# Patient Record
Sex: Female | Born: 1965 | Race: White | Hispanic: No | Smoking: Former smoker
Health system: Southern US, Community
[De-identification: ages and names within clinical notes are randomized; demographics above are authoritative.]

## PROBLEM LIST (undated history)

## (undated) DIAGNOSIS — H539 Unspecified visual disturbance: Secondary | ICD-10-CM

## (undated) DIAGNOSIS — G35 Multiple sclerosis: Secondary | ICD-10-CM

## (undated) DIAGNOSIS — G629 Polyneuropathy, unspecified: Secondary | ICD-10-CM

## (undated) DIAGNOSIS — I10 Essential (primary) hypertension: Secondary | ICD-10-CM

## (undated) DIAGNOSIS — I639 Cerebral infarction, unspecified: Secondary | ICD-10-CM

## (undated) HISTORY — PX: CHOLECYSTECTOMY: SHX55

## (undated) HISTORY — PX: ABDOMINAL HYSTERECTOMY: SHX81

## (undated) HISTORY — PX: APPENDECTOMY: SHX54

## (undated) HISTORY — DX: Essential (primary) hypertension: I10

## (undated) HISTORY — PX: BREAST SURGERY: SHX581

## (undated) HISTORY — DX: Polyneuropathy, unspecified: G62.9

## (undated) HISTORY — DX: Unspecified visual disturbance: H53.9

## (undated) HISTORY — PX: TONSILLECTOMY: SUR1361

## (undated) HISTORY — DX: Cerebral infarction, unspecified: I63.9

---

## 2000-05-03 ENCOUNTER — Emergency Department (HOSPITAL_COMMUNITY): Admission: EM | Admit: 2000-05-03 | Discharge: 2000-05-03 | Payer: Self-pay | Admitting: *Deleted

## 2007-12-11 ENCOUNTER — Ambulatory Visit (HOSPITAL_COMMUNITY): Admission: RE | Admit: 2007-12-11 | Discharge: 2007-12-11 | Payer: Self-pay | Admitting: *Deleted

## 2007-12-11 ENCOUNTER — Encounter (INDEPENDENT_AMBULATORY_CARE_PROVIDER_SITE_OTHER): Payer: Self-pay | Admitting: *Deleted

## 2008-08-16 ENCOUNTER — Emergency Department (HOSPITAL_BASED_OUTPATIENT_CLINIC_OR_DEPARTMENT_OTHER): Admission: EM | Admit: 2008-08-16 | Discharge: 2008-08-16 | Payer: Self-pay | Admitting: Emergency Medicine

## 2008-11-23 ENCOUNTER — Emergency Department (HOSPITAL_BASED_OUTPATIENT_CLINIC_OR_DEPARTMENT_OTHER): Admission: EM | Admit: 2008-11-23 | Discharge: 2008-11-23 | Payer: Self-pay | Admitting: Emergency Medicine

## 2011-02-14 LAB — URINE MICROSCOPIC-ADD ON

## 2011-02-14 LAB — COMPREHENSIVE METABOLIC PANEL
AST: 37 U/L (ref 0–37)
BUN: 17 mg/dL (ref 6–23)
CO2: 24 mEq/L (ref 19–32)
Calcium: 9 mg/dL (ref 8.4–10.5)
Chloride: 103 mEq/L (ref 96–112)
Creatinine, Ser: 0.7 mg/dL (ref 0.4–1.2)
GFR calc non Af Amer: >60 mL/min (ref >60)
Glucose, Bld: 115 mg/dL — ABNORMAL HIGH (ref 70–99)
Total Bilirubin: 1.2 mg/dL (ref 0.3–1.2)

## 2011-02-14 LAB — URINALYSIS, ROUTINE W REFLEX MICROSCOPIC
Ketones, ur: 40 mg/dL — AB
Nitrite: NEGATIVE
Protein, ur: 30 mg/dL — AB
pH: 8 (ref 5.0–8.0)

## 2011-02-14 LAB — CBC
HCT: 43 % (ref 36.0–46.0)
Hemoglobin: 14.5 g/dL (ref 12.0–15.0)
MCHC: 33.7 g/dL (ref 30.0–36.0)
MCV: 97.1 fL (ref 78.0–100.0)
RBC: 4.43 MIL/uL (ref 3.87–5.11)
WBC: 7.3 10*3/uL (ref 4.0–10.5)

## 2011-02-14 LAB — URINE CULTURE: Colony Count: 8000

## 2011-02-14 LAB — DIFFERENTIAL
Basophils Absolute: 0.2 10*3/uL — ABNORMAL HIGH (ref 0.0–0.1)
Eosinophils Relative: 1 % (ref 0–5)
Lymphocytes Relative: 5 % — ABNORMAL LOW (ref 12–46)
Neutrophils Relative %: 89 % — ABNORMAL HIGH (ref 43–77)

## 2011-03-15 NOTE — Op Note (Signed)
Veronica Frazier, Veronica Frazier              ACCOUNT NO.:  [id number]   MEDICAL RECORD NO.:  [id number]          PATIENT TYPE:  AMB   LOCATION:  ENDO                         FACILITY:  [facility]   PHYSICIAN:  Georgiana Spinner, M.D.    DATE OF BIRTH:  1966-01-04   DATE OF PROCEDURE:  12/11/2007  DATE OF DISCHARGE:                               OPERATIVE REPORT   PROCEDURE PERFORMED:  Upper endoscopy.   INDICATIONS:  Gastroesophageal reflux disease.   ANESTHESIA:  Fentanyl 50 mcg and Versed 4 mg.   PROCEDURE:  With the patient mildly sedated in the left lateral  decubitus position, the Pentax videoscopic endoscope was inserted in the  mouth and passed under direct vision through the esophagus which  appeared normal until we reached the distal esophagus and there appeared  to be one short area of Barrett's, photographed and biopsied.  We  subsequently entered into the stomach.  The fundus, body, antrum,  duodenal bulb, and second portion of the duodenum appeared normal. From  this point, the endoscope was slowly withdrawn taking circumferential  views of the duodenal mucosa until the endoscope had been pulled back  into the stomach and placed in retroflexion to view the stomach from  below. The endoscope was straightened and withdrawn taking  circumferential views of the remaining gastric and esophageal mucosa.  The patient's vital signs and pulse oximeter remained stable.  The  patient tolerated the procedure well without apparent complications.   FINDINGS:  Question of Barrett's esophagus versus normal variant under  distended, await biopsy report.  The patient will call me for results  and follow up with me as an outpatient.  Proceed to colonoscopy.           ______________________________  Georgiana Spinner, M.D.     GMO/MEDQ  D:  12/11/2007  T:  12/12/2007  Job:  [id number]

## 2011-03-15 NOTE — Op Note (Signed)
Veronica Frazier, Veronica Frazier              ACCOUNT NO.:  [id number]   MEDICAL RECORD NO.:  [id number]          PATIENT TYPE:  AMB   LOCATION:  ENDO                         FACILITY:  [facility]   PHYSICIAN:  Georgiana Spinner, M.D.    DATE OF BIRTH:  [birth year]   DATE OF PROCEDURE:  12/11/2007  DATE OF DISCHARGE:                               OPERATIVE REPORT   PROCEDURE:  Colonoscopy.   INDICATIONS:  Colon cancer screening, rectal bleeding.   ANESTHESIA:  Fentanyl 50 mcg, Versed 3 mg.   PROCEDURE:  With the patient mildly sedated in the left lateral  decubitus position, the Pentax videoscopic colonoscope was inserted in  the rectum and passed under direct vision to the cecum, identified by  ileocecal valve and appendiceal orifice, both of which were  photographed.  From this point colonoscope was slowly withdrawn taking  circumferential views of colonic mucosa, stopping only in the rectum,  which appeared normal on direct and retroflexed view.  The endoscope was  straightened and withdrawn.  The patient's vital signs and pulse  oximetry remained stable.  The patient tolerated the procedure well  without apparent complications.   FINDINGS:  Negative colonoscopic examination.   PLAN:  Have the patient follow up with me as noted in endoscopic report  and possibly in 5 years as needed.           ______________________________  Georgiana Spinner, M.D.     GMO/MEDQ  D:  12/11/2007  T:  12/12/2007  Job:  [id number]   cc:   Clint Lipps, MD  [facility], [place]

## 2011-05-28 ENCOUNTER — Emergency Department (INDEPENDENT_AMBULATORY_CARE_PROVIDER_SITE_OTHER): Payer: BC Managed Care – PPO

## 2011-05-28 ENCOUNTER — Encounter: Payer: Self-pay | Admitting: *Deleted

## 2011-05-28 ENCOUNTER — Emergency Department (HOSPITAL_BASED_OUTPATIENT_CLINIC_OR_DEPARTMENT_OTHER)
Admission: EM | Admit: 2011-05-28 | Discharge: 2011-05-28 | Disposition: A | Discharge status: Home / Self Care | Payer: BC Managed Care – PPO | Attending: Emergency Medicine | Admitting: Emergency Medicine

## 2011-05-28 DIAGNOSIS — X500XXA Overexertion from strenuous movement or load, initial encounter: Secondary | ICD-10-CM | POA: Insufficient documentation

## 2011-05-28 DIAGNOSIS — R209 Unspecified disturbances of skin sensation: Secondary | ICD-10-CM | POA: Insufficient documentation

## 2011-05-28 DIAGNOSIS — R609 Edema, unspecified: Secondary | ICD-10-CM

## 2011-05-28 DIAGNOSIS — G35 Multiple sclerosis: Secondary | ICD-10-CM | POA: Insufficient documentation

## 2011-05-28 DIAGNOSIS — S93409A Sprain of unspecified ligament of unspecified ankle, initial encounter: Secondary | ICD-10-CM | POA: Insufficient documentation

## 2011-05-28 DIAGNOSIS — M25579 Pain in unspecified ankle and joints of unspecified foot: Secondary | ICD-10-CM

## 2011-05-28 HISTORY — DX: Multiple sclerosis: G35

## 2011-05-28 NOTE — ED Notes (Signed)
Pt states she fell off sidewalk 2 days ago injuring her left ankle. Today stepped in hole and reinjured it.

## 2011-05-28 NOTE — ED Provider Notes (Signed)
History     Chief Complaint  Patient presents with  . Ankle Pain   Patient is a 45 y.o. female presenting with ankle pain. The history is provided by the patient.  Ankle Pain  The incident occurred at home. Associated symptoms include numbness.  patient rolled her left ankle 2 days ago an again today. Pain with walking. No other injuries. She states that she has some numbness in the ankle. Skin intact.   Past Medical History  Diagnosis Date  . Multiple sclerosis     Past Surgical History  Procedure Date  . Abdominal hysterectomy   . Cholecystectomy   . Appendectomy   . Tonsillectomy   . Breast surgery     History reviewed. No pertinent family history.  History  Substance Use Topics  . Smoking status: Never Smoker   . Smokeless tobacco: Not on file  . Alcohol Use: No    OB History    Grav Para Term Preterm Abortions TAB SAB Ect Mult Living                  Review of Systems  Constitutional: Negative for fever and fatigue.  Musculoskeletal:       Ankle injury  Neurological: Positive for numbness. Negative for weakness.    Physical Exam  BP 134/94  Pulse 102  Temp(Src) 98.9 F (37.2 C) (Oral)  Resp 20  Ht 5\' 7"  (1.702 m)  Wt 185 lb (83.915 kg)  BMI 28.97 kg/m2  SpO2 96%  Physical Exam  Vitals reviewed. Constitutional: She appears well-developed and well-nourished.  HENT:  Head: Normocephalic.  Musculoskeletal: Normal range of motion.       Left ankle swelling posterior to lateral maleolus. Skin intact. Not tenderness over head of fibula or base of 5ht metacarpal.   Neurological: She is alert.       Decreased sensation over left ankle laterally and posteriorly.   Skin: Skin is warm.    ED Course  Procedures    Dg Ankle Complete Left  05/28/2011  *RADIOLOGY REPORT*  Clinical Data: Twisting injury.  Pain.  LEFT ANKLE COMPLETE - 3+ VIEW  Comparison: None  Findings: There is lateral soft tissue swelling.  No evidence of fracture or dislocation.   There is a tiny ossicle inferior to the medial malleolus that it is not acute.  IMPRESSION: Lateral soft tissue swelling.  No acute bony injury.  Original Report Authenticated By: Thomasenia Sales, M.D.    MDM Ankle sprain. Xray negative. ASO and ortho follow up as needed. Patient states that she does not need pain meds.       Juliet Rude. Rubin Payor, MD 05/28/11 1739

## 2011-06-11 ENCOUNTER — Encounter (HOSPITAL_BASED_OUTPATIENT_CLINIC_OR_DEPARTMENT_OTHER): Payer: Self-pay | Admitting: *Deleted

## 2011-06-11 ENCOUNTER — Emergency Department (HOSPITAL_BASED_OUTPATIENT_CLINIC_OR_DEPARTMENT_OTHER)
Admission: EM | Admit: 2011-06-11 | Discharge: 2011-06-11 | Disposition: A | Discharge status: Home / Self Care | Payer: BC Managed Care – PPO | Attending: Emergency Medicine | Admitting: Emergency Medicine

## 2011-06-11 ENCOUNTER — Emergency Department (INDEPENDENT_AMBULATORY_CARE_PROVIDER_SITE_OTHER): Payer: BC Managed Care – PPO

## 2011-06-11 DIAGNOSIS — G35 Multiple sclerosis: Secondary | ICD-10-CM

## 2011-06-11 DIAGNOSIS — R51 Headache: Secondary | ICD-10-CM | POA: Insufficient documentation

## 2011-06-11 DIAGNOSIS — R6883 Chills (without fever): Secondary | ICD-10-CM

## 2011-06-11 DIAGNOSIS — R112 Nausea with vomiting, unspecified: Secondary | ICD-10-CM | POA: Insufficient documentation

## 2011-06-11 LAB — COMPREHENSIVE METABOLIC PANEL
ALT: 45 U/L — ABNORMAL HIGH (ref 0–35)
Alkaline Phosphatase: 87 U/L (ref 39–117)
CO2: 24 mEq/L (ref 19–32)
Chloride: 95 mEq/L — ABNORMAL LOW (ref 96–112)
GFR calc Af Amer: >60 mL/min (ref >60)
GFR calc non Af Amer: >60 mL/min (ref >60)
Glucose, Bld: 135 mg/dL — ABNORMAL HIGH (ref 70–99)
Potassium: 4.4 mEq/L (ref 3.5–5.1)
Sodium: 132 mEq/L — ABNORMAL LOW (ref 135–145)

## 2011-06-11 LAB — CBC
Platelets: 169 10*3/uL (ref 150–400)
RBC: 4.73 MIL/uL (ref 3.87–5.11)
WBC: 5.2 10*3/uL (ref 4.0–10.5)

## 2011-06-11 LAB — DIFFERENTIAL
Lymphocytes Relative: 22 % (ref 12–46)
Lymphs Abs: 1.1 10*3/uL (ref 0.7–4.0)
Neutrophils Relative %: 69 % (ref 43–77)

## 2011-06-11 LAB — URINALYSIS, ROUTINE W REFLEX MICROSCOPIC
Bilirubin Urine: NEGATIVE
Ketones, ur: NEGATIVE mg/dL
Nitrite: NEGATIVE
Urobilinogen, UA: 0.2 mg/dL (ref 0.0–1.0)
pH: 6 (ref 5.0–8.0)

## 2011-06-11 MED ORDER — SODIUM CHLORIDE 0.9 % IV SOLN
999.0000 mL | Freq: Once | INTRAVENOUS | Status: AC
  Administered 2011-06-11: 999 mL via INTRAVENOUS

## 2011-06-11 MED ORDER — METOCLOPRAMIDE HCL 5 MG/ML IJ SOLN
10.0000 mg | Freq: Once | INTRAMUSCULAR | Status: AC
  Administered 2011-06-11: 10 mg via INTRAVENOUS
  Filled 2011-06-11: qty 2

## 2011-06-11 MED ORDER — SODIUM CHLORIDE 0.9 % IV SOLN
999.0000 mL | Freq: Once | INTRAVENOUS | Status: AC
  Administered 2011-06-11: 1000 mL via INTRAVENOUS

## 2011-06-11 NOTE — ED Provider Notes (Signed)
History     CSN: [id number] Arrival date & time: 06/11/2011  3:42 AM  Chief Complaint  Patient presents with  . Emesis   HPI Complaints of multiple episodes of vomiting and frontal headache onset 3 days ago treated with Excedrin Migraine last dose 2 AM today with partial relief. maximum temperature 101 yesterday denies abdominal pain denies blurred vision also complains of slight low back pain no other complaints nothing makes symptoms worse symptoms slightly improved with Excedrin Migraine Past Medical History  Diagnosis Date  . Multiple sclerosis     Past Surgical History  Procedure Date  . Abdominal hysterectomy   . Cholecystectomy   . Appendectomy   . Tonsillectomy   . Breast surgery    breast reduction  No family history on file.  History  Substance Use Topics  . Smoking status: Never Smoker   . Smokeless tobacco: Not on file  . Alcohol Use: No    OB History    Grav Para Term Preterm Abortions TAB SAB Ect Mult Living                  Review of Systems  Constitutional: Negative.   HENT: Negative.   Respiratory: Negative.   Cardiovascular: Negative.   Gastrointestinal: Negative.   Musculoskeletal: Positive for back pain.  Skin: Negative.   Neurological: Positive for numbness.       Headache, chronic numbness and weakness in left upper and left lower extremities  Hematological: Negative.   Psychiatric/Behavioral: Negative.     Physical Exam  BP 116/78  Pulse 110  Temp(Src) 99.3 F (37.4 C) (Oral)  Resp 19  SpO2 98%  Physical Exam  Constitutional: She appears well-developed and well-nourished.  HENT:  Head: Normocephalic and atraumatic.  Eyes: Conjunctivae are normal. Pupils are equal, round, and reactive to light.       Fundi not well visualized  Neck: Neck supple. No tracheal deviation present. No thyromegaly present.       No signs of meningitis  Cardiovascular: Normal rate and regular rhythm.   No murmur heard. Pulmonary/Chest: Effort  normal and breath sounds normal.  Abdominal: Soft. Bowel sounds are normal. She exhibits no distension. There is no tenderness.  Musculoskeletal: Normal range of motion. She exhibits no edema and no tenderness.       Wears Velcro splints on left wrist and left ankle  Lymphadenopathy:    She has no cervical adenopathy.  Neurological: She is alert. She has normal reflexes. Coordination normal.       Walks with minimal limp favoring left lower extremity  Skin: Skin is warm and dry. No rash noted.  Psychiatric: She has a normal mood and affect.    ED Course  Procedures  MDM No signs of meningitis on physical exam at 6:15 AM patient feels improved is able to drink without nausea or vomiting plan followup PMD as needed      Doug Sou, MD 06/11/11 [phone]

## 2011-06-11 NOTE — ED Notes (Signed)
NS bolus complete. Pt lying in bed sleeping but rouses to voice. Pt is in NAD. Awaiting MD eval.

## 2011-06-11 NOTE — ED Notes (Signed)
Pt c/o N/V, HA and chills x3 days.

## 2011-06-13 ENCOUNTER — Encounter (HOSPITAL_BASED_OUTPATIENT_CLINIC_OR_DEPARTMENT_OTHER): Payer: Self-pay | Admitting: *Deleted

## 2011-06-13 ENCOUNTER — Emergency Department (HOSPITAL_BASED_OUTPATIENT_CLINIC_OR_DEPARTMENT_OTHER)
Admission: EM | Admit: 2011-06-13 | Discharge: 2011-06-13 | Disposition: A | Discharge status: Home / Self Care | Payer: BC Managed Care – PPO | Attending: Emergency Medicine | Admitting: Emergency Medicine

## 2011-06-13 ENCOUNTER — Emergency Department (INDEPENDENT_AMBULATORY_CARE_PROVIDER_SITE_OTHER): Payer: BC Managed Care – PPO

## 2011-06-13 DIAGNOSIS — B09 Unspecified viral infection characterized by skin and mucous membrane lesions: Secondary | ICD-10-CM | POA: Insufficient documentation

## 2011-06-13 DIAGNOSIS — R112 Nausea with vomiting, unspecified: Secondary | ICD-10-CM

## 2011-06-13 DIAGNOSIS — R509 Fever, unspecified: Secondary | ICD-10-CM

## 2011-06-13 DIAGNOSIS — R05 Cough: Secondary | ICD-10-CM

## 2011-06-13 DIAGNOSIS — E860 Dehydration: Secondary | ICD-10-CM

## 2011-06-13 DIAGNOSIS — G35 Multiple sclerosis: Secondary | ICD-10-CM | POA: Insufficient documentation

## 2011-06-13 DIAGNOSIS — R51 Headache: Secondary | ICD-10-CM

## 2011-06-13 DIAGNOSIS — R5381 Other malaise: Secondary | ICD-10-CM

## 2011-06-13 LAB — URINALYSIS, ROUTINE W REFLEX MICROSCOPIC
Leukocytes, UA: NEGATIVE
Nitrite: NEGATIVE
Specific Gravity, Urine: 1.013 (ref 1.005–1.030)
pH: 6.5 (ref 5.0–8.0)

## 2011-06-13 LAB — DIFFERENTIAL
Eosinophils Absolute: 0.1 10*3/uL (ref 0.0–0.7)
Eosinophils Relative: 1 % (ref 0–5)
Lymphocytes Relative: 31 % (ref 12–46)
Monocytes Absolute: 0.4 10*3/uL (ref 0.1–1.0)
Neutrophils Relative %: 60 % (ref 43–77)

## 2011-06-13 LAB — COMPREHENSIVE METABOLIC PANEL
AST: 56 U/L — ABNORMAL HIGH (ref 0–37)
Albumin: 3.6 g/dL (ref 3.5–5.2)
Calcium: 10 mg/dL (ref 8.4–10.5)
Creatinine, Ser: 0.7 mg/dL (ref 0.50–1.10)

## 2011-06-13 LAB — CBC
MCH: 32.9 pg (ref 26.0–34.0)
MCHC: 34.3 g/dL (ref 30.0–36.0)
Platelets: 163 10*3/uL (ref 150–400)
RBC: 4.28 MIL/uL (ref 3.87–5.11)

## 2011-06-13 LAB — RAPID STREP SCREEN (MED CTR MEBANE ONLY): Streptococcus, Group A Screen (Direct): NEGATIVE

## 2011-06-13 LAB — LACTIC ACID, PLASMA: Lactic Acid, Venous: 0.9 mmol/L (ref 0.5–2.2)

## 2011-06-13 MED ORDER — SODIUM CHLORIDE 0.9 % IV SOLN
INTRAVENOUS | Status: DC
  Administered 2011-06-13: 03:00:00 via INTRAVENOUS

## 2011-06-13 MED ORDER — PROMETHAZINE HCL 25 MG RE SUPP: 25.0000 mg | Freq: Four times a day (QID) | RECTAL | Status: AC | PRN

## 2011-06-13 MED ORDER — OXYCODONE-ACETAMINOPHEN 5-325 MG PO TABS: 1.0000 | ORAL_TABLET | ORAL | Status: AC | PRN

## 2011-06-13 MED ORDER — ACETAMINOPHEN 500 MG PO TABS
1000.0000 mg | ORAL_TABLET | Freq: Once | ORAL | Status: AC
  Administered 2011-06-13: 1000 mg via ORAL
  Filled 2011-06-13: qty 2

## 2011-06-13 MED ORDER — ONDANSETRON HCL 4 MG/2ML IJ SOLN
4.0000 mg | Freq: Once | INTRAMUSCULAR | Status: AC
  Administered 2011-06-13: 4 mg via INTRAVENOUS
  Filled 2011-06-13: qty 2

## 2011-06-13 MED ORDER — ONDANSETRON HCL 4 MG PO TABS: 4.0000 mg | ORAL_TABLET | Freq: Four times a day (QID) | ORAL | Status: AC

## 2011-06-13 MED ORDER — SODIUM CHLORIDE 0.9 % IV SOLN
Freq: Once | INTRAVENOUS | Status: AC
  Administered 2011-06-13: 02:00:00 via INTRAVENOUS

## 2011-06-13 MED ORDER — PROMETHAZINE HCL 25 MG PO TABS: 25.0000 mg | ORAL_TABLET | Freq: Four times a day (QID) | ORAL | Status: AC | PRN

## 2011-06-13 MED ORDER — MORPHINE SULFATE 4 MG/ML IJ SOLN
4.0000 mg | Freq: Once | INTRAMUSCULAR | Status: AC
  Administered 2011-06-13: 4 mg via INTRAVENOUS
  Filled 2011-06-13: qty 1

## 2011-06-13 NOTE — ED Notes (Signed)
Pt states she was here Friday with similar s/s and had a CT scan and IV. Now has developed cough and feels worse.

## 2011-06-13 NOTE — ED Provider Notes (Addendum)
History     CSN: [id number] Arrival date & time: 06/13/2011  1:36 AM  Chief Complaint  Patient presents with  . Fever   HPI 45 year old female presents from home with complaints of continuing headache, fever, myalgias and a cough. Patient was seen in the ER with headache and reported fever on Friday, 3 days ago. Patient had head CT and laboratories at that time treated with fluids. Patient reports she was feeling better, however the next day she felt too unwell to do anything other than being in bed. Patient reports she has had vomiting, decreased by mouth intake, hot and cold chills. Cough began yesterday is dry and nonproductive. The patient has been taking Tylenol for fever, last taken at 9:30 last night. Patient with recent exposure to viral illness in grand child and daughter. Both of them had fever followed by a rash. Patient reports she noticed onset of blotchy type rash today. Patient with significant history of multiple sclerosis, is on interferon and steroids for same. Patient denies any neck stiffness, no new weakness or numbness, no diarrhea. No urinary symptoms. No abdominal pain. Patient reports slight worsening of her chronic low back pain. Patient reports muscle aches throughout her body. Patient denies any recent tick bites or other known infectious exposures.  Past Medical History  Diagnosis Date  . Multiple sclerosis     Past Surgical History  Procedure Date  . Abdominal hysterectomy   . Cholecystectomy   . Appendectomy   . Tonsillectomy   . Breast surgery     History reviewed. No pertinent family history.  History  Substance Use Topics  . Smoking status: Never Smoker   . Smokeless tobacco: Not on file  . Alcohol Use: No    OB History    Grav Para Term Preterm Abortions TAB SAB Ect Mult Living                  Review of Systems Review of systems negative other than listed in history of present illness  Physical Exam  BP 126/64  Pulse 86  Temp(Src)  100.2 F (37.9 C) (Oral)  Resp 15  Ht 5\' 7"  (1.702 m)  Wt 192 lb (87.091 kg)  BMI 30.07 kg/m2  SpO2 94%  Physical Exam  Nursing note and vitals reviewed. Constitutional: She is oriented to person, place, and time. She appears well-developed. She appears distressed.       Obese, appears to be uncomfortable, anxious  HENT:  Head: Normocephalic and atraumatic.  Right Ear: External ear normal.  Left Ear: External ear normal.  Nose: Nose normal.  Mouth/Throat: Oropharynx is clear and moist.       Patient reports tenderness to palpation of forehead and cheeks bilaterally  Eyes: Conjunctivae and EOM are normal. Pupils are equal, round, and reactive to light. Right eye exhibits no discharge. Left eye exhibits no discharge. No scleral icterus.  Neck: Normal range of motion. Neck supple. No JVD present. No tracheal deviation present. No thyromegaly present.  Cardiovascular: Regular rhythm, normal heart sounds and intact distal pulses.  Exam reveals no gallop and no friction rub.   No murmur heard.      Tachycardia noted. Patient noted to be orthostatic with vitals.  Pulmonary/Chest: Effort normal and breath sounds normal. No stridor. No respiratory distress. She has no wheezes. She has no rales. She exhibits no tenderness.       Dry nonproductive cough noted  Abdominal: Soft. Bowel sounds are normal. She exhibits no distension and no  mass. There is no tenderness. There is no rebound and no guarding.  Musculoskeletal: Normal range of motion. She exhibits tenderness. She exhibits no edema.       Patient with diffuse tenderness to palpation over entire body  Lymphadenopathy:    She has no cervical adenopathy.  Neurological: She is alert and oriented to person, place, and time. She has normal reflexes. She displays normal reflexes. No cranial nerve deficit. She exhibits normal muscle tone. Coordination normal.  Skin: Skin is warm and dry. Rash noted.       Diffuse maculopapular rash noted 2 chest,  arms  Psychiatric:       Anxious, agitated    ED Course  Procedures  Results for orders placed during the hospital encounter of 06/13/11  LACTIC ACID, PLASMA      Component Value Range   Lactic Acid, Venous 0.9  0.5 - 2.2 (mmol/L)  CBC      Component Value Range   WBC 5.3  4.0 - 10.5 (K/uL)   RBC 4.28  3.87 - 5.11 (MIL/uL)   Hemoglobin 14.1  12.0 - 15.0 (g/dL)   HCT 16.1  09.6 - 04.5 (%)   MCV 96.0  78.0 - 100.0 (fL)   MCH 32.9  26.0 - 34.0 (pg)   MCHC 34.3  30.0 - 36.0 (g/dL)   RDW 40.9  81.1 - 91.4 (%)   Platelets 163  150 - 400 (K/uL)  DIFFERENTIAL      Component Value Range   Neutrophils Relative 60  43 - 77 (%)   Lymphocytes Relative 31  12 - 46 (%)   Monocytes Relative 8  3 - 12 (%)   Eosinophils Relative 1  0 - 5 (%)   Basophils Relative 0  0 - 1 (%)   Neutro Abs 3.2  1.7 - 7.7 (K/uL)   Lymphs Abs 1.6  0.7 - 4.0 (K/uL)   Monocytes Absolute 0.4  0.1 - 1.0 (K/uL)   Eosinophils Absolute 0.1  0.0 - 0.7 (K/uL)   Basophils Absolute 0.0  0.0 - 0.1 (K/uL)   WBC Morphology ATYPICAL LYMPHOCYTES     Smear Review LARGE PLATELETS PRESENT    COMPREHENSIVE METABOLIC PANEL      Component Value Range   Sodium 136  135 - 145 (mEq/L)   Potassium 3.8  3.5 - 5.1 (mEq/L)   Chloride 97  96 - 112 (mEq/L)   CO2 27  19 - 32 (mEq/L)   Glucose, Bld 107 (*) 70 - 99 (mg/dL)   BUN 11  6 - 23 (mg/dL)   Creatinine, Ser 7.82  0.50 - 1.10 (mg/dL)   Calcium 95.6  8.4 - 10.5 (mg/dL)   Total Protein 6.9  6.0 - 8.3 (g/dL)   Albumin 3.6  3.5 - 5.2 (g/dL)   AST 56 (*) 0 - 37 (U/L)   ALT 58 (*) 0 - 35 (U/L)   Alkaline Phosphatase 88  39 - 117 (U/L)   Total Bilirubin 0.5  0.3 - 1.2 (mg/dL)   GFR calc non Af Amer >60  >60 (mL/min)   GFR calc Af Amer >60  >60 (mL/min)  LIPASE, BLOOD      Component Value Range   Lipase 32  11 - 59 (U/L)  RAPID STREP SCREEN      Component Value Range   Streptococcus, Group A Screen (Direct) NEGATIVE  NEGATIVE   URINALYSIS, ROUTINE W REFLEX MICROSCOPIC       Component Value Range   Color, Urine YELLOW  YELLOW    Appearance CLEAR  CLEAR    Specific Gravity, Urine 1.013  1.005 - 1.030    pH 6.5  5.0 - 8.0    Glucose, UA NEGATIVE  NEGATIVE (mg/dL)   Hgb urine dipstick NEGATIVE  NEGATIVE    Bilirubin Urine NEGATIVE  NEGATIVE    Ketones, ur NEGATIVE  NEGATIVE (mg/dL)   Protein, ur NEGATIVE  NEGATIVE (mg/dL)   Urobilinogen, UA 0.2  0.0 - 1.0 (mg/dL)   Nitrite NEGATIVE  NEGATIVE    Leukocytes, UA NEGATIVE  NEGATIVE    Dg Chest 2 View  06/13/2011  *RADIOLOGY REPORT*  Clinical Data: Fever, cough, headache, weakness  CHEST - 2 VIEW  Comparison: None.  Findings: The heart size and pulmonary vascularity are normal. The lungs appear clear and expanded without focal air space disease or consolidation. No blunting of the costophrenic angles.  IMPRESSION: No evidence of active pulmonary disease.  Original Report Authenticated By: Marlon Pel, M.D.       MDM 45 year old female with fever, rash with recent exposure to favor his with same. Patient seen for fever and headache on Friday with normal workup at that time. Feel that this is most likely a viral exanthem given her history, but with history of chronic steroid use and interferon will get blood cultures, lactate, chest x-ray, repeat of labs give pain and nausea medicines, antipyretics, and IV fluids. If workup negative will DC to home with nausea medicine and close followup with her primary care doctor.      Olivia Mackie, MD 06/13/11 [id number]  Olivia Mackie, MD 06/13/11 [phone]

## 2011-06-13 NOTE — Discharge Instructions (Signed)
Please alternate tylenol and motrin every 4-6 hours for fever, body aches.  Take nausea medications as needed.  Stick to a bland diet.  Return to the ER for worsening condition or new concerning symptoms.  Follow up with your doctor for recheck in 3-5 days.    B.R.A.T. Diet Your doctor has recommended the B.R.A.T. diet for you or your child until the condition improves. This is often used to help control diarrhea and vomiting symptoms. If you or your child can tolerate clear liquids, you may have:  Bananas.   Rice.   Applesauce.   Toast (and other simple starches such as crackers, potatoes, noodles).  Be sure to avoid dairy products, meats, and fatty foods until symptoms are better. Fruit juices such as apple, grape, and prune juice can make diarrhea worse. Avoid these. Continue this diet for 2 days or as instructed by your caregiver. Document Released: 10/17/2005 Document Re-Released: 08/14/2007 [facility] Patient Information 2011 [place], [place].  Dehydration - Adult Dehydration is the reduction of water and fluid from the body to a level below that required for proper functioning. CAUSES  Throwing up (vomiting).  Diarrhea.   Heat illness.   Fever.   Infections.   Not drinking enough water.  Drinking too much alcohol.   Diabetes.   Use of water pills (diuretics).   SYMPTOMS   Dry mouth.   Thick saliva.   Dizziness.   Weakness.   Fainting.   Decreased urine.  TREATMENT  When dehydration is severe, fluids may be given by intravenous (IV) access or stomach tube.   Medicines to control vomiting or diarrhea may be given.   Sometimes, hospital care is needed to replace water and salt in the body.  Untreated dehydration can lead to shock, severe kidney damage, and death. HOME CARE INSTRUCTIONS  Drink enough water and fluids to keep your urine clear or pale yellow. Ice chips, frozen ice pops, sports drinks, pops or sodas, and fruit juices can all be helpful.    Rest.   Medicine may be needed to control fever or water losses. Only take medicine as directed by your caregiver.   Once your water and salt balance is restored, a regular diet should keep you from getting dehydrated.  SEEK MEDICAL CARE IF:  You have repeated vomiting or diarrhea, severe weakness, or fainting.   You have an oral temperature above 103.   You have belly (abdominal) or chest pain, bloating, or bleeding problems.  MAKE SURE YOU:  Understand these instructions.   Will watch your condition.   Will get help right away if you are not doing well or get worse.  Document Released: 10/17/2005 Document Re-Released: 01/11/2010 [facility] Patient Information 2011 [place], [place].  Viral Exanthems, Adult Many viral infections of the skin are called viral exanthems. Exanthem is another name for a rash or skin eruption. The most common viral exanthem include the following:  [place] measles or rubella.   Measles or rubeola.   Roseola.   Fifth disease.   Chickenpox or varicella.  DIAGNOSIS There are a number of other problems causing a rash that may appear as one of the viral exanthems. Most often your caregiver can determine this by looking at the rash. They have distinct patterns and looks. Lab work may or may not be done if the diagnosis is uncertain. TREATMENT Immunizations have lead to a decrease in the number of cases of measles, mumps and rubella. Viral exanthems may require clinical care by a physician or other caregiver  if other problems or a germ (bacterial) infection follow. The rash may be associated with:  Minor sore throats.   Aches and pains.   Runny noses.   Watery eyes.   Tiredness.   Some coughs.   Gastrointestinal infections causing stomach sickness (nausea), vomiting and diarrhea.  These rashes DO NOT respond to medications that kill germs (antibiotics). HOME CARE INSTRUCTIONS  Only take over-the-counter or prescription medicines for pain,  discomfort, diarrhea or fever as directed by your caregiver.   Drink a lot of clear fluids.  GET HELP IF:  You develop swollen neck glands.   You have an oral temperature above 103.   You develop tenderness over your sinuses.   You are not feeling partly better after 3 days.   You develop muscle aches.   You are feeling more tired than you would expect.   You get a persistent cough with mucus.  SEEK IMMEDIATE MEDICAL CARE IF:  You have an oral temperature above 103, not controlled by medicine.   You develop a sore throat with pus and difficulty swallowing.   You develop neck pain.   You develop a severe headache.   You develop vomiting that will not stop.  Document Released: 01/07/2003 Document Re-Released: 10/05/2009 [facility] Patient Information 2011 [place], [place]  .Fever  Fever is a higher-than-normal body temperature. A normal temperature varies with:  Age.   How it is measured (mouth, underarm, rectal, or ear).   Time of day.  In an adult, an oral temperature around 98.6 Fahrenheit (F) or 37 Celsius (C) is considered normal. A rise in temperature of about 1.8 F or 1 C is generally considered a fever (100.4 F or 38 C). In an infant age 19 days or less, a rectal temperature of 100.33F (38C) generally is regarded as fever. Fever is not a disease but can be a symptom of illness. CAUSES  Fever is most commonly caused by infection.   Some non-infectious problems can cause fever. For example:   Some arthritis problems.   Problems with the thyroid or adrenal glands.   Immune system problems.   Some kinds of cancer.   A reaction to certain medicines.   Occasionally, the source of a fever cannot be determined. This is sometimes called a "Fever of Unknown Origin" (FUO).   Some situations may lead to a temporary rise in body temperature that may go away on its own. Examples are:   Childbirth.   Surgery.   Some situations may cause a rise in body  temperature but these are not considered true fever. Examples are:   Intense exercise.   Dehydration.   Exposure to high outside or room temperatures.  SYMPTOMS  Feeling warm or hot.   Fatigue or feeling exhausted.   Aching all over.   Chills.   Shivering.   Sweats.  DIAGNOSIS  A fever can be suspected by your caregiver feeling that your skin is unusually warm. The fever is confirmed by taking a temperature with a thermometer. Temperatures can be taken different ways. Some methods are accurate and some are not: With adults, adolescents, and children:   An oral temperature is used most commonly.   An ear thermometer will only be accurate if it is positioned as recommended by the manufacturer.   Under the arm temperatures are not accurate and not recommended.   Most electronic thermometers are fast and accurate.  Infants and Toddlers:  Rectal temperatures are recommended and most accurate.   Ear temperatures are  not accurate in this age group and are not recommended.   Skin thermometers are not accurate.  RISKS AND COMPLICATIONS  During a fever, the body uses more oxygen, so a person with a fever may develop rapid breathing or shortness of breath. This can be dangerous especially in people with heart or lung disease.   The sweats that occur following a fever can cause dehydration.   High fever can cause seizures in infants and children.   Older persons can develop confusion during a fever.  TREATMENT  Medications may be used to control temperature.   Do not give aspirin to children with fevers. There is an association with Reye's syndrome. Reyes syndrome is a rare but potentially deadly disease.   If an infection is present and medications have been prescribed, take them as directed. Finish the full course of medications until they are gone.   Sponging or bathing with room-temperature water may help reduce body temperature. Do not use ice water or alcohol  sponge baths.   Do not over-bundle children in blankets or heavy clothes.   Drinking adequate fluids during an illness with fever is important to prevent dehydration.  HOME CARE INSTRUCTIONS  For adults, rest and adequate fluid intake are important. Dress according to how you feel, but do not over-bundle.   Drink enough water and/or fluids to keep your urine clear or pale yellow.   For infants over 3 months and children, giving medication as directed by your caregiver to control fever can help with comfort. The amount to be given is based on the childs weight. Do NOT give more than is recommended.  SEEK MEDICAL CARE IF:  You or your child are unable to keep fluids down.   Vomiting or diarrhea develops.   You develop a skin rash.   An oral temperature above 103 develops, or a fever which persists for over 3 days.   You develop excessive weakness, dizziness, fainting or extreme thirst.   Fevers keep coming back after 3 days.  SEEK IMMEDIATE MEDICAL CARE IF:  Shortness of breath or trouble breathing develops   You pass out.   You feel you are making little or no urine.   New pain develops that was not there before (such as in the head, neck, chest, back, or abdomen).   You cannot hold down fluids.   Vomiting and diarrhea persist for more than a day or two.   You develop a stiff neck and/or your eyes become sensitive to light.   An unexplained temperature above 102 F (38.9 C) develops.  Document Released: 10/17/2005 Document Re-Released: 01/13/2009 [facility] Patient Information 2011 [place], [place].

## 2011-06-13 NOTE — ED Notes (Signed)
MD at bedside. 

## 2011-06-19 LAB — CULTURE, BLOOD (ROUTINE X 2): Culture: NO GROWTH

## 2012-08-08 ENCOUNTER — Emergency Department (HOSPITAL_BASED_OUTPATIENT_CLINIC_OR_DEPARTMENT_OTHER)
Admission: EM | Admit: 2012-08-08 | Discharge: 2012-08-08 | Disposition: A | Discharge status: Home / Self Care | Payer: BC Managed Care – PPO | Attending: Emergency Medicine | Admitting: Emergency Medicine

## 2012-08-08 DIAGNOSIS — M62830 Muscle spasm of back: Secondary | ICD-10-CM

## 2012-08-08 DIAGNOSIS — G35 Multiple sclerosis: Secondary | ICD-10-CM | POA: Insufficient documentation

## 2012-08-08 DIAGNOSIS — M5380 Other specified dorsopathies, site unspecified: Secondary | ICD-10-CM | POA: Insufficient documentation

## 2012-08-08 MED ORDER — CYCLOBENZAPRINE HCL 5 MG PO TABS: 5.0000 mg | ORAL_TABLET | Freq: Three times a day (TID) | ORAL | Status: DC | PRN

## 2012-08-08 NOTE — ED Notes (Signed)
Pt reports sharp pains to right upper back at her bra line. Reports having pain with inspiration and sharp shooting pains down right arm, no history of back problems. Pt does have history of MS. Injury occurred yesterday evening, took hydrocodone last pm for pain and got some relief, her main concern has been difficulty breathing secondary to pain

## 2012-08-08 NOTE — ED Provider Notes (Signed)
History     CSN: [id number]  Arrival date & time 08/08/12  1959   First MD Initiated Contact with Patient 08/08/12 2038      Chief Complaint  Patient presents with  . Back Pain    (Consider location/radiation/quality/duration/timing/severity/associated sxs/prior treatment) HPI CC: Back pain  Back pain: Started yesterday after pulling a large pup up camper. Right sided along bra strap area. Sharp in nature. Started noticing her breathing after the injury. Heating pad, hydrocodone, and sleeping pill last nightw/ improvement. Worsened today after being up on feet running errands. No h/o injury to affected area. Denies any SOB, URI symptoms, fever, n/v/d/c, rash.  Past Medical History  Diagnosis Date  . Multiple sclerosis     Past Surgical History  Procedure Date  . Abdominal hysterectomy   . Cholecystectomy   . Appendectomy   . Tonsillectomy   . Breast surgery     No family history on file.  History  Substance Use Topics  . Smoking status: Never Smoker   . Smokeless tobacco: Not on file  . Alcohol Use: No    OB History    Grav Para Term Preterm Abortions TAB SAB Ect Mult Living                  Review of Systems Per hpi Allergies  Review of patient's allergies indicates no known allergies.  Home Medications   Current Outpatient Rx  Name Route Sig Dispense Refill  . AMANTADINE HCL 100 MG PO CAPS Oral Take 100 mg by mouth 2 (two) times daily.      . ASPIRIN-ACETAMINOPHEN-CAFFEINE 250-250-65 MG PO TABS Oral Take 1 tablet by mouth 3 (three) times daily as needed.      . DULOXETINE HCL 60 MG PO CPEP Oral Take 60 mg by mouth daily.      Marland Kitchen FLUOXETINE HCL 40 MG PO CAPS Oral Take 40 mg by mouth daily.      Marland Kitchen GABAPENTIN 300 MG PO CAPS Oral Take 300 mg by mouth 4 (four) times daily.      . IBUPROFEN 800 MG PO TABS Oral Take 800 mg by mouth once as needed. For pain      . INTERFERON BETA-1A 30 MCG/0.5ML IM KIT Intramuscular Inject 30 mcg into the muscle every 7  (seven) days.      Marland Kitchen LIDOCAINE 5 % EX PTCH Transdermal Place 1 patch onto the skin daily. Remove & Discard patch within 12 hours or as directed by MD; for pain     . MELOXICAM 15 MG PO TABS Oral Take 15 mg by mouth daily.      Marland Kitchen MODAFINIL 200 MG PO TABS Oral Take 100 mg by mouth 2 (two) times daily.      Marygrace Drought WOMENS FORMULA PO Oral Take 1 tablet by mouth daily.        BP 130/81  Pulse 88  Temp 99 F (37.2 C) (Oral)  Resp 16  SpO2 99%  Physical Exam Gen: No distress, obese Musc: point tenderness over R midback just inferior to the scapula. Pain increased on deep inspiration, no effusions Neuro: CN grossly intact, cerebellar function intact  ED Course  Procedures (including critical care time)  Labs Reviewed - No data to display No results found.   No diagnosis found.    MDM  46yo obese female w/ muscle spasm of the R mid back.  -Flexeril RX given - Instructions for message, stretching, heat packs - Handout provided. - Pt to  f/u w/ PCP        Ozella Rocks, MD 08/08/12 2119

## 2012-08-08 NOTE — ED Notes (Signed)
Patient reports that she is having right sided back pain that started yesterday after helping her dad load a camper. Reports pain with inspiration and movement.

## 2012-08-09 NOTE — ED Provider Notes (Signed)
Medical screening examination/treatment/procedure(s) were performed by non-physician practitioner and as supervising physician I was immediately available for consultation/collaboration.    Orest Dygert L Rawan Riendeau, MD 08/09/12 1709 

## 2013-04-06 DIAGNOSIS — G35 Multiple sclerosis: Secondary | ICD-10-CM | POA: Insufficient documentation

## 2014-01-17 ENCOUNTER — Emergency Department (HOSPITAL_BASED_OUTPATIENT_CLINIC_OR_DEPARTMENT_OTHER)
Admission: EM | Admit: 2014-01-17 | Discharge: 2014-01-17 | Disposition: A | Discharge status: Home / Self Care | Payer: BC Managed Care – PPO | Attending: Emergency Medicine | Admitting: Emergency Medicine

## 2014-01-17 ENCOUNTER — Encounter (HOSPITAL_BASED_OUTPATIENT_CLINIC_OR_DEPARTMENT_OTHER): Payer: Self-pay | Admitting: Emergency Medicine

## 2014-01-17 DIAGNOSIS — Z8669 Personal history of other diseases of the nervous system and sense organs: Secondary | ICD-10-CM | POA: Insufficient documentation

## 2014-01-17 DIAGNOSIS — R1110 Vomiting, unspecified: Secondary | ICD-10-CM

## 2014-01-17 DIAGNOSIS — Z791 Long term (current) use of non-steroidal anti-inflammatories (NSAID): Secondary | ICD-10-CM | POA: Insufficient documentation

## 2014-01-17 DIAGNOSIS — R197 Diarrhea, unspecified: Secondary | ICD-10-CM | POA: Insufficient documentation

## 2014-01-17 DIAGNOSIS — Z79899 Other long term (current) drug therapy: Secondary | ICD-10-CM | POA: Insufficient documentation

## 2014-01-17 LAB — URINALYSIS, ROUTINE W REFLEX MICROSCOPIC
GLUCOSE, UA: NEGATIVE mg/dL
Hgb urine dipstick: NEGATIVE
KETONES UR: 15 mg/dL — AB
LEUKOCYTES UA: NEGATIVE
NITRITE: NEGATIVE
PH: 6.5 (ref 5.0–8.0)
Protein, ur: NEGATIVE mg/dL
SPECIFIC GRAVITY, URINE: 1.028 (ref 1.005–1.030)
Urobilinogen, UA: 1 mg/dL (ref 0.0–1.0)

## 2014-01-17 LAB — BASIC METABOLIC PANEL
BUN: 14 mg/dL (ref 6–23)
CHLORIDE: 98 meq/L (ref 96–112)
CO2: 25 meq/L (ref 19–32)
CREATININE: 0.8 mg/dL (ref 0.50–1.10)
Calcium: 9.1 mg/dL (ref 8.4–10.5)
GFR calc non Af Amer: 85 mL/min — ABNORMAL LOW (ref >90)
Glucose, Bld: 127 mg/dL — ABNORMAL HIGH (ref 70–99)
POTASSIUM: 3.6 meq/L — AB (ref 3.7–5.3)
Sodium: 139 mEq/L (ref 137–147)

## 2014-01-17 MED ORDER — SODIUM CHLORIDE 0.9 % IV BOLUS (SEPSIS)
1000.0000 mL | Freq: Once | INTRAVENOUS | Status: AC
  Administered 2014-01-17: 1000 mL via INTRAVENOUS

## 2014-01-17 MED ORDER — ONDANSETRON HCL 4 MG/2ML IJ SOLN
4.0000 mg | Freq: Once | INTRAMUSCULAR | Status: AC
  Administered 2014-01-17: 4 mg via INTRAVENOUS
  Filled 2014-01-17: qty 2

## 2014-01-17 MED ORDER — ONDANSETRON 8 MG PO TBDP: ORAL_TABLET | ORAL | Status: DC

## 2014-01-17 NOTE — ED Notes (Signed)
Pt c/o vomiting and diarrhea x2days, vomit x4 today drinking fluids now with no difficulties, diarrhea x10

## 2014-01-17 NOTE — ED Provider Notes (Signed)
CSN: [id number]     Arrival date & time 01/17/14  0006 History   First MD Initiated Contact with Patient 01/17/14 0022     Chief Complaint  Patient presents with  . Diarrhea     (Consider location/radiation/quality/duration/timing/severity/associated sxs/prior Treatment) Patient is a 48 y.o. female presenting with diarrhea. The history is provided by the patient.  Diarrhea Quality:  Watery Severity:  Severe Onset quality:  Sudden Duration:  1 day Timing:  Intermittent Progression:  Unchanged Relieved by:  Nothing Worsened by:  Nothing tried Ineffective treatments:  None tried Associated symptoms: vomiting   Associated symptoms: no abdominal pain   Vomiting:    Quality:  Stomach contents   Severity:  Moderate   Timing:  Intermittent   Progression:  Unchanged Risk factors: no recent antibiotic use     Past Medical History  Diagnosis Date  . Multiple sclerosis    Past Surgical History  Procedure Laterality Date  . Abdominal hysterectomy    . Cholecystectomy    . Appendectomy    . Tonsillectomy    . Breast surgery     No family history on file. History  Substance Use Topics  . Smoking status: Never Smoker   . Smokeless tobacco: Not on file  . Alcohol Use: No   OB History   Grav Para Term Preterm Abortions TAB SAB Ect Mult Living                 Review of Systems  Gastrointestinal: Positive for vomiting and diarrhea. Negative for abdominal pain.  All other systems reviewed and are negative.      Allergies  Review of patient's allergies indicates no known allergies.  Home Medications   Current Outpatient Rx  Name  Route  Sig  Dispense  Refill  . amantadine (SYMMETREL) 100 MG capsule   Oral   Take 100 mg by mouth 2 (two) times daily.           Marland Kitchen aspirin-acetaminophen-caffeine (EXCEDRIN MIGRAINE) 250-250-65 MG per tablet   Oral   Take 1 tablet by mouth 3 (three) times daily as needed.           . DULoxetine (CYMBALTA) 60 MG capsule   Oral  Take 60 mg by mouth daily.           Marland Kitchen gabapentin (NEURONTIN) 300 MG capsule   Oral   Take 300 mg by mouth 4 (four) times daily.           Marland Kitchen ibuprofen (ADVIL,MOTRIN) 800 MG tablet   Oral   Take 800 mg by mouth once as needed. For pain           . lidocaine (LIDODERM) 5 %   Transdermal   Place 1 patch onto the skin daily. Remove & Discard patch within 12 hours or as directed by MD; for pain          . meloxicam (MOBIC) 15 MG tablet   Oral   Take 15 mg by mouth daily.           . modafinil (PROVIGIL) 200 MG tablet   Oral   Take 100 mg by mouth 2 (two) times daily.           . Multiple Vitamins-Calcium (ONE-A-DAY WOMENS FORMULA PO)   Oral   Take 1 tablet by mouth daily.           . cyclobenzaprine (FLEXERIL) 5 MG tablet   Oral   Take 1-2 tablets (5-10 mg  total) by mouth 3 (three) times daily as needed for muscle spasms. This medication will make you drowsy so use caution while using other sedating medications   30 tablet   0   . FLUoxetine (PROZAC) 40 MG capsule   Oral   Take 40 mg by mouth daily.           . interferon beta-1a (AVONEX PEN) 30 MCG/0.5ML injection   Intramuscular   Inject 30 mcg into the muscle every 7 (seven) days.            BP 122/83  Pulse 98  Temp(Src) 98.2 F (36.8 C) (Oral)  Resp 20  Ht 5\' 7"  (1.702 m)  Wt 200 lb (90.719 kg)  BMI 31.32 kg/m2  SpO2 98% Physical Exam  Constitutional: She is oriented to person, place, and time. She appears well-developed and well-nourished. No distress.  HENT:  Head: Normocephalic and atraumatic.  Mouth/Throat: Oropharynx is clear and moist.  Eyes: Conjunctivae are normal. Pupils are equal, round, and reactive to light.  Neck: Normal range of motion. Neck supple.  Cardiovascular: Normal rate and regular rhythm.   Pulmonary/Chest: Effort normal and breath sounds normal. She has no wheezes. She has no rales.  Abdominal: Soft. Bowel sounds are normal. There is no tenderness. There is no  rebound and no guarding.  Musculoskeletal: Normal range of motion.  Neurological: She is alert and oriented to person, place, and time.  Skin: Skin is warm and dry.  Psychiatric: She has a normal mood and affect.    ED Course  Procedures (including critical care time) Labs Review Labs Reviewed  URINALYSIS, ROUTINE W REFLEX MICROSCOPIC  BASIC METABOLIC PANEL   Imaging Review No results found.   EKG Interpretation None      MDM   Final diagnoses:  None    Medications  sodium chloride 0.9 % bolus 1,000 mL (1,000 mLs Intravenous New Bag/Given 01/17/14 0041)  ondansetron (ZOFRAN) injection 4 mg (4 mg Intravenous Given 01/17/14 0041)   PO challenged successfully symptoms care clearly viral there is no indication for imagine at this time.  Follow up at your family doctor for ongoinng care     Estill Llerena K Rishawn Walck-Rasch, MD 01/17/14 [phone]

## 2014-04-18 DIAGNOSIS — Z0000 Encounter for general adult medical examination without abnormal findings: Secondary | ICD-10-CM | POA: Insufficient documentation

## 2014-04-21 DIAGNOSIS — E785 Hyperlipidemia, unspecified: Secondary | ICD-10-CM | POA: Insufficient documentation

## 2014-09-03 DIAGNOSIS — I10 Essential (primary) hypertension: Secondary | ICD-10-CM | POA: Insufficient documentation

## 2014-11-18 ENCOUNTER — Telehealth: Payer: Self-pay | Admitting: Neurology

## 2014-11-18 NOTE — Telephone Encounter (Signed)
Spoke with Karsen--she c/o recurrent sinus infection, sore throat since Christmas. Sts has been on 2 antibiotics without resolution of infection.  She requested IV SoluMedrol.  Per RAS, I advised she f/u with pcp, possibly ent to determine why she still has sinus infection and best treatment for it.  She verbalized understanding of same/fim

## 2014-11-18 NOTE — Telephone Encounter (Signed)
Patient is calling with severe headaches and fatigue as white blood cell count is extremely low.  Please call at [phone].

## 2014-11-19 ENCOUNTER — Emergency Department (HOSPITAL_BASED_OUTPATIENT_CLINIC_OR_DEPARTMENT_OTHER)
Admission: EM | Admit: 2014-11-19 | Discharge: 2014-11-19 | Disposition: A | Discharge status: Home / Self Care | Payer: BLUE CROSS/BLUE SHIELD | Attending: Emergency Medicine | Admitting: Emergency Medicine

## 2014-11-19 ENCOUNTER — Encounter (HOSPITAL_BASED_OUTPATIENT_CLINIC_OR_DEPARTMENT_OTHER): Payer: Self-pay | Admitting: Emergency Medicine

## 2014-11-19 DIAGNOSIS — Z791 Long term (current) use of non-steroidal anti-inflammatories (NSAID): Secondary | ICD-10-CM | POA: Insufficient documentation

## 2014-11-19 DIAGNOSIS — G35 Multiple sclerosis: Secondary | ICD-10-CM | POA: Insufficient documentation

## 2014-11-19 DIAGNOSIS — G43009 Migraine without aura, not intractable, without status migrainosus: Secondary | ICD-10-CM

## 2014-11-19 DIAGNOSIS — G43909 Migraine, unspecified, not intractable, without status migrainosus: Secondary | ICD-10-CM | POA: Insufficient documentation

## 2014-11-19 DIAGNOSIS — B349 Viral infection, unspecified: Secondary | ICD-10-CM | POA: Diagnosis not present

## 2014-11-19 DIAGNOSIS — Z79899 Other long term (current) drug therapy: Secondary | ICD-10-CM | POA: Insufficient documentation

## 2014-11-19 LAB — URINALYSIS, ROUTINE W REFLEX MICROSCOPIC
BILIRUBIN URINE: NEGATIVE
Glucose, UA: NEGATIVE mg/dL
Hgb urine dipstick: NEGATIVE
Ketones, ur: NEGATIVE mg/dL
Leukocytes, UA: NEGATIVE
NITRITE: NEGATIVE
PH: 7 (ref 5.0–8.0)
Protein, ur: NEGATIVE mg/dL
SPECIFIC GRAVITY, URINE: 1.004 — AB (ref 1.005–1.030)
UROBILINOGEN UA: 0.2 mg/dL (ref 0.0–1.0)

## 2014-11-19 LAB — CBC WITH DIFFERENTIAL/PLATELET
BASOS PCT: 0 % (ref 0–1)
Basophils Absolute: 0 10*3/uL (ref 0.0–0.1)
Eosinophils Absolute: 0.1 10*3/uL (ref 0.0–0.7)
Eosinophils Relative: 1 % (ref 0–5)
HEMATOCRIT: 38.4 % (ref 36.0–46.0)
Hemoglobin: 12.4 g/dL (ref 12.0–15.0)
LYMPHS PCT: 8 % — AB (ref 12–46)
Lymphs Abs: 0.7 10*3/uL (ref 0.7–4.0)
MCH: 31.3 pg (ref 26.0–34.0)
MCHC: 32.3 g/dL (ref 30.0–36.0)
MCV: 97 fL (ref 78.0–100.0)
MONO ABS: 1.4 10*3/uL — AB (ref 0.1–1.0)
MONOS PCT: 15 % — AB (ref 3–12)
NEUTROS ABS: 6.8 10*3/uL (ref 1.7–7.7)
NEUTROS PCT: 76 % (ref 43–77)
Platelets: 255 10*3/uL (ref 150–400)
RBC: 3.96 MIL/uL (ref 3.87–5.11)
RDW: 13.9 % (ref 11.5–15.5)
WBC: 9 10*3/uL (ref 4.0–10.5)

## 2014-11-19 LAB — BASIC METABOLIC PANEL
ANION GAP: 10 (ref 5–15)
BUN: 9 mg/dL (ref 6–23)
CALCIUM: 8.3 mg/dL — AB (ref 8.4–10.5)
CO2: 25 mmol/L (ref 19–32)
Chloride: 98 mEq/L (ref 96–112)
Creatinine, Ser: 0.69 mg/dL (ref 0.50–1.10)
Glucose, Bld: 179 mg/dL — ABNORMAL HIGH (ref 70–99)
POTASSIUM: 3.3 mmol/L — AB (ref 3.5–5.1)
SODIUM: 133 mmol/L — AB (ref 135–145)

## 2014-11-19 LAB — RAPID STREP SCREEN (MED CTR MEBANE ONLY): STREPTOCOCCUS, GROUP A SCREEN (DIRECT): NEGATIVE

## 2014-11-19 MED ORDER — TOBRAMYCIN 0.3 % OP OINT: 1.0000 "application " | TOPICAL_OINTMENT | Freq: Three times a day (TID) | OPHTHALMIC | Status: DC

## 2014-11-19 MED ORDER — DEXAMETHASONE SODIUM PHOSPHATE 10 MG/ML IJ SOLN
10.0000 mg | Freq: Once | INTRAMUSCULAR | Status: AC
  Administered 2014-11-19: 10 mg via INTRAVENOUS
  Filled 2014-11-19: qty 1

## 2014-11-19 MED ORDER — SODIUM CHLORIDE 0.9 % IV BOLUS (SEPSIS)
1000.0000 mL | Freq: Once | INTRAVENOUS | Status: AC
  Administered 2014-11-19: 1000 mL via INTRAVENOUS

## 2014-11-19 MED ORDER — DIPHENHYDRAMINE HCL 50 MG/ML IJ SOLN
25.0000 mg | Freq: Once | INTRAMUSCULAR | Status: AC
  Administered 2014-11-19: 25 mg via INTRAVENOUS
  Filled 2014-11-19: qty 1

## 2014-11-19 MED ORDER — KETOROLAC TROMETHAMINE 30 MG/ML IJ SOLN
30.0000 mg | Freq: Once | INTRAMUSCULAR | Status: AC
  Administered 2014-11-19: 30 mg via INTRAVENOUS
  Filled 2014-11-19: qty 1

## 2014-11-19 MED ORDER — METOCLOPRAMIDE HCL 5 MG/ML IJ SOLN
10.0000 mg | Freq: Once | INTRAMUSCULAR | Status: AC
  Administered 2014-11-19: 10 mg via INTRAVENOUS
  Filled 2014-11-19: qty 2

## 2014-11-19 MED ORDER — ONDANSETRON HCL 4 MG/2ML IJ SOLN
4.0000 mg | Freq: Once | INTRAMUSCULAR | Status: AC
  Administered 2014-11-19: 4 mg via INTRAVENOUS
  Filled 2014-11-19: qty 2

## 2014-11-19 MED ORDER — ONDANSETRON 4 MG PO TBDP: ORAL_TABLET | ORAL | Status: DC

## 2014-11-19 NOTE — Discharge Instructions (Signed)
Migraine Headache °A migraine headache is an intense, throbbing pain on one or both sides of your head. A migraine can last for 30 minutes to several hours. °CAUSES  °The exact cause of a migraine headache is not always known. However, a migraine may be caused when nerves in the brain become irritated and release chemicals that cause inflammation. This causes pain. °Certain things may also trigger migraines, such as: °· Alcohol. °· Smoking. °· Stress. °· Menstruation. °· Aged cheeses. °· Foods or drinks that contain nitrates, glutamate, aspartame, or tyramine. °· Lack of sleep. °· Chocolate. °· Caffeine. °· Hunger. °· Physical exertion. °· Fatigue. °· Medicines used to treat chest pain (nitroglycerine), birth control pills, estrogen, and some blood pressure medicines. °SIGNS AND SYMPTOMS °· Pain on one or both sides of your head. °· Pulsating or throbbing pain. °· Severe pain that prevents daily activities. °· Pain that is aggravated by any physical activity. °· Nausea, vomiting, or both. °· Dizziness. °· Pain with exposure to bright lights, loud noises, or activity. °· General sensitivity to bright lights, loud noises, or smells. °Before you get a migraine, you may get warning signs that a migraine is coming (aura). An aura may include: °· Seeing flashing lights. °· Seeing bright spots, halos, or zigzag lines. °· Having tunnel vision or blurred vision. °· Having feelings of numbness or tingling. °· Having trouble talking. °· Having muscle weakness. °DIAGNOSIS  °A migraine headache is often diagnosed based on: °· Symptoms. °· Physical exam. °· A CT scan or MRI of your head. These imaging tests cannot diagnose migraines, but they can help rule out other causes of headaches. °TREATMENT °Medicines may be given for pain and nausea. Medicines can also be given to help prevent recurrent migraines.  °HOME CARE INSTRUCTIONS °· Only take over-the-counter or prescription medicines for pain or discomfort as directed by your  health care provider. The use of long-term narcotics is not recommended. °· Lie down in a dark, quiet room when you have a migraine. °· Keep a journal to find out what may trigger your migraine headaches. For example, write down: °· What you eat and drink. °· How much sleep you get. °· Any change to your diet or medicines. °· Limit alcohol consumption. °· Quit smoking if you smoke. °· Get 7-9 hours of sleep, or as recommended by your health care provider. °· Limit stress. °· Keep lights dim if bright lights bother you and make your migraines worse. °SEEK IMMEDIATE MEDICAL CARE IF:  °· Your migraine becomes severe. °· You have a fever. °· You have a stiff neck. °· You have vision loss. °· You have muscular weakness or loss of muscle control. °· You start losing your balance or have trouble walking. °· You feel faint or pass out. °· You have severe symptoms that are different from your first symptoms. °MAKE SURE YOU:  °· Understand these instructions. °· Will watch your condition. °· Will get help right away if you are not doing well or get worse. °Document Released: 10/17/2005 Document Revised: 03/03/2014 Document Reviewed: 06/24/2013 °[employer]® Patient Information ©2015 [employer]. This information is not intended to replace advice given to you by your health care provider. Make sure you discuss any questions you have with your health care provider. ° °Viral Infections °A viral infection can be caused by different types of viruses. Most viral infections are not serious and resolve on their own. However, some infections may cause severe symptoms and may lead to further complications. °SYMPTOMS °Viruses can   frequently cause: °· Minor sore throat. °· Aches and pains. °· Headaches. °· Runny nose. °· Different types of rashes. °· Watery eyes. °· Tiredness. °· Cough. °· Loss of appetite. °· Gastrointestinal infections, resulting in nausea, vomiting, and diarrhea. °These symptoms do not respond to antibiotics because  the infection is not caused by bacteria. However, you might catch a bacterial infection following the viral infection. This is sometimes called a "superinfection." Symptoms of such a bacterial infection may include: °· Worsening sore throat with pus and difficulty swallowing. °· Swollen neck glands. °· Chills and a high or persistent fever. °· Severe headache. °· Tenderness over the sinuses. °· Persistent overall ill feeling (malaise), muscle aches, and tiredness (fatigue). °· Persistent cough. °· Yellow, green, or brown mucus production with coughing. °HOME CARE INSTRUCTIONS  °· Only take over-the-counter or prescription medicines for pain, discomfort, diarrhea, or fever as directed by your caregiver. °· Drink enough water and fluids to keep your urine clear or pale yellow. Sports drinks can provide valuable electrolytes, sugars, and hydration. °· Get plenty of rest and maintain proper nutrition. Soups and broths with crackers or rice are fine. °SEEK IMMEDIATE MEDICAL CARE IF:  °· You have severe headaches, shortness of breath, chest pain, neck pain, or an unusual rash. °· You have uncontrolled vomiting, diarrhea, or you are unable to keep down fluids. °· You or your child has an oral temperature above 102° F (38.9° C), not controlled by medicine. °· Your baby is older than 3 months with a rectal temperature of 102° F (38.9° C) or higher. °· Your baby is 3 months old or younger with a rectal temperature of 100.4° F (38° C) or higher. °MAKE SURE YOU:  °· Understand these instructions. °· Will watch your condition. °· Will get help right away if you are not doing well or get worse. °Document Released: 07/27/2005 Document Revised: 01/09/2012 Document Reviewed: 02/21/2011 °[employer]® Patient Information ©2015 [employer]. This information is not intended to replace advice given to you by your health care provider. Make sure you discuss any questions you have with your health care provider. ° °

## 2014-11-19 NOTE — ED Notes (Signed)
Pt states that she has had headache, vomiting and diarrhea since sunday

## 2014-11-19 NOTE — ED Notes (Addendum)
Ha onset saturday, n/v vomiting, sorethroat and diarrhea since sunday

## 2014-11-19 NOTE — ED Notes (Signed)
MD at bedside. 

## 2014-11-19 NOTE — ED Provider Notes (Signed)
CSN: [id number]     Arrival date & time 11/19/14  4098 History   First MD Initiated Contact with Patient 11/19/14 [phone]     Chief Complaint  Patient presents with  . Migraine     (Consider location/radiation/quality/duration/timing/severity/associated sxs/prior Treatment) HPI Comments: Patient with a history of MS presents with a migraine headache. She states that her migraines started on Saturday and has gradually gotten worse over the last few days. She states the headache as a throbbing pain to the front of her head. She states it feels similar to her past migraines type headaches. She's had associated nausea and vomiting. She also says her for last 2-3 days she's had a sore throat and dry cough. She denies any nasal congestion. She denies any chest congestion. She's had some vomiting and watery diarrhea. She denies abdominal pain. She denies any fevers or chills. She denies any neck pain. She was recently treated for sinus infection but feels that her sinus infection is better.  Patient is a 49 y.o. female presenting with migraines.  Migraine Associated symptoms include headaches. Pertinent negatives include no chest pain, no abdominal pain and no shortness of breath.    Past Medical History  Diagnosis Date  . Multiple sclerosis    Past Surgical History  Procedure Laterality Date  . Abdominal hysterectomy    . Cholecystectomy    . Appendectomy    . Tonsillectomy    . Breast surgery     History reviewed. No pertinent family history. History  Substance Use Topics  . Smoking status: Never Smoker   . Smokeless tobacco: Not on file  . Alcohol Use: No   OB History    No data available     Review of Systems  Constitutional: Positive for fatigue. Negative for fever, chills and diaphoresis.  HENT: Positive for sore throat. Negative for congestion, rhinorrhea and sneezing.   Eyes: Negative.   Respiratory: Positive for cough. Negative for chest tightness and shortness of breath.    Cardiovascular: Negative for chest pain and leg swelling.  Gastrointestinal: Positive for nausea, vomiting and diarrhea. Negative for abdominal pain and blood in stool.  Genitourinary: Negative for frequency, hematuria, flank pain and difficulty urinating.  Musculoskeletal: Negative for back pain and arthralgias.  Skin: Negative for rash.  Neurological: Positive for headaches. Negative for dizziness, speech difficulty, weakness and numbness.      Allergies  Review of patient's allergies indicates no known allergies.  Home Medications   Prior to Admission medications   Medication Sig Start Date End Date Taking? Authorizing Provider  amantadine (SYMMETREL) 100 MG capsule Take 100 mg by mouth 2 (two) times daily.      Historical Provider, MD  aspirin-acetaminophen-caffeine (EXCEDRIN MIGRAINE) [phone] MG per tablet Take 1 tablet by mouth 3 (three) times daily as needed.      Historical Provider, MD  cyclobenzaprine (FLEXERIL) 5 MG tablet Take 1-2 tablets (5-10 mg total) by mouth 3 (three) times daily as needed for muscle spasms. This medication will make you drowsy so use caution while using other sedating medications 08/08/12   Ozella Rocks, MD  DULoxetine (CYMBALTA) 60 MG capsule Take 60 mg by mouth daily.      Historical Provider, MD  FLUoxetine (PROZAC) 40 MG capsule Take 40 mg by mouth daily.      Historical Provider, MD  gabapentin (NEURONTIN) 300 MG capsule Take 300 mg by mouth 4 (four) times daily.      Historical Provider, MD  ibuprofen (ADVIL,MOTRIN) 800  MG tablet Take 800 mg by mouth once as needed. For pain      Historical Provider, MD  interferon beta-1a (AVONEX PEN) 30 MCG/0.5ML injection Inject 30 mcg into the muscle every 7 (seven) days.      Historical Provider, MD  lidocaine (LIDODERM) 5 % Place 1 patch onto the skin daily. Remove & Discard patch within 12 hours or as directed by MD; for pain     Historical Provider, MD  meloxicam (MOBIC) 15 MG tablet Take 15 mg by  mouth daily.      Historical Provider, MD  modafinil (PROVIGIL) 200 MG tablet Take 100 mg by mouth 2 (two) times daily.      Historical Provider, MD  Multiple Vitamins-Calcium (ONE-A-DAY WOMENS FORMULA PO) Take 1 tablet by mouth daily.      Historical Provider, MD  ondansetron (ZOFRAN ODT) 4 MG disintegrating tablet  ODT q4 hours prn nausea/vomit 11/19/14   Rolan Bucco, MD   BP 128/75 mmHg  Pulse 88  Temp(Src) 99.2 F (37.3 C) (Oral)  Resp 18  Ht  (1.702 m)  Wt 200 lb (90.719 kg)  BMI 31.32 kg/m2  SpO2 95% Physical Exam  Constitutional: She is oriented to person, place, and time. She appears well-developed and well-nourished.  HENT:  Head: Normocephalic and atraumatic.  Right Ear: External ear normal.  Left Ear: External ear normal.  Mouth/Throat: Oropharynx is clear and moist. No oropharyngeal exudate.  Eyes: Pupils are equal, round, and reactive to light.  Neck: Normal range of motion. Neck supple.  No meningismus  Cardiovascular: Normal rate, regular rhythm and normal heart sounds.   Pulmonary/Chest: Effort normal and breath sounds normal. No respiratory distress. She has no wheezes. She has no rales. She exhibits no tenderness.  Abdominal: Soft. Bowel sounds are normal. There is no tenderness. There is no rebound and no guarding.  Musculoskeletal: Normal range of motion. She exhibits no edema.  Lymphadenopathy:    She has no cervical adenopathy.  Neurological: She is alert and oriented to person, place, and time. She has normal strength. No cranial nerve deficit or sensory deficit. GCS eye subscore is 4. GCS verbal subscore is 5. GCS motor subscore is 6.  Skin: Skin is warm and dry. No rash noted.  Psychiatric: She has a normal mood and affect.    ED Course  Procedures (including critical care time) Labs Review Labs Reviewed  URINALYSIS, ROUTINE W REFLEX MICROSCOPIC - Abnormal; Notable for the following:    Specific Gravity, Urine 1.004 (*)    All other  components within normal limits  CBC WITH DIFFERENTIAL - Abnormal; Notable for the following:    Lymphocytes Relative 8 (*)    Monocytes Relative 15 (*)    Monocytes Absolute 1.4 (*)    All other components within normal limits  BASIC METABOLIC PANEL - Abnormal; Notable for the following:    Sodium 133 (*)    Potassium 3.3 (*)    Glucose, Bld 179 (*)    Calcium 8.3 (*)    All other components within normal limits  RAPID STREP SCREEN  CULTURE, GROUP A STREP    Imaging Review No results found.   EKG Interpretation None      MDM   Final diagnoses:  Migraine without aura and without status migrainosus, not intractable  Viral syndrome    Patient presents with a migraine headache as well as some diarrhea and some viral-like symptoms. Her headache is completely relieved after migraine cocktail. Her headache is  consistent with her past migraines type headaches. There is no unusual symptoms that would be more suggestive of subarachnoid hemorrhage or meningitis. She has other symptoms that are more suggestive of a viral syndrome. She has no suggestions of pneumonia. Her labs are unremarkable other than her sodium is slightly low at 133. She was given IV fluid hydration. She does have some watery diarrhea and has recently been on antibiotics. I did advise her if she has an episode of diarrhea in the ED we can send her stool for testing for C. difficile. However she did not have any further episodes of diarrhea while she was in the ED. I encouraged her to follow-up with her primary care physician if her diarrhea continues and she may need to be tested for C. difficile. Return precautions were given.    Rolan Bucco, MD 11/19/14 1026

## 2014-11-21 LAB — CULTURE, GROUP A STREP

## 2014-12-09 ENCOUNTER — Encounter: Payer: Self-pay | Admitting: Neurology

## 2014-12-09 ENCOUNTER — Ambulatory Visit (INDEPENDENT_AMBULATORY_CARE_PROVIDER_SITE_OTHER): Payer: BLUE CROSS/BLUE SHIELD | Admitting: Neurology

## 2014-12-09 VITALS — BP 138/88 | HR 80 | Resp 14 | Ht 67.0 in | Wt 212.0 lb

## 2014-12-09 DIAGNOSIS — F329 Major depressive disorder, single episode, unspecified: Secondary | ICD-10-CM | POA: Insufficient documentation

## 2014-12-09 DIAGNOSIS — F32A Depression, unspecified: Secondary | ICD-10-CM | POA: Insufficient documentation

## 2014-12-09 DIAGNOSIS — G35 Multiple sclerosis: Secondary | ICD-10-CM | POA: Diagnosis not present

## 2014-12-09 DIAGNOSIS — D8989 Other specified disorders involving the immune mechanism, not elsewhere classified: Secondary | ICD-10-CM | POA: Insufficient documentation

## 2014-12-09 DIAGNOSIS — R5382 Chronic fatigue, unspecified: Secondary | ICD-10-CM

## 2014-12-09 DIAGNOSIS — G4700 Insomnia, unspecified: Secondary | ICD-10-CM | POA: Diagnosis not present

## 2014-12-09 DIAGNOSIS — H539 Unspecified visual disturbance: Secondary | ICD-10-CM

## 2014-12-09 DIAGNOSIS — R5383 Other fatigue: Secondary | ICD-10-CM | POA: Insufficient documentation

## 2014-12-09 MED ORDER — HYDROCODONE-ACETAMINOPHEN 5-325 MG PO TABS: 1.0000 | ORAL_TABLET | Freq: Four times a day (QID) | ORAL | Status: DC | PRN

## 2014-12-09 MED ORDER — PHENTERMINE HCL 37.5 MG PO CAPS: 37.5000 mg | ORAL_CAPSULE | ORAL | Status: DC

## 2014-12-09 NOTE — Progress Notes (Signed)
[facility]  PATIENT: Veronica Frazier DOB: 23-Jun-1966  REFERRING CLINICIAN: Harlene Ramus HISTORY FROM: patient REASON FOR VISIT: MS   HISTORICAL  CHIEF COMPLAINT:  Chief Complaint  Patient presents with  . Multiple Sclerosis    Sts. balance and memory are worse.  More falls.  Recently admitted, treated at [facility] for n/v/d, dx with colitis./fim    HISTORY OF PRESENT ILLNESS:  Veronica Frazier is a 49 year old woman who was diagnosed with MS in 2000 after presenting with pain and tingling in the left arm. At that time, she had an MRI of the brain performed that was concerning for multiple sclerosis. Additionally, she had a lumbar puncture performed with CSF that was consistent with MS. Initially, she was placed on Avonex injections weekly. Initially, she did well with the Avonex but then had an MRI that showed some progression. About 3 years ago she switched to Tecfidera. She tolerates Tecfidera fairly well, having just a little bit of flushing  Currently, her main difficulties with MS are decreased balance, visual disturbances, memory issues and fatigue.  Her gait difficulties are more related to balance than to strength. However, she fatigues easily and if she is going to go long distances her family will usually use a wheelchair for her. She had a couple of falls recently but notes that it was on ice. She denies significant numbness or dysesthesia.  She notes visual blurring bilaterally and also has had some diplopia at times. The symptoms are fairly constant and not affected by fatigue or heat.  She denies much difficulties with her bladder. However, she has recently been diagnosed with colitis and has had a lot of bowel difficulties lately. Initially, she had a lot of diarrhea. Now she is having more constipation.  She has noted some cognitive difficulties, especially with short-term memory. She also has had difficulties with verbal fluency and  executive function. She sometimes forgets to finish tasks that she has started.  Mood has been doing worse lately. She has had some crying spells. She remains on duloxetine and fluoxetine. She tolerates these well but they do not seem to be helping as much as they did in the past. She is currently not seeing a psychiatrist or psychologist but have seen them in the past.  She also reports a lot of difficulties with fatigue. She wakes up fatigued and feels even more so as the day goes on. She also has some daytime sleepiness. She falls asleep fairly easily but will wake up after couple hours and then have difficulty falling back asleep. Therefore, her sleep is fragmented into several segments including one nap that add up to about 6 hours every day. She snores. However, her husband has not noted any gasping at night or pauses in her breathing. She is currently on modafinil and amantadine but they have not helped much as they did in the past. He was prescribed lorazepam 1 mg nightly which has helped her to fall asleep and helps him with his sleep maintenance, as well.  She also reports a lot of back pain, mostly midline without radiation to the limbs. Gabapentin and hydrocodone has helped this pain some. She takes the hydrocodone up to 3 times a day but will have some days where she does not take any.  REVIEW OF SYSTEMS:  Constitutional: No fevers, chills, sweats, or change in appetite   She has fatigue Eyes: Blurry vision and rare diplopia.  No eye pain Ear, nose and throat: No  hearing loss, ear pain, nasal congestion, sore throat Cardiovascular: No chest pain, palpitations Respiratory:  No shortness of breath at rest or with exertion.   No wheezes GastrointestinaI: No nausea but recent diarrhea and constipation.    Colitis being treated Genitourinary:  No dysuria, urinary retention or frequency.  No nocturia. Musculoskeletal:  No neck pain.  Mild mid back pain Integumentary: No rash, pruritus, skin  lesions Neurological: as above Psychiatric:Depression.  No anxiety Endocrine: No palpitations, diaphoresis.    Increased hunger and  increased thirst Hematologic/Lymphatic:  No anemia, purpura, petechiae. Allergic/Immunologic: No itchy/runny eyes, nasal congestion, recent allergic reactions, rashes  ALLERGIES: No Known Allergies  HOME MEDICATIONS: Outpatient Prescriptions Prior to Visit  Medication Sig Dispense Refill  . amantadine (SYMMETREL) 100 MG capsule Take 100 mg by mouth 2 (two) times daily.      Marland Kitchen aspirin-acetaminophen-caffeine (EXCEDRIN MIGRAINE) 250-250-65 MG per tablet Take 1 tablet by mouth 3 (three) times daily as needed.      . cyclobenzaprine (FLEXERIL) 5 MG tablet Take 1-2 tablets (5-10 mg total) by mouth 3 (three) times daily as needed for muscle spasms. This medication will make you drowsy so use caution while using other sedating medications 30 tablet 0  . DULoxetine (CYMBALTA) 60 MG capsule Take 60 mg by mouth daily.      Marland Kitchen FLUoxetine (PROZAC) 40 MG capsule Take 40 mg by mouth daily.      Marland Kitchen gabapentin (NEURONTIN) 300 MG capsule Take 300 mg by mouth 4 (four) times daily.      Marland Kitchen ibuprofen (ADVIL,MOTRIN) 800 MG tablet Take 800 mg by mouth once as needed. For pain      . lidocaine (LIDODERM) 5 % Place 1 patch onto the skin daily. Remove & Discard patch within 12 hours or as directed by MD; for pain     . meloxicam (MOBIC) 15 MG tablet Take 15 mg by mouth daily.      . modafinil (PROVIGIL) 200 MG tablet Take 100 mg by mouth 2 (two) times daily.      . Multiple Vitamins-Calcium (ONE-A-DAY WOMENS FORMULA PO) Take 1 tablet by mouth daily.      . interferon beta-1a (AVONEX PEN) 30 MCG/0.5ML injection Inject 30 mcg into the muscle every 7 (seven) days.      . ondansetron (ZOFRAN ODT) 4 MG disintegrating tablet 4mg  ODT q4 hours prn nausea/vomit (Patient not taking: Reported on 12/09/2014) 4 tablet 0   No facility-administered medications prior to visit.    PAST MEDICAL  HISTORY: Past Medical History  Diagnosis Date  . Multiple sclerosis   . Hypertension   . Vision abnormalities   . Neuropathy   . Stroke     PAST SURGICAL HISTORY: Past Surgical History  Procedure Laterality Date  . Abdominal hysterectomy    . Cholecystectomy    . Appendectomy    . Tonsillectomy    . Breast surgery      FAMILY HISTORY: Family History  Problem Relation Age of Onset  . Hypertension Mother   . Hyperlipidemia Mother   . Colitis Mother   . Hypertension Father     SOCIAL HISTORY:  History   Social History  . Marital Status: Married    Spouse Name: N/A    Number of Children: N/A  . Years of Education: N/A   Occupational History  . Not on file.   Social History Main Topics  . Smoking status: Former Games developer  . Smokeless tobacco: Not on file  . Alcohol Use: No  .  Drug Use: No  . Sexual Activity: Yes    Birth Control/ Protection: Surgical   Other Topics Concern  . Not on file   Social History Narrative     PHYSICAL EXAM  Filed Vitals:   12/09/14 0845  BP: 138/88  Pulse: 80  Resp: 14  Height:  (1.702 m)  Weight: 212 lb (96.163 kg)    Body mass index is 33.2 kg/(m^2).   General: The patient is well-developed and well-nourished and in no acute distress  Eyes:  Funduscopic exam shows normal optic discs and retinal vessels.  Neck: The neck is supple, no carotid bruits are noted.  The neck is nontender.  Respiratory: The respiratory examination is clear.  Cardiovascular: The cardiovascular examination reveals a regular rate and rhythm, no murmurs, gallops or rubs are noted.  Skin: Extremities are without significant edema.  Neurologic Exam  Mental status: The patient is alert and oriented x 3 at the time of the examination. The patient has apparent normal recent and remote memory, with an apparently normal attention span and concentration ability.   Speech is normal.  Cranial nerves: Extraocular movements are full. Pupils are  equal, round, and reactive to light and accomodation.  Visual fields are full.  Facial symmetry is present. There is good facial sensation to soft touch bilaterally.Facial strength is normal.  Trapezius and sternocleidomastoid strength is normal. No dysarthria is noted.  The tongue is midline, and the patient has symmetric elevation of the soft palate. No obvious hearing deficits are noted.  Motor:  Muscle bulk and tone are normal. Strength is  5 / 5 in all 4 extremities.   Sensory: Sensory testing is intact to pinprick, soft touch, vibration sensation, and position sense on all 4 extremities.  Coordination: Cerebellar testing reveals good finger-nose-finger and heel-to-shin bilaterally.  Gait and station: Station and gait are fairly normal. Tandem gait is wide. Romberg is negative.   Reflexes: Deep tendon reflexes are symmetric and normal bilaterally. Plantar responses are normal.    DIAGNOSTIC DATA (LABS, IMAGING, TESTING) - I reviewed patient records, labs, notes, testing and imaging myself where available.  Lab Results  Component Value Date   WBC 9.0 11/19/2014   HGB 12.4 11/19/2014   HCT 38.4 11/19/2014   MCV 97.0 11/19/2014   PLT 255 11/19/2014      Component Value Date/Time   NA 133* 11/19/2014 0705   K 3.3* 11/19/2014 0705   CL 98 11/19/2014 0705   CO2 25 11/19/2014 0705   GLUCOSE 179* 11/19/2014 0705   BUN 9 11/19/2014 0705   CREATININE 0.69 11/19/2014 0705   CALCIUM 8.3* 11/19/2014 0705   PROT 6.9 06/13/2011 0306   ALBUMIN 3.6 06/13/2011 0306   AST 56* 06/13/2011 0306   ALT 58* 06/13/2011 0306   ALKPHOS 88 06/13/2011 0306   BILITOT 0.5 06/13/2011 0306   GFRNONAA >90 11/19/2014 0705   GFRAA >90 11/19/2014 0705       ASSESSMENT AND PLAN  Multiple sclerosis, relapsing-remitting - Plan: CBC with Differential  Chronic fatigue  Depression  Visual disturbance  Insomnia   In summary, Veronica Frazier is a 49 year old woman with multiple sclerosis who has  been fairly stable on Tecfidera therapy. She tolerates the medication well and we will check a CBC with differential to make sure that she does not have a lymphopenia as lymphopenia has been associated with a higher risk of PML. Fatigue has been worse lately despite treatment with Provigil and amantadine. She is not sure if  she is getting any benefit from amantadine at this point. I will stop the amantadine and have her start phentermine 37.5 mg daily. This will hopefully help her fatigue as well as perhaps help her lose some weight. Other medications will be continued and refills were written.  He will return to see me in 4 months or sooner if she has new or worsening symptoms.  45 minute face-to-face office visit with greater than 50% of the time counseling or correlating care about her multiple sclerosis and symptoms.    Gyselle Matthew A. Epimenio Foot, MD, PhD 12/09/2014, 9:09 AM Certified in Neurology, Clinical Neurophysiology, Sleep Medicine, Pain Medicine and Neuroimaging  [facility] [address], [address] [place], [place] [address] [phone]

## 2014-12-10 LAB — CBC WITH DIFFERENTIAL/PLATELET
BASOS: 1 %
Basophils Absolute: 0.1 10*3/uL (ref 0.0–0.2)
Eos: 2 %
Eosinophils Absolute: 0.1 10*3/uL (ref 0.0–0.4)
HCT: 39.3 % (ref 34.0–46.6)
HEMOGLOBIN: 13.4 g/dL (ref 11.1–15.9)
IMMATURE GRANS (ABS): 0.1 10*3/uL (ref 0.0–0.1)
IMMATURE GRANULOCYTES: 1 %
LYMPHS: 16 %
Lymphocytes Absolute: 0.8 10*3/uL (ref 0.7–3.1)
MCH: 31.3 pg (ref 26.6–33.0)
MCHC: 34.1 g/dL (ref 31.5–35.7)
MCV: 92 fL (ref 79–97)
MONOS ABS: 0.5 10*3/uL (ref 0.1–0.9)
Monocytes: 10 %
NEUTROS PCT: 70 %
Neutrophils Absolute: 3.7 10*3/uL (ref 1.4–7.0)
Platelets: 353 10*3/uL (ref 150–379)
RBC: 4.28 x10E6/uL (ref 3.77–5.28)
RDW: 14.7 % (ref 12.3–15.4)
WBC: 5.3 10*3/uL (ref 3.4–10.8)

## 2014-12-11 ENCOUNTER — Telehealth: Payer: Self-pay | Admitting: *Deleted

## 2014-12-11 NOTE — Telephone Encounter (Signed)
Spoke with Veronica Frazier and per RAS, advised that labwork looks good--stay on Tecfidera as rx'd.  She verbalized understanding of same/fim

## 2014-12-24 ENCOUNTER — Telehealth: Payer: Self-pay | Admitting: *Deleted

## 2014-12-24 NOTE — Telephone Encounter (Signed)
Pt called back and I scheduled her for IV for 2/25 @ 1pm.

## 2014-12-24 NOTE — Telephone Encounter (Signed)
Patient is calling wanting to get set up for a steroid injection. Please call patient and advise.

## 2014-12-24 NOTE — Telephone Encounter (Signed)
noted iv is sched. for 1pm tomorrow/fim

## 2014-12-24 NOTE — Telephone Encounter (Signed)
I spoke with Veronica Frazier earlier this afternoon.  She c/o increased fatigue, some blurry vision over the last couple of days.  Per RAS, ok for Solu-Medrol 1gm IV.  I left Yumalai a message at home # for her to call to set up IV.  I can do this for her tomorrow (2-25) at 8am, 8:30am, 11am, or 1pm.  She needs a 30 min. appt. on the [facility] nurse schedule for whichever time is good for her./fim

## 2014-12-25 ENCOUNTER — Ambulatory Visit: Payer: PRIVATE HEALTH INSURANCE

## 2014-12-25 NOTE — Telephone Encounter (Signed)
Patient is calling to cancel the appointment today for an IV Infusion. The patient states she would like to reschedule for next Wednesday. Please call patient. Thank you.

## 2014-12-25 NOTE — Telephone Encounter (Signed)
Spoke with Veronica Frazier--she has a sick child home today so needs to cancel her appt for solumedrol today. Sts. she feels some better today.  She has an appt. with her eye doctor next Wednesday and will call me back after that if she feels she needs SoluMedrol./fim

## 2015-01-08 ENCOUNTER — Other Ambulatory Visit: Payer: Self-pay | Admitting: Neurology

## 2015-01-08 MED ORDER — LORAZEPAM 1 MG PO TABS: 1.0000 mg | ORAL_TABLET | Freq: Every day | ORAL | Status: DC

## 2015-01-08 NOTE — Telephone Encounter (Signed)
Pt is calling requesting a refill on LORazepam (ATIVAN) 1 MG tablet. [facility] uses [facility] [place].  Please call and advise.

## 2015-01-09 NOTE — Telephone Encounter (Signed)
Rx signed and faxed.

## 2015-01-21 ENCOUNTER — Other Ambulatory Visit: Payer: Self-pay | Admitting: Neurology

## 2015-01-21 MED ORDER — HYDROCODONE-ACETAMINOPHEN 5-325 MG PO TABS: 1.0000 | ORAL_TABLET | Freq: Four times a day (QID) | ORAL | Status: DC | PRN

## 2015-01-21 MED ORDER — MODAFINIL 200 MG PO TABS: 100.0000 mg | ORAL_TABLET | Freq: Two times a day (BID) | ORAL | Status: DC

## 2015-01-21 NOTE — Telephone Encounter (Signed)
Pt is calling requesting a written Rx for modafinil (PROVIGIL) 200 MG tablet and HYDROcodone-acetaminophen (NORCO/VICODIN) 5-325 MG per tablet. Please call when ready for pick up.

## 2015-01-26 ENCOUNTER — Other Ambulatory Visit: Payer: Self-pay | Admitting: *Deleted

## 2015-01-26 ENCOUNTER — Telehealth: Payer: Self-pay | Admitting: Neurology

## 2015-01-26 MED ORDER — MODAFINIL 200 MG PO TABS: 100.0000 mg | ORAL_TABLET | Freq: Two times a day (BID) | ORAL | Status: DC

## 2015-01-26 MED ORDER — MODAFINIL 200 MG PO TABS: ORAL_TABLET | ORAL | Status: DC

## 2015-01-26 NOTE — Telephone Encounter (Signed)
Patient is calling back in regard Provigil 200 mg with info you need:  take 1 1/2 tablets per day 90 day Rx, quanity 135.  Thanks!

## 2015-01-26 NOTE — Telephone Encounter (Signed)
Rx. was printed on 01-21-15 for #30 with 5 r/f.  Ins. requests #90 day supply.  Per RAS, ok, 3-23 rx. was destroyed, reprinted for #90 with 0 refills./fim

## 2015-03-03 ENCOUNTER — Telehealth: Payer: Self-pay | Admitting: Neurology

## 2015-03-03 MED ORDER — HYDROCODONE-ACETAMINOPHEN 5-325 MG PO TABS: 1.0000 | ORAL_TABLET | Freq: Four times a day (QID) | ORAL | Status: DC | PRN

## 2015-03-03 NOTE — Telephone Encounter (Signed)
Hydrocodone last r/f 01/21/15.   I have spoken with Anahi and advised rx. will be ready this afternoon.  Rx. printed, signed, up front [facility]/fim

## 2015-03-03 NOTE — Telephone Encounter (Signed)
Patient called stated that she needed a refill on her Rx. HYDROcodone-acetaminophen (NORCO/VICODIN) 5-325 MG per tablet. Please call and advise.

## 2015-04-01 ENCOUNTER — Telehealth: Payer: Self-pay | Admitting: Neurology

## 2015-04-01 MED ORDER — DIMETHYL FUMARATE 240 MG PO CPDR: 240.0000 mg | DELAYED_RELEASE_CAPSULE | Freq: Two times a day (BID) | ORAL | Status: DC

## 2015-04-01 NOTE — Telephone Encounter (Signed)
[facility] called regarding Tecfiedra. Needs a script for this medication. Patient is completely out, old script ran out 03/11/15. Please call and advise @ [phone] and fax # [phone].

## 2015-04-01 NOTE — Telephone Encounter (Signed)
I have called Tecfidera rx. (240mg , #180 with 3 additional r/f) in to [facility] at [phone]/fim

## 2015-04-09 ENCOUNTER — Encounter: Payer: Self-pay | Admitting: Neurology

## 2015-04-09 ENCOUNTER — Ambulatory Visit (INDEPENDENT_AMBULATORY_CARE_PROVIDER_SITE_OTHER): Payer: BLUE CROSS/BLUE SHIELD | Admitting: Neurology

## 2015-04-09 VITALS — BP 154/96 | HR 74 | Resp 14 | Ht 67.0 in | Wt 215.4 lb

## 2015-04-09 DIAGNOSIS — R5382 Chronic fatigue, unspecified: Secondary | ICD-10-CM | POA: Diagnosis not present

## 2015-04-09 DIAGNOSIS — G35 Multiple sclerosis: Secondary | ICD-10-CM | POA: Diagnosis not present

## 2015-04-09 DIAGNOSIS — G4700 Insomnia, unspecified: Secondary | ICD-10-CM

## 2015-04-09 DIAGNOSIS — M79601 Pain in right arm: Secondary | ICD-10-CM | POA: Diagnosis not present

## 2015-04-09 DIAGNOSIS — R4189 Other symptoms and signs involving cognitive functions and awareness: Secondary | ICD-10-CM

## 2015-04-09 DIAGNOSIS — F32A Depression, unspecified: Secondary | ICD-10-CM

## 2015-04-09 DIAGNOSIS — F329 Major depressive disorder, single episode, unspecified: Secondary | ICD-10-CM | POA: Diagnosis not present

## 2015-04-09 DIAGNOSIS — H539 Unspecified visual disturbance: Secondary | ICD-10-CM

## 2015-04-09 DIAGNOSIS — G35A Relapsing-remitting multiple sclerosis: Secondary | ICD-10-CM

## 2015-04-09 MED ORDER — HYDROCODONE-ACETAMINOPHEN 5-325 MG PO TABS: 1.0000 | ORAL_TABLET | Freq: Four times a day (QID) | ORAL | Status: DC | PRN

## 2015-04-09 MED ORDER — MODAFINIL 200 MG PO TABS: ORAL_TABLET | ORAL | Status: DC

## 2015-04-09 MED ORDER — FLUOXETINE HCL 40 MG PO CAPS: 40.0000 mg | ORAL_CAPSULE | Freq: Every day | ORAL | Status: DC

## 2015-04-09 MED ORDER — CYCLOBENZAPRINE HCL 10 MG PO TABS: 10.0000 mg | ORAL_TABLET | Freq: Every day | ORAL | Status: DC

## 2015-04-09 MED ORDER — PHENTERMINE HCL 37.5 MG PO CAPS: 37.5000 mg | ORAL_CAPSULE | ORAL | Status: DC

## 2015-04-09 NOTE — Progress Notes (Signed)
[facility]  PATIENT: Veronica Frazier DOB: 1965/12/16  REFERRING CLINICIAN: Harlene Ramus HISTORY FROM: patient REASON FOR VISIT: MS   HISTORICAL  CHIEF COMPLAINT:  Chief Complaint  Patient presents with  . Multiple Sclerosis    Sts. she tolerates Tecfidera well.  She reports daytime fatigue is improved since stopping Amantadine and starting Phentermine.  She also continues Provigil.  Today she c/o more trouble going to and staying asleep--doesn't feel Ativan helps.  She used to take Flexeril 10mg , 1/2 tab qhs and this helped with sleep, but she has been out of Flexeril.  She has also been out of Prozac and sts. mood swings have been worse/fim    HISTORY OF PRESENT ILLNESS:  Veronica Frazier is a 49 year old woman with MS and related symptoms.   She reports more difficulty with sleep.   She has more right arm stiffness and pain and that disturbs her sleep.    MS History:   She presented in 2000  with pain and tingling in the left arm. At that time, she had an MRI of the brain performed that was concerning for multiple sclerosis. Additionally, she had a lumbar puncture performed with CSF that was consistent with MS. Initially, she was placed on Avonex injections weekly. Initially, she did well with the Avonex but then had an MRI that showed some progression. About 3 years ago she switched to Tecfidera. She tolerates Tecfidera fairly well, having just a little bit of flushing  Gait/strength/sensation:   Her balance is poor causing reduced gait and occasional falls.   She often trips.  Her gait fatigues easily and will occasionally use a wheelchair for long distance. . She denies significant weakness but has right arm  dysesthesia.  Vision:   She continues to note bilateral visual blurring. The symptoms are fairly constant and not affected by fatigue or heat.  She saw ophthalmologist last month and no MS related vision issue was found  Bladder:  She denies much  difficulties with her bladder. She has colitis and has some flare-ups with diarrhea and constipation.  Mood/ Cognition:  She feels mood is worse.   Mood did better on the combination of duloxetine and fluoxetine.  She is noting more crying spells. She remains on duloxetine but ran out of fluoxetine.. She is currently not seeing a psychiatrist or psychologist but did in the past.  She has stress with 2 new grandchildren and a child in school.   She has cognitive difficulties, especially with short-term memory, verbal fluency and executive function.   Fatigue/sleep:  She has a lot of difficulties with fatigue. She wakes up fatigued and feels even more so as the day goes on. She also has some daytime sleepiness, especially if she has a bad night's sleep. Adding phentermine helped her fatigue some and she remains on Provigil.   Sleep is impaired with difficulty falling asleep and staying asleep.   Therefore, her sleep is fragmented into several segments including one nap that add up to about 6 hours every day. She snores but has not had any gasping at night or pauses in her breathing. She is currently on modafinil and amantadine but they have not helped much as they did in the past. She was prescribed lorazepam 1 mg nightly which has helped her to fall asleep and helps him with his sleep maintenance, as well.   Ambien caused nightmares.   Pain:   She also reports a lot in the right arm and in the  midline back without radiation to the limbs. Gabapentin and hydrocodone have helped this pain some. She takes the hydrocodone up to 3 times a day but will have some days where she does not take any.  Flexeril helped the arm pain  REVIEW OF SYSTEMS:  Constitutional: No fevers, chills, sweats, or change in appetite   She has fatigue Eyes: Blurry vision and rare diplopia.  No eye pain Ear, nose and throat: No hearing loss, ear pain, nasal congestion, sore throat Cardiovascular: No chest pain, palpitations Respiratory:   No shortness of breath at rest or with exertion.   No wheezes   She snores GastrointestinaI: No nausea but recent diarrhea and constipation.    Colitis being treated Genitourinary:  No dysuria, urinary retention or frequency.  No nocturia. Musculoskeletal:  No neck pain.  Mild axial back pain.   Right arm pain Integumentary: No rash, pruritus, skin lesions Neurological: as above Psychiatric:  She reports more Depression.  No anxiety Endocrine: No palpitations, diaphoresis.    Increased hunger and  increased thirst Hematologic/Lymphatic:  No anemia, purpura, petechiae. Allergic/Immunologic: No itchy/runny eyes, nasal congestion, recent allergic reactions, rashes  ALLERGIES: No Known Allergies  HOME MEDICATIONS: Outpatient Prescriptions Prior to Visit  Medication Sig Dispense Refill  . aspirin-acetaminophen-caffeine (EXCEDRIN MIGRAINE) 250-250-65 MG per tablet Take 1 tablet by mouth 3 (three) times daily as needed.      . Dimethyl Fumarate 240 MG CPDR Take 1 capsule (240 mg total) by mouth 2 (two) times daily at 10 AM and 5 PM. 180 capsule 3  . DULoxetine (CYMBALTA) 60 MG capsule Take 60 mg by mouth daily.      Marland Kitchen FLUoxetine (PROZAC) 40 MG capsule Take 40 mg by mouth daily.      Marland Kitchen gabapentin (NEURONTIN) 300 MG capsule Take 300 mg by mouth 4 (four) times daily.      Marland Kitchen HYDROcodone-acetaminophen (NORCO/VICODIN) 5-325 MG per tablet Take 1 tablet by mouth every 6 (six) hours as needed for moderate pain. 120 tablet 0  . ibuprofen (ADVIL,MOTRIN) 800 MG tablet Take 800 mg by mouth once as needed. For pain      . lidocaine (LIDODERM) 5 % Place 1 patch onto the skin daily. Remove & Discard patch within 12 hours or as directed by MD; for pain     . LORazepam (ATIVAN) 1 MG tablet Take 1 tablet (1 mg total) by mouth at bedtime. 30 tablet 5  . meloxicam (MOBIC) 15 MG tablet Take 15 mg by mouth daily.      . modafinil (PROVIGIL) 200 MG tablet Take one-half tablet 3 times daily as needed. 135 tablet 0  .  Multiple Vitamins-Calcium (ONE-A-DAY WOMENS FORMULA PO) Take 1 tablet by mouth daily.      . ondansetron (ZOFRAN ODT) 4 MG disintegrating tablet  ODT q4 hours prn nausea/vomit 4 tablet 0  . phentermine 37.5 MG capsule Take 1 capsule (37.5 mg total) by mouth every morning. 30 capsule 5  . interferon beta-1a (AVONEX PEN) 30 MCG/0.5ML injection Inject 30 mcg into the muscle every 7 (seven) days.       No facility-administered medications prior to visit.    PAST MEDICAL HISTORY: Past Medical History  Diagnosis Date  . Multiple sclerosis   . Hypertension   . Vision abnormalities   . Neuropathy   . Stroke     PAST SURGICAL HISTORY: Past Surgical History  Procedure Laterality Date  . Abdominal hysterectomy    . Cholecystectomy    . Appendectomy    .  Tonsillectomy    . Breast surgery      FAMILY HISTORY: Family History  Problem Relation Age of Onset  . Hypertension Mother   . Hyperlipidemia Mother   . Colitis Mother   . Hypertension Father     SOCIAL HISTORY:  History   Social History  . Marital Status: Married    Spouse Name: N/A  . Number of Children: N/A  . Years of Education: N/A   Occupational History  . Not on file.   Social History Main Topics  . Smoking status: Former Games developer  . Smokeless tobacco: Not on file  . Alcohol Use: No  . Drug Use: No  . Sexual Activity: Yes    Birth Control/ Protection: Surgical   Other Topics Concern  . Not on file   Social History Narrative     PHYSICAL EXAM  Filed Vitals:   04/09/15 0900  BP: 154/96  Pulse: 74  Resp: 14  Height: 5\' 7"  (1.702 m)  Weight: 215 lb 6.4 oz (97.705 kg)    Body mass index is 33.73 kg/(m^2).   General: The patient is well-developed and well-nourished and in no acute distress  Eyes:  Funduscopic exam shows normal optic discs and retinal vessels.  Head/Neck: The neck is supple, no carotid bruits are noted.  Pharynx is crowded with Mallampati 2.The neck is nontender.  Skin:  Extremities are without significant edema.  Musculoskeletal:  Good ROM in right shoulder  Neurologic Exam  Mental status: The patient is alert and oriented x 3 at the time of the examination. The patient has apparent normal recent and remote memory, with an apparently normal attention span and concentration ability.   Speech is normal.  Cranial nerves: Extraocular movements are full. Pupils are equal, round, and reactive to light and accomodation.  Visual fields are full.  Facial symmetry is present. There is good facial sensation to soft touch bilaterally.Facial strength is normal.  Trapezius and sternocleidomastoid strength is normal. No dysarthria is noted.  The tongue is midline, and the patient has symmetric elevation of the soft palate. No obvious hearing deficits are noted.  Motor:  Muscle bulk and tone are normal. Strength is  5 / 5 in all 4 extremities.   Sensory: Sensory testing is intact to touchin all 4 extremities.  Coordination: Cerebellar testing reveals good finger-nose-finger and heel-to-shin bilaterally.  Gait and station: Station and gait are fairly normal. Tandem gait is wide. Romberg is negative.   Reflexes: Deep tendon reflexes are symmetric and normal bilaterally.    DIAGNOSTIC DATA (LABS, IMAGING, TESTING) - I reviewed patient records, labs, notes, testing and imaging myself where available.  Lab Results  Component Value Date   WBC 5.3 12/09/2014   HGB 13.4 12/09/2014   HCT 39.3 12/09/2014   MCV 92 12/09/2014   PLT 353 12/09/2014      Component Value Date/Time   NA 133* 11/19/2014 0705   K 3.3* 11/19/2014 0705   CL 98 11/19/2014 0705   CO2 25 11/19/2014 0705   GLUCOSE 179* 11/19/2014 0705   BUN 9 11/19/2014 0705   CREATININE 0.69 11/19/2014 0705   CALCIUM 8.3* 11/19/2014 0705   PROT 6.9 06/13/2011 0306   ALBUMIN 3.6 06/13/2011 0306   AST 56* 06/13/2011 0306   ALT 58* 06/13/2011 0306   ALKPHOS 88 06/13/2011 0306   BILITOT 0.5 06/13/2011 0306    GFRNONAA >90 11/19/2014 0705   GFRAA >90 11/19/2014 0705       ASSESSMENT AND PLAN  Multiple sclerosis, relapsing-remitting  Chronic fatigue  Depression  Insomnia  Visual disturbance  Disturbed cognition  Right arm pain   1.   Refill Provigil, phentermine, hydrocodone. Add cyclobenzaprine at night 2.  She will continue Tecfidera  3.   Lymph count was 800 last visit -- will check again next cit to make sure no severe lymphopenia as on Tecfidera.   4.   I'll place her back on Flexeril as she did better on the combination of Cymbalta and Flexeril and then on Cymbalta by itself. We discussed that if her mood issues and about better by next month and I would like her to see a psychiatrist or psychologist. 5.   Advised to try to stay active and exercises tolerated.  She will return to see me in 4 months or sooner if she has new or worsening symptoms.   Richard A. Epimenio Foot, MD, PhD 04/09/2015, 9:05 AM Certified in Neurology, Clinical Neurophysiology, Sleep Medicine, Pain Medicine and Neuroimaging  [facility] [address] [place], [place] [address] [phone]

## 2015-05-21 ENCOUNTER — Other Ambulatory Visit: Payer: Self-pay

## 2015-05-21 MED ORDER — DULOXETINE HCL 60 MG PO CPEP: 60.0000 mg | ORAL_CAPSULE | Freq: Every day | ORAL | Status: DC

## 2015-05-25 ENCOUNTER — Encounter: Payer: Self-pay | Admitting: *Deleted

## 2015-05-25 ENCOUNTER — Other Ambulatory Visit: Payer: Self-pay | Admitting: Neurology

## 2015-05-25 MED ORDER — HYDROCODONE-ACETAMINOPHEN 5-325 MG PO TABS: 1.0000 | ORAL_TABLET | Freq: Four times a day (QID) | ORAL | Status: DC | PRN

## 2015-05-25 NOTE — Progress Notes (Signed)
Hydrocodone rx. up front [facility]/fim 

## 2015-05-25 NOTE — Telephone Encounter (Signed)
Patient called requesting refill for HYDROcodone-acetaminophen (NORCO/VICODIN) 5-325 MG per tablet . Patient advised RX will be ready within 24 hours unless otherwise informed by RN °

## 2015-05-25 NOTE — Telephone Encounter (Signed)
Request entered, forwarded to provider for approval.  

## 2015-06-03 ENCOUNTER — Other Ambulatory Visit: Payer: Self-pay | Admitting: Neurology

## 2015-06-03 MED ORDER — DULOXETINE HCL 60 MG PO CPEP: 60.0000 mg | ORAL_CAPSULE | Freq: Every day | ORAL | Status: DC

## 2015-06-03 MED ORDER — GABAPENTIN 300 MG PO CAPS: 300.0000 mg | ORAL_CAPSULE | Freq: Four times a day (QID) | ORAL | Status: DC

## 2015-06-03 NOTE — Telephone Encounter (Signed)
Pt is requesting refills on gabapentin (NEURONTIN) 300 MG capsule, DULoxetine (CYMBALTA) 60 MG capsule, Send to [facility] in [place] on [address]. Would like these in 90 day prescriptions

## 2015-06-03 NOTE — Telephone Encounter (Signed)
Pharmacy has been updated.  I called patient back.  Got no answer.  VM full.  Unable to leave message.

## 2015-07-09 ENCOUNTER — Other Ambulatory Visit: Payer: Self-pay | Admitting: Neurology

## 2015-07-09 MED ORDER — MODAFINIL 200 MG PO TABS: ORAL_TABLET | ORAL | Status: DC

## 2015-07-09 MED ORDER — HYDROCODONE-ACETAMINOPHEN 5-325 MG PO TABS: 1.0000 | ORAL_TABLET | Freq: Four times a day (QID) | ORAL | Status: DC | PRN

## 2015-07-09 NOTE — Telephone Encounter (Signed)
Request entered, forwarded to provider for review.  

## 2015-07-09 NOTE — Telephone Encounter (Signed)
Patient called requesting refill for HYDROcodone-acetaminophen (NORCO/VICODIN) 5-325 MG per tablet and modafinil (PROVIGIL) 200 MG tablet 90 day. She states she still has a few weeks left of Provigil but she lives 45 min away and would like to pick up RX's at one time. She knows she can't fill it for a few weeks. Patient advised RX will be ready within 24 hours unless informed otherwise by RN.

## 2015-07-10 ENCOUNTER — Encounter: Payer: Self-pay | Admitting: *Deleted

## 2015-07-10 NOTE — Progress Notes (Signed)
Hydrocodone and Provigil rx's up front [facility]/fim 

## 2015-07-13 ENCOUNTER — Telehealth: Payer: Self-pay | Admitting: Neurology

## 2015-07-13 NOTE — Telephone Encounter (Signed)
Patient called requesting IV infusion today. She is very fatigues x 2weeks and is having HA's x 2 days. She said she has called and left several messages for [name]. She has not had this in out office before so I instructed her to talk with the RN 1st. Patient can be reached at [phone].Please call and advise.

## 2015-07-13 NOTE — Telephone Encounter (Signed)
I have spoken with Anjie this afternoon and per RAS, advised ok for one day IV Solumedrol 1gram.  I have checked with [name] in the infusion suite and she can do infusion if pt. comes in now.  Caylie sts. she will be here in about 30 min/fim

## 2015-08-03 ENCOUNTER — Telehealth: Payer: Self-pay | Admitting: Neurology

## 2015-08-03 MED ORDER — MODAFINIL 200 MG PO TABS: ORAL_TABLET | ORAL | Status: DC

## 2015-08-03 NOTE — Telephone Encounter (Signed)
Request entered, forwarded to provider for approval.  

## 2015-08-03 NOTE — Telephone Encounter (Signed)
Pt called and needs refill on modafinil (PROVIGIL) 200 MG tablet. She is transferring to another pharmacy and needs a new one sent to [facility] in [place], [place]. Rx can not be transferred from previous pharmacy.

## 2015-08-04 NOTE — Telephone Encounter (Signed)
I have spoken with Veronica Frazier this afternoon--she sts. [facility] thinks they can fill her full rx. tomorrow so she does not need anything right now/fim

## 2015-08-04 NOTE — Telephone Encounter (Addendum)
Pt called and is unable to have rx filled at [facility] because they do not have the amount in stock. She can get a 30 day supply but she will need a new Rx. Please call and advise [phone]. Pt states she still has original rx if it needs to be turned in to receive the new Rx.

## 2015-08-04 NOTE — Telephone Encounter (Signed)
Pt called and was wondering if her Rx was ready for pick up. This operator did not see a note indicating that it was. Pt takes rx to [facility] to have is filled. Please call and advise thank you

## 2015-08-04 NOTE — Telephone Encounter (Signed)
Pt called back . Please call and advise

## 2015-08-04 NOTE — Telephone Encounter (Signed)
Pt. presented to office and was given rx/fim

## 2015-08-05 ENCOUNTER — Other Ambulatory Visit: Payer: Self-pay | Admitting: Neurology

## 2015-08-05 MED ORDER — LORAZEPAM 1 MG PO TABS: 1.0000 mg | ORAL_TABLET | Freq: Every day | ORAL | Status: DC

## 2015-08-05 NOTE — Telephone Encounter (Signed)
Dr Sater is out of the office.  Request forwarded to WID for review.   

## 2015-08-05 NOTE — Telephone Encounter (Signed)
Patient requests refill LORazepam (ATIVAN) 1 MG tablet

## 2015-08-10 ENCOUNTER — Ambulatory Visit: Payer: BLUE CROSS/BLUE SHIELD | Admitting: Neurology

## 2015-08-25 ENCOUNTER — Other Ambulatory Visit: Payer: Self-pay | Admitting: Neurology

## 2015-08-25 MED ORDER — HYDROCODONE-ACETAMINOPHEN 5-325 MG PO TABS: 1.0000 | ORAL_TABLET | Freq: Four times a day (QID) | ORAL | Status: DC | PRN

## 2015-08-25 NOTE — Telephone Encounter (Signed)
Patient called to request refill of HYDROcodone-acetaminophen (NORCO/VICODIN) 5-325 MG per tablet °

## 2015-08-26 ENCOUNTER — Encounter: Payer: Self-pay | Admitting: *Deleted

## 2015-08-26 ENCOUNTER — Telehealth: Payer: Self-pay | Admitting: Neurology

## 2015-08-26 NOTE — Progress Notes (Signed)
Hydrocodone rx. up front [facility]/fim 

## 2015-08-26 NOTE — Telephone Encounter (Signed)
I have spoken with Veronica Frazier this afternoon and advised that at Feb. ov, RAS stopped the Amantadine and started her on Phentermine.  She verbalized understanding of same/fim

## 2015-08-26 NOTE — Telephone Encounter (Signed)
Pt called sts she realized she hasn't been taking amantadine. She doesn't remember if Dr Epimenio Foot stopped the medication or if she forgot to refill it. Please call and advise at [phone].

## 2015-08-31 ENCOUNTER — Encounter: Payer: Self-pay | Admitting: *Deleted

## 2015-08-31 ENCOUNTER — Other Ambulatory Visit: Payer: Self-pay

## 2015-08-31 MED ORDER — PHENTERMINE HCL 37.5 MG PO CAPS: 37.5000 mg | ORAL_CAPSULE | ORAL | Status: DC

## 2015-08-31 NOTE — Progress Notes (Signed)
Phentermine rx. up front [facility]/fim 

## 2015-09-03 ENCOUNTER — Encounter: Payer: Self-pay | Admitting: *Deleted

## 2015-09-03 ENCOUNTER — Ambulatory Visit: Payer: BLUE CROSS/BLUE SHIELD | Admitting: Neurology

## 2015-09-14 ENCOUNTER — Telehealth: Payer: Self-pay | Admitting: Neurology

## 2015-09-14 NOTE — Telephone Encounter (Signed)
I have spoken with pt. who sts. pharmacy needs Tecfidera rx.  Rx. was sent in on 09-03-15.  I have spoken with pharmacy and given verbal rx. today.  Pt. aware this has been taken care of/fim

## 2015-09-14 NOTE — Telephone Encounter (Signed)
Pt called sts she needs new RX for Tecfidera. She has been approved for $0 copay plan and needs RX. She has been out of medication since Friday 09/11/15. She said company sts they have sent request to Dr Epimenio Foot about 2 wks ago.

## 2015-09-17 ENCOUNTER — Emergency Department (HOSPITAL_BASED_OUTPATIENT_CLINIC_OR_DEPARTMENT_OTHER): Payer: PRIVATE HEALTH INSURANCE

## 2015-09-17 ENCOUNTER — Encounter (HOSPITAL_BASED_OUTPATIENT_CLINIC_OR_DEPARTMENT_OTHER): Payer: Self-pay

## 2015-09-17 ENCOUNTER — Emergency Department (HOSPITAL_BASED_OUTPATIENT_CLINIC_OR_DEPARTMENT_OTHER)
Admission: EM | Admit: 2015-09-17 | Discharge: 2015-09-17 | Disposition: A | Discharge status: Home / Self Care | Payer: PRIVATE HEALTH INSURANCE | Attending: Emergency Medicine | Admitting: Emergency Medicine

## 2015-09-17 DIAGNOSIS — Z87891 Personal history of nicotine dependence: Secondary | ICD-10-CM | POA: Insufficient documentation

## 2015-09-17 DIAGNOSIS — Z8673 Personal history of transient ischemic attack (TIA), and cerebral infarction without residual deficits: Secondary | ICD-10-CM | POA: Insufficient documentation

## 2015-09-17 DIAGNOSIS — Y998 Other external cause status: Secondary | ICD-10-CM | POA: Insufficient documentation

## 2015-09-17 DIAGNOSIS — Y9289 Other specified places as the place of occurrence of the external cause: Secondary | ICD-10-CM | POA: Insufficient documentation

## 2015-09-17 DIAGNOSIS — Z79899 Other long term (current) drug therapy: Secondary | ICD-10-CM | POA: Insufficient documentation

## 2015-09-17 DIAGNOSIS — W010XXA Fall on same level from slipping, tripping and stumbling without subsequent striking against object, initial encounter: Secondary | ICD-10-CM | POA: Insufficient documentation

## 2015-09-17 DIAGNOSIS — G35 Multiple sclerosis: Secondary | ICD-10-CM | POA: Insufficient documentation

## 2015-09-17 DIAGNOSIS — I10 Essential (primary) hypertension: Secondary | ICD-10-CM | POA: Insufficient documentation

## 2015-09-17 DIAGNOSIS — S42255A Nondisplaced fracture of greater tuberosity of left humerus, initial encounter for closed fracture: Secondary | ICD-10-CM | POA: Diagnosis not present

## 2015-09-17 DIAGNOSIS — Y9389 Activity, other specified: Secondary | ICD-10-CM | POA: Insufficient documentation

## 2015-09-17 DIAGNOSIS — S42302A Unspecified fracture of shaft of humerus, left arm, initial encounter for closed fracture: Secondary | ICD-10-CM

## 2015-09-17 DIAGNOSIS — S42252A Displaced fracture of greater tuberosity of left humerus, initial encounter for closed fracture: Secondary | ICD-10-CM | POA: Diagnosis not present

## 2015-09-17 DIAGNOSIS — Z791 Long term (current) use of non-steroidal anti-inflammatories (NSAID): Secondary | ICD-10-CM | POA: Insufficient documentation

## 2015-09-17 DIAGNOSIS — G629 Polyneuropathy, unspecified: Secondary | ICD-10-CM | POA: Insufficient documentation

## 2015-09-17 DIAGNOSIS — Z7982 Long term (current) use of aspirin: Secondary | ICD-10-CM | POA: Insufficient documentation

## 2015-09-17 MED ORDER — HYDROMORPHONE HCL 1 MG/ML IJ SOLN
1.0000 mg | Freq: Once | INTRAMUSCULAR | Status: AC
  Administered 2015-09-17: 1 mg via INTRAMUSCULAR
  Filled 2015-09-17: qty 1

## 2015-09-17 MED ORDER — ONDANSETRON HCL 4 MG PO TABS: 4.0000 mg | ORAL_TABLET | Freq: Four times a day (QID) | ORAL | Status: DC

## 2015-09-17 MED ORDER — OXYCODONE-ACETAMINOPHEN 5-325 MG PO TABS
2.0000 | ORAL_TABLET | ORAL | Status: DC | PRN
Start: 2015-09-17 — End: 2015-11-13

## 2015-09-17 NOTE — ED Provider Notes (Signed)
CSN: [id number]     Arrival date & time 09/17/15  1544 History   First MD Initiated Contact with Patient 09/17/15 1551     Chief Complaint  Patient presents with  . Fall     (Consider location/radiation/quality/duration/timing/severity/associated sxs/prior Treatment) Patient is a 49 y.o. female presenting with fall. The history is provided by the patient and the spouse. No language interpreter was used.  Fall Associated symptoms include arthralgias and myalgias.  Veronica Frazier is a 49 year old female with a history of MS, hypertension, vision abnormalities, neuropathy, and a stroke who presents for left shoulder pain after a trip and fall over a metal barrier onto a concrete floor at [employer] one hour ago. She states she fell straight onto the left shoulder but denies any head injury or loss of consciousness. No treatment prior to arrival. She denies any numbness or tingling.  Past Medical History  Diagnosis Date  . Multiple sclerosis (HCC)   . Hypertension   . Vision abnormalities   . Neuropathy (HCC)   . Stroke [facility])    Past Surgical History  Procedure Laterality Date  . Abdominal hysterectomy    . Cholecystectomy    . Appendectomy    . Tonsillectomy    . Breast surgery     Family History  Problem Relation Age of Onset  . Hypertension Mother   . Hyperlipidemia Mother   . Colitis Mother   . Hypertension Father    Social History  Substance Use Topics  . Smoking status: Former Games developer  . Smokeless tobacco: None  . Alcohol Use: No   OB History    No data available     Review of Systems  Musculoskeletal: Positive for myalgias and arthralgias.  Skin: Negative for color change and wound.      Allergies  Review of patient's allergies indicates no known allergies.  Home Medications   Prior to Admission medications   Medication Sig Start Date End Date Taking? Authorizing Provider  aspirin-acetaminophen-caffeine (EXCEDRIN MIGRAINE) [id number] MG per tablet Take 1  tablet by mouth 3 (three) times daily as needed.     Yes Historical Provider, MD  cyclobenzaprine (FLEXERIL) 10 MG tablet Take 1 tablet (10 mg total) by mouth at bedtime. 04/09/15  Yes Asa Lente, MD  Dimethyl Fumarate 240 MG CPDR Take 1 capsule (240 mg total) by mouth 2 (two) times daily at 10 AM and 5 PM. 04/01/15  Yes Asa Lente, MD  DULoxetine (CYMBALTA) 60 MG capsule Take 1 capsule (60 mg total) by mouth daily. 06/03/15  Yes Asa Lente, MD  FLUoxetine (PROZAC) 40 MG capsule Take 1 capsule (40 mg total) by mouth daily. 04/09/15  Yes Asa Lente, MD  gabapentin (NEURONTIN) 300 MG capsule Take 1 capsule (300 mg total) by mouth 4 (four) times daily. 06/03/15  Yes Asa Lente, MD  HYDROcodone-acetaminophen (NORCO/VICODIN) 5-325 MG tablet Take 1 tablet by mouth every 6 (six) hours as needed for moderate pain. 08/25/15  Yes Asa Lente, MD  ibuprofen (ADVIL,MOTRIN) 800 MG tablet Take 800 mg by mouth once as needed. For pain     Yes Historical Provider, MD  lidocaine (LIDODERM) 5 % Place 1 patch onto the skin daily. Remove & Discard patch within 12 hours or as directed by MD; for pain    Yes Historical Provider, MD  LORazepam (ATIVAN) 1 MG tablet Take 1 tablet (1 mg total) by mouth at bedtime. 08/05/15  Yes Marvel Plan, MD  LOSARTAN POTASSIUM PO  Take by mouth.   Yes Historical Provider, MD  meloxicam (MOBIC) 15 MG tablet Take 15 mg by mouth daily.     Yes Historical Provider, MD  modafinil (PROVIGIL) 200 MG tablet Take one-half tablet 3 times daily as needed. 08/03/15  Yes Asa Lente, MD  Multiple Vitamins-Calcium (ONE-A-DAY WOMENS FORMULA PO) Take 1 tablet by mouth daily.     Yes Historical Provider, MD  omeprazole (PRILOSEC) 20 MG capsule  03/02/15  Yes Historical Provider, MD  ondansetron (ZOFRAN ODT) 4 MG disintegrating tablet  ODT q4 hours prn nausea/vomit 11/19/14  Yes Rolan Bucco, MD  phentermine 37.5 MG capsule Take 1 capsule (37.5 mg total) by mouth every morning. 08/31/15   Yes Asa Lente, MD  ondansetron (ZOFRAN) 4 MG tablet Take 1 tablet (4 mg total) by mouth every 6 (six) hours. 09/17/15   Charlotte Brafford Patel-Mills, PA-C  oxyCODONE-acetaminophen (PERCOCET/ROXICET) 5-325 MG tablet Take 2 tablets by mouth every 4 (four) hours as needed for severe pain. 09/17/15   Shlok Raz Patel-Mills, PA-C   BP 108/64 mmHg  Pulse 94  Temp(Src) 97.7 F (36.5 C) (Oral)  Resp 18  Ht  (1.702 m)  Wt 195 lb (88.451 kg)  BMI 30.53 kg/m2  SpO2 90% Physical Exam  Constitutional: She is oriented to person, place, and time. She appears well-developed and well-nourished.  HENT:  Head: Normocephalic and atraumatic.  Eyes: Conjunctivae are normal.  Neck: Neck supple.  Cardiovascular: Normal rate.   Pulmonary/Chest: Effort normal. No respiratory distress.  Musculoskeletal: Normal range of motion.  Left arm: Tenderness to palpation of the proximal anterior humerus. No ecchymosis or redness. Able to flex and extend at the elbow. 2+ radial pulse. Less than 2 seconds capillary refill. 5/5 grip strength.  Neurological: She is alert and oriented to person, place, and time.  Skin: Skin is warm and dry.  Psychiatric: She has a normal mood and affect.    ED Course  Procedures (including critical care time) Labs Review Labs Reviewed - No data to display  Imaging Review Dg Shoulder Left  09/17/2015  CLINICAL DATA:  Tripped and fell over a rubber strip onto a metal barrier and onto concrete floor at [employer], LEFT shoulder pain, decreased range of motion EXAM: LEFT SHOULDER - 2+ VIEW COMPARISON:  None FINDINGS: Osseous demineralization. AC joint alignment normal. Nondisplaced greater tuberosity fracture. No glenohumeral dislocation. No additional fractures identified. Visualized LEFT ribs intact. IMPRESSION: Nondisplaced greater tuberosity fracture LEFT humerus. Electronically Signed   By: Ulyses Southward M.D.   On: 09/17/2015 16:10   I have personally reviewed and evaluated these image  results as part of my medical decision-making.   EKG Interpretation None      MDM   Final diagnoses:  Humerus fracture, left, closed, initial encounter   Patient presents for left shoulder pain after trip and fall. X-ray of the left shoulder shows nondisplaced greater tuberosity fracture of the left humerus. Patient was given shoulder immobilizer. I also gave her Percocet and Zofran. I discussed following up with orthopedics. I also discussed return precautions with the patient and she verbally agrees with the plan. Medications  HYDROmorphone (DILAUDID) injection 1 mg (1 mg Intramuscular Given 09/17/15 1624)      Catha Gosselin, PA-C 09/17/15 1921  Lyndal Pulley, MD 09/17/15 1924

## 2015-09-17 NOTE — ED Notes (Signed)
Pt unable to remove ring from finger.

## 2015-09-17 NOTE — Discharge Instructions (Signed)
Humerus Fracture Treated With Immobilization Follow-up with orthopedics. Take Percocet for pain. The humerus is the large bone in your upper arm. You have a broken (fractured) humerus. These fractures are easily diagnosed with X-rays. TREATMENT  Simple fractures which will heal without disability are treated with simple immobilization. Immobilization means you will wear a cast, splint, or sling. You have a fracture which will do well with immobilization. The fracture will heal well simply by being held in a good position until it is stable enough to begin range of motion exercises. Do not take part in activities which would further injure your arm.  HOME CARE INSTRUCTIONS   Put ice on the injured area.  Put ice in a plastic bag.  Place a towel between your skin and the bag.  Leave the ice on for 15-20 minutes, 03-04 times a day.  If you have a cast:  Do not scratch the skin under the cast using sharp or pointed objects.  Check the skin around the cast every day. You may put lotion on any red or sore areas.  Keep your cast dry and clean.  If you have a splint:  Wear the splint as directed.  Keep your splint dry and clean.  You may loosen the elastic around the splint if your fingers become numb, tingle, or turn cold or blue.  If you have a sling:  Wear the sling as directed.  Do not put pressure on any part of your cast or splint until it is fully hardened.  Your cast or splint can be protected during bathing with a plastic bag. Do not lower the cast or splint into water.  Only take over-the-counter or prescription medicines for pain, discomfort, or fever as directed by your caregiver.  Do range of motion exercises as instructed by your caregiver.  Follow up as directed by your caregiver. This is very important in order to avoid permanent injury or disability and chronic pain. SEEK IMMEDIATE MEDICAL CARE IF:   Your skin or nails in the injured arm turn blue or  gray.  Your arm feels cold or numb.  You develop severe pain in the injured arm.  You are having problems with the medicines you were given. MAKE SURE YOU:   Understand these instructions.  Will watch your condition.  Will get help right away if you are not doing well or get worse.   This information is not intended to replace advice given to you by your health care provider. Make sure you discuss any questions you have with your health care provider.   Document Released: 01/23/2001 Document Revised: 11/07/2014 Document Reviewed: 03/11/2015 Elsevier Interactive Patient Education [employer]

## 2015-09-17 NOTE — ED Notes (Signed)
Pt reports trip and fall on a metal barrier and onto concrete floor at [employer] on [address]. Reports left shoulder pain, decreased ROM.

## 2015-09-21 DIAGNOSIS — S42255A Nondisplaced fracture of greater tuberosity of left humerus, initial encounter for closed fracture: Secondary | ICD-10-CM | POA: Diagnosis not present

## 2015-09-24 ENCOUNTER — Encounter (HOSPITAL_BASED_OUTPATIENT_CLINIC_OR_DEPARTMENT_OTHER): Payer: Self-pay

## 2015-09-24 ENCOUNTER — Emergency Department (HOSPITAL_BASED_OUTPATIENT_CLINIC_OR_DEPARTMENT_OTHER): Payer: Medicare Other

## 2015-09-24 ENCOUNTER — Emergency Department (HOSPITAL_BASED_OUTPATIENT_CLINIC_OR_DEPARTMENT_OTHER)
Admission: EM | Admit: 2015-09-24 | Discharge: 2015-09-24 | Disposition: A | Discharge status: Home / Self Care | Payer: Medicare Other | Attending: Emergency Medicine | Admitting: Emergency Medicine

## 2015-09-24 DIAGNOSIS — Z87891 Personal history of nicotine dependence: Secondary | ICD-10-CM | POA: Diagnosis not present

## 2015-09-24 DIAGNOSIS — J189 Pneumonia, unspecified organism: Secondary | ICD-10-CM

## 2015-09-24 DIAGNOSIS — Z8673 Personal history of transient ischemic attack (TIA), and cerebral infarction without residual deficits: Secondary | ICD-10-CM | POA: Diagnosis not present

## 2015-09-24 DIAGNOSIS — Z79899 Other long term (current) drug therapy: Secondary | ICD-10-CM | POA: Insufficient documentation

## 2015-09-24 DIAGNOSIS — I10 Essential (primary) hypertension: Secondary | ICD-10-CM | POA: Diagnosis not present

## 2015-09-24 DIAGNOSIS — Z791 Long term (current) use of non-steroidal anti-inflammatories (NSAID): Secondary | ICD-10-CM | POA: Insufficient documentation

## 2015-09-24 DIAGNOSIS — G629 Polyneuropathy, unspecified: Secondary | ICD-10-CM | POA: Insufficient documentation

## 2015-09-24 DIAGNOSIS — R0602 Shortness of breath: Secondary | ICD-10-CM | POA: Diagnosis not present

## 2015-09-24 DIAGNOSIS — J159 Unspecified bacterial pneumonia: Secondary | ICD-10-CM | POA: Insufficient documentation

## 2015-09-24 DIAGNOSIS — R05 Cough: Secondary | ICD-10-CM | POA: Diagnosis present

## 2015-09-24 MED ORDER — ALBUTEROL SULFATE (2.5 MG/3ML) 0.083% IN NEBU
2.5000 mg | INHALATION_SOLUTION | RESPIRATORY_TRACT | Status: DC | PRN
  Administered 2015-09-24: 2.5 mg via RESPIRATORY_TRACT
  Filled 2015-09-24: qty 3

## 2015-09-24 MED ORDER — BENZONATATE 100 MG PO CAPS
100.0000 mg | ORAL_CAPSULE | Freq: Once | ORAL | Status: AC
  Administered 2015-09-24: 100 mg via ORAL
  Filled 2015-09-24: qty 1

## 2015-09-24 MED ORDER — BENZONATATE 100 MG PO CAPS: 100.0000 mg | ORAL_CAPSULE | Freq: Three times a day (TID) | ORAL | Status: DC

## 2015-09-24 MED ORDER — PREDNISONE 50 MG PO TABS
60.0000 mg | ORAL_TABLET | Freq: Once | ORAL | Status: AC
  Administered 2015-09-24: 60 mg via ORAL
  Filled 2015-09-24: qty 1

## 2015-09-24 MED ORDER — HYDROCODONE-ACETAMINOPHEN 5-325 MG PO TABS: 2.0000 | ORAL_TABLET | ORAL | Status: DC | PRN

## 2015-09-24 MED ORDER — LEVOFLOXACIN 500 MG PO TABS: 500.0000 mg | ORAL_TABLET | Freq: Every day | ORAL | Status: DC

## 2015-09-24 MED ORDER — LEVOFLOXACIN 750 MG PO TABS
750.0000 mg | ORAL_TABLET | Freq: Once | ORAL | Status: AC
  Administered 2015-09-24: 750 mg via ORAL
  Filled 2015-09-24: qty 1

## 2015-09-24 MED ORDER — HYDROCODONE-ACETAMINOPHEN 7.5-325 MG/15ML PO SOLN
10.0000 mL | Freq: Once | ORAL | Status: AC
  Administered 2015-09-24: 10 mL via ORAL
  Filled 2015-09-24: qty 15

## 2015-09-24 NOTE — Discharge Instructions (Signed)

## 2015-09-24 NOTE — ED Provider Notes (Signed)
CSN: [id number]     Arrival date & time 09/24/15  0844 History   First MD Initiated Contact with Patient 09/24/15 [id number]     Chief Complaint  Patient presents with  . Cough      HPI  Patient presents for evaluation of shortness of breath and cough. States she's had cough for last 2-3 days. No history of known lung disease. Does have a history of multiple sclerosis. States the cough is keeping her awake at night. No associated fever although she "feels cold". No GI complaints. No hemoptysis. No chest pain.  Past Medical History  Diagnosis Date  . Multiple sclerosis (HCC)   . Hypertension   . Vision abnormalities   . Neuropathy (HCC)   . Stroke [facility])    Past Surgical History  Procedure Laterality Date  . Abdominal hysterectomy    . Cholecystectomy    . Appendectomy    . Tonsillectomy    . Breast surgery     Family History  Problem Relation Age of Onset  . Hypertension Mother   . Hyperlipidemia Mother   . Colitis Mother   . Hypertension Father    Social History  Substance Use Topics  . Smoking status: Former Games developer  . Smokeless tobacco: None  . Alcohol Use: No   OB History    No data available     Review of Systems  Constitutional: Negative for fever, chills, diaphoresis, appetite change and fatigue.  HENT: Negative for mouth sores, sore throat and trouble swallowing.   Eyes: Negative for visual disturbance.  Respiratory: Positive for cough and shortness of breath. Negative for chest tightness and wheezing.   Cardiovascular: Negative for chest pain.  Gastrointestinal: Negative for nausea, vomiting, abdominal pain, diarrhea and abdominal distention.  Endocrine: Negative for polydipsia, polyphagia and polyuria.  Genitourinary: Negative for dysuria, frequency and hematuria.  Musculoskeletal: Negative for gait problem.  Skin: Negative for color change, pallor and rash.  Neurological: Negative for dizziness, syncope, light-headedness and headaches.  Hematological:  Does not bruise/bleed easily.  Psychiatric/Behavioral: Negative for behavioral problems and confusion.      Allergies  Review of patient's allergies indicates no known allergies.  Home Medications   Prior to Admission medications   Medication Sig Start Date End Date Taking? Authorizing Provider  aspirin-acetaminophen-caffeine (EXCEDRIN MIGRAINE) [phone] MG per tablet Take 1 tablet by mouth 3 (three) times daily as needed.      Historical Provider, MD  benzonatate (TESSALON) 100 MG capsule Take 1 capsule (100 mg total) by mouth every 8 (eight) hours. 09/24/15   Rolland Porter, MD  cyclobenzaprine (FLEXERIL) 10 MG tablet Take 1 tablet (10 mg total) by mouth at bedtime. 04/09/15   Asa Lente, MD  Dimethyl Fumarate 240 MG CPDR Take 1 capsule (240 mg total) by mouth 2 (two) times daily at 10 AM and 5 PM. 04/01/15   Asa Lente, MD  DULoxetine (CYMBALTA) 60 MG capsule Take 1 capsule (60 mg total) by mouth daily. 06/03/15   Asa Lente, MD  FLUoxetine (PROZAC) 40 MG capsule Take 1 capsule (40 mg total) by mouth daily. 04/09/15   Asa Lente, MD  gabapentin (NEURONTIN) 300 MG capsule Take 1 capsule (300 mg total) by mouth 4 (four) times daily. 06/03/15   Asa Lente, MD  HYDROcodone-acetaminophen (NORCO/VICODIN) 5-325 MG tablet Take 2 tablets by mouth every 4 (four) hours as needed. 09/24/15   Rolland Porter, MD  ibuprofen (ADVIL,MOTRIN) 800 MG tablet Take 800 mg by mouth once  as needed. For pain      Historical Provider, MD  levofloxacin (LEVAQUIN) 500 MG tablet Take 1 tablet (500 mg total) by mouth daily. 09/24/15   Rolland Porter, MD  lidocaine (LIDODERM) 5 % Place 1 patch onto the skin daily. Remove & Discard patch within 12 hours or as directed by MD; for pain     Historical Provider, MD  LORazepam (ATIVAN) 1 MG tablet Take 1 tablet (1 mg total) by mouth at bedtime. 08/05/15   Marvel Plan, MD  LOSARTAN POTASSIUM PO Take by mouth.    Historical Provider, MD  meloxicam (MOBIC) 15 MG tablet Take  15 mg by mouth daily.      Historical Provider, MD  modafinil (PROVIGIL) 200 MG tablet Take one-half tablet 3 times daily as needed. 08/03/15   Asa Lente, MD  Multiple Vitamins-Calcium (ONE-A-DAY WOMENS FORMULA PO) Take 1 tablet by mouth daily.      Historical Provider, MD  omeprazole (PRILOSEC) 20 MG capsule  03/02/15   Historical Provider, MD  ondansetron (ZOFRAN ODT) 4 MG disintegrating tablet 4mg  ODT q4 hours prn nausea/vomit 11/19/14   Rolan Bucco, MD  ondansetron (ZOFRAN) 4 MG tablet Take 1 tablet (4 mg total) by mouth every 6 (six) hours. 09/17/15   Hanna Patel-Mills, PA-C  oxyCODONE-acetaminophen (PERCOCET/ROXICET) 5-325 MG tablet Take 2 tablets by mouth every 4 (four) hours as needed for severe pain. 09/17/15   Hanna Patel-Mills, PA-C  phentermine 37.5 MG capsule Take 1 capsule (37.5 mg total) by mouth every morning. 08/31/15   Asa Lente, MD   BP 116/74 mmHg  Pulse 84  Temp(Src) 98 F (36.7 C) (Oral)  Resp 22  Ht 5\' 7"  (1.702 m)  Wt 190 lb (86.183 kg)  BMI 29.75 kg/m2  SpO2 97% Physical Exam  Constitutional: She is oriented to person, place, and time. She appears well-developed and well-nourished. No distress.  HENT:  Head: Normocephalic.  Eyes: Conjunctivae are normal. Pupils are equal, round, and reactive to light. No scleral icterus.  Neck: Normal range of motion. Neck supple. No thyromegaly present.  Cardiovascular: Normal rate and regular rhythm.  Exam reveals no gallop and no friction rub.   No murmur heard. Pulmonary/Chest: Effort normal and breath sounds normal. No respiratory distress. She has no wheezes. She has no rales.  Mild prolongation. Wheezing noted. Scattered rhonchi left mid upper lung. No diminished basilar breath sounds. Not tachypneic, hypoxemic, or febrile on recheck vitals by me in the room  Abdominal: Soft. Bowel sounds are normal. She exhibits no distension. There is no tenderness. There is no rebound.  Musculoskeletal: Normal range of motion.   Neurological: She is alert and oriented to person, place, and time.  Skin: Skin is warm and dry. No rash noted.  Psychiatric: She has a normal mood and affect. Her behavior is normal.    ED Course  Procedures (including critical care time) Labs Review Labs Reviewed - No data to display  Imaging Review Dg Chest 2 View  09/24/2015  CLINICAL DATA:  Shortness of Breath EXAM: CHEST - 2 VIEW COMPARISON:  06/13/2011 FINDINGS: Cardiac shadow is within normal limits. Patchy infiltrate is noted within the right upper lobe. No sizable effusion is noted. No bony abnormality is seen. IMPRESSION: Right upper lobe pneumonia. Followup PA and lateral chest X-ray is recommended in 3-4 weeks following trial of antibiotic therapy to ensure resolution and exclude underlying malignancy. Electronically Signed   By: Alcide Clever M.D.   On: 09/24/2015 09:54  I have personally reviewed and evaluated these images and lab results as part of my medical decision-making.   EKG Interpretation None      MDM   Final diagnoses:  CAP (community acquired pneumonia)    Not hypoxemic or febrile. Appropriate for outpatient treatment for this acute community-acquired pneumonia    Rolland Porter, MD 09/24/15 1029

## 2015-09-24 NOTE — ED Notes (Signed)
Pt reports cough since Monday with increasing SHOB. Pt hyperventilating upon arrival to room 11. Pt clear upon auscultation. Sats 92% on RA. Denies any history with COPD and asthma.

## 2015-09-24 NOTE — ED Notes (Signed)
MD at bedside. 

## 2015-09-24 NOTE — ED Notes (Signed)
Pt instructed to take slow, deep breaths. Pt following directions at this time.

## 2015-09-30 ENCOUNTER — Other Ambulatory Visit: Payer: Self-pay | Admitting: Neurology

## 2015-09-30 MED ORDER — HYDROCODONE-ACETAMINOPHEN 5-325 MG PO TABS: 2.0000 | ORAL_TABLET | Freq: Four times a day (QID) | ORAL | Status: DC | PRN

## 2015-09-30 NOTE — Telephone Encounter (Signed)
Patient called to request refill of HYDROcodone-acetaminophen (NORCO/VICODIN) 5-325 MG tablet °

## 2015-10-01 ENCOUNTER — Encounter: Payer: Self-pay | Admitting: *Deleted

## 2015-10-01 NOTE — Progress Notes (Signed)
Hydrocodone rx. up front [facility]/fim 

## 2015-10-13 ENCOUNTER — Encounter: Payer: Self-pay | Admitting: Neurology

## 2015-10-13 ENCOUNTER — Ambulatory Visit (INDEPENDENT_AMBULATORY_CARE_PROVIDER_SITE_OTHER): Payer: Medicare Other | Admitting: Neurology

## 2015-10-13 VITALS — BP 124/84 | HR 80 | Resp 16 | Ht 67.0 in | Wt 212.2 lb

## 2015-10-13 DIAGNOSIS — G35 Multiple sclerosis: Secondary | ICD-10-CM | POA: Diagnosis not present

## 2015-10-13 DIAGNOSIS — F329 Major depressive disorder, single episode, unspecified: Secondary | ICD-10-CM

## 2015-10-13 DIAGNOSIS — R5382 Chronic fatigue, unspecified: Secondary | ICD-10-CM

## 2015-10-13 DIAGNOSIS — H539 Unspecified visual disturbance: Secondary | ICD-10-CM

## 2015-10-13 DIAGNOSIS — G35A Relapsing-remitting multiple sclerosis: Secondary | ICD-10-CM

## 2015-10-13 DIAGNOSIS — R269 Unspecified abnormalities of gait and mobility: Secondary | ICD-10-CM | POA: Diagnosis not present

## 2015-10-13 DIAGNOSIS — F32A Depression, unspecified: Secondary | ICD-10-CM

## 2015-10-13 DIAGNOSIS — R4189 Other symptoms and signs involving cognitive functions and awareness: Secondary | ICD-10-CM

## 2015-10-13 NOTE — Progress Notes (Signed)
[facility]  PATIENT: Veronica Frazier DOB: June 13, 1966  REFERRING CLINICIAN: Harlene Ramus HISTORY FROM: patient REASON FOR VISIT: MS   HISTORICAL  CHIEF COMPLAINT:  Chief Complaint  Patient presents with  . Multiple Sclerosis    Sts. she continues to tolerate Tecfidera well.  Sts. she is sleeping better since bedtime dose of Flexeril was added.  Since last ov, she has been treated for pneumonia, and also she fell at [employer], (tripped over metal railing), and suffered fx. left shoulder./fim  . Left Shoulder Injury    Sts. she is taking Hydrocodone during the day, and Tylenol #3 that Dr. August Saucer at [facility] gave for fx. shoulder, at hs/fim    HISTORY OF PRESENT ILLNESS:  Veronica Frazier is a 49 year old woman with MS and related symptoms.   She reports more difficulty with sleep.   She fell at [facility] and broke her left shoulder.    MS History:   She presented in 2000  with pain and tingling in the left arm. At that time, she had an MRI of the brain performed that was concerning for multiple sclerosis. Additionally, she had a lumbar puncture performed with CSF that was consistent with MS. Initially, she was placed on Avonex injections weekly. Initially, she did well with the Avonex but then had an MRI that showed some progression. About 3 years ago she switched to Tecfidera. She tolerates Tecfidera fairly well, having just a little bit of flushing  Gait/strength/sensation:   Her balance is poor and she has reduced gait and occasional falls.  A recnt fall led toher breaking her shoulder.    She often trips.  She tires out quickly with walking.    She veers left often when she walks.    She sometimes hots the left side of a doorframe.  . She denies significant weakness.   She was having right arm dysesthesia but it is better again..  Vision:   She continues to note bilateral visual blurring. Vision seems wore to left.   Depth perception is often off.      Bladder:  She denies much difficulties with her bladder. She has colitis but has fewer flare-ups recently  Mood/ Cognition:  She feels mood is better on duloxetine and fluoxetine.   Anxiety is mild.    She has cognitive difficulties, especially with short-term memory and executive function.   Verbal fluency seems better recently.     Fatigue/sleep:  She has a lot of difficulties with fatigue still.  She also has some daytime sleepiness, especially if she has a bad night's sleep. Adding phentermine helped her fatigue some and she remains on Provigil.   Sleep is better than last visit since starting night time flexeril and continuing lorazepam..  She needs fewer naps now.    She snores but has not had any gasping at night or pauses in her breathing.     REVIEW OF SYSTEMS:  Constitutional: No fevers, chills, sweats, or change in appetite   She has fatigue Eyes: Blurry vision and rare diplopia.  No eye pain Ear, nose and throat: No hearing loss, ear pain, nasal congestion, sore throat Cardiovascular: No chest pain, palpitations Respiratory:  No shortness of breath at rest or with exertion.   No wheezes   She snores GastrointestinaI: No nausea but recent diarrhea and constipation.    Colitis being treated Genitourinary:  No dysuria, urinary retention or frequency.  No nocturia. Musculoskeletal:  No neck pain.  Mild axial back  pain.   Right arm pain Integumentary: No rash, pruritus, skin lesions Neurological: as above Psychiatric:  She reports more Depression.  No anxiety Endocrine: No palpitations, diaphoresis.    Increased hunger and  increased thirst Hematologic/Lymphatic:  No anemia, purpura, petechiae. Allergic/Immunologic: No itchy/runny eyes, nasal congestion, recent allergic reactions, rashes  ALLERGIES: No Known Allergies  HOME MEDICATIONS: Outpatient Prescriptions Prior to Visit  Medication Sig Dispense Refill  . aspirin-acetaminophen-caffeine (EXCEDRIN MIGRAINE) 250-250-65 MG  per tablet Take 1 tablet by mouth 3 (three) times daily as needed.      . cyclobenzaprine (FLEXERIL) 10 MG tablet Take 1 tablet (10 mg total) by mouth at bedtime. 90 tablet 3  . Dimethyl Fumarate 240 MG CPDR Take 1 capsule (240 mg total) by mouth 2 (two) times daily at 10 AM and 5 PM. 180 capsule 3  . DULoxetine (CYMBALTA) 60 MG capsule Take 1 capsule (60 mg total) by mouth daily. 90 capsule 1  . FLUoxetine (PROZAC) 40 MG capsule Take 1 capsule (40 mg total) by mouth daily. 90 capsule 3  . gabapentin (NEURONTIN) 300 MG capsule Take 1 capsule (300 mg total) by mouth 4 (four) times daily. 360 capsule 1  . HYDROcodone-acetaminophen (NORCO/VICODIN) 5-325 MG tablet Take 2 tablets by mouth every 6 (six) hours as needed. 120 tablet 0  . ibuprofen (ADVIL,MOTRIN) 800 MG tablet Take 800 mg by mouth once as needed. For pain      . lidocaine (LIDODERM) 5 % Place 1 patch onto the skin daily. Remove & Discard patch within 12 hours or as directed by MD; for pain     . LORazepam (ATIVAN) 1 MG tablet Take 1 tablet (1 mg total) by mouth at bedtime. 30 tablet 0  . LOSARTAN POTASSIUM PO Take by mouth.    . meloxicam (MOBIC) 15 MG tablet Take 15 mg by mouth daily.      . modafinil (PROVIGIL) 200 MG tablet Take one-half tablet 3 times daily as needed. 135 tablet 1  . Multiple Vitamins-Calcium (ONE-A-DAY WOMENS FORMULA PO) Take 1 tablet by mouth daily.      Marland Kitchen omeprazole (PRILOSEC) 20 MG capsule     . phentermine 37.5 MG capsule Take 1 capsule (37.5 mg total) by mouth every morning. 90 capsule 1  . benzonatate (TESSALON) 100 MG capsule Take 1 capsule (100 mg total) by mouth every 8 (eight) hours. (Patient not taking: Reported on 10/13/2015) 21 capsule 0  . levofloxacin (LEVAQUIN) 500 MG tablet Take 1 tablet (500 mg total) by mouth daily. (Patient not taking: Reported on 10/13/2015) 10 tablet 0  . ondansetron (ZOFRAN ODT) 4 MG disintegrating tablet 4mg  ODT q4 hours prn nausea/vomit (Patient not taking: Reported on  10/13/2015) 4 tablet 0  . ondansetron (ZOFRAN) 4 MG tablet Take 1 tablet (4 mg total) by mouth every 6 (six) hours. (Patient not taking: Reported on 10/13/2015) 12 tablet 0  . oxyCODONE-acetaminophen (PERCOCET/ROXICET) 5-325 MG tablet Take 2 tablets by mouth every 4 (four) hours as needed for severe pain. (Patient not taking: Reported on 10/13/2015) 15 tablet 0   No facility-administered medications prior to visit.    PAST MEDICAL HISTORY: Past Medical History  Diagnosis Date  . Multiple sclerosis (HCC)   . Hypertension   . Vision abnormalities   . Neuropathy (HCC)   . Stroke [facility])     PAST SURGICAL HISTORY: Past Surgical History  Procedure Laterality Date  . Abdominal hysterectomy    . Cholecystectomy    . Appendectomy    .  Tonsillectomy    . Breast surgery      FAMILY HISTORY: Family History  Problem Relation Age of Onset  . Hypertension Mother   . Hyperlipidemia Mother   . Colitis Mother   . Hypertension Father     SOCIAL HISTORY:  Social History   Social History  . Marital Status: Married    Spouse Name: N/A  . Number of Children: N/A  . Years of Education: N/A   Occupational History  . Not on file.   Social History Main Topics  . Smoking status: Former Games developer  . Smokeless tobacco: Not on file  . Alcohol Use: No  . Drug Use: No  . Sexual Activity: Yes    Birth Control/ Protection: Surgical   Other Topics Concern  . Not on file   Social History Narrative     PHYSICAL EXAM  Filed Vitals:   10/13/15 1103  BP: 124/84  Pulse: 80  Resp: 16  Height: 5\' 7"  (1.702 m)  Weight: 212 lb 3.2 oz (96.253 kg)    Body mass index is 33.23 kg/(m^2).   General: The patient is well-developed and well-nourished and in no acute distress   Skin: Extremities are without significant edema.  Musculoskeletal:  Has sling for left arm  Neurologic Exam  Mental status: The patient is alert and oriented x 3 at the time of the examination. The patient has  apparent normal recent and remote memory, with an apparently normal attention span and concentration ability.   Speech is normal.  Cranial nerves: Extraocular movements are full.   There is good facial sensation to soft touch bilaterally.Facial strength is normal.  Trapezius and sternocleidomastoid strength is normal. No dysarthria is noted.  The tongue is midline, and the patient has symmetric elevation of the soft palate. No obvious hearing deficits are noted.  Motor:  Muscle bulk and tone are normal. Strength is  5 / 5 in all 4 extremities.   Sensory: Sensory testing is intact to touch in all 4 extremities.  Coordination: Cerebellar testing reveals good finger-nose-finger and heel-to-shin bilaterally.  Gait and station: Station and gait are fairly normal. Tandem gait is wide. Romberg is negative.   Reflexes: Deep tendon reflexes are symmetric and normal bilaterally.     DIAGNOSTIC DATA (LABS, IMAGING, TESTING) - I reviewed patient records, labs, notes, testing and imaging myself where available.  Lab Results  Component Value Date   WBC 5.3 12/09/2014   HGB 13.4 12/09/2014   HCT 39.3 12/09/2014   MCV 92 12/09/2014   PLT 353 12/09/2014      Component Value Date/Time   NA 133* 11/19/2014 0705   K 3.3* 11/19/2014 0705   CL 98 11/19/2014 0705   CO2 25 11/19/2014 0705   GLUCOSE 179* 11/19/2014 0705   BUN 9 11/19/2014 0705   CREATININE 0.69 11/19/2014 0705   CALCIUM 8.3* 11/19/2014 0705   PROT 6.9 06/13/2011 0306   ALBUMIN 3.6 06/13/2011 0306   AST 56* 06/13/2011 0306   ALT 58* 06/13/2011 0306   ALKPHOS 88 06/13/2011 0306   BILITOT 0.5 06/13/2011 0306   GFRNONAA >90 11/19/2014 0705   GFRAA >90 11/19/2014 0705       ASSESSMENT AND PLAN  Multiple sclerosis, relapsing-remitting (HCC) - Plan: CBC with Differential/Platelet, MR Brain W Wo Contrast  Chronic fatigue  Depression  Visual disturbance  Disturbed cognition  Gait disturbance - Plan: MR Brain W Wo  Contrast    1.    She will continue Tecfidera.  Check CBC with diff today.   We will check an MRI of the brain with and without contrast to assess for the possibility of subclinical progression. If present, consider a different disease modifying therapy. 2.  Continue Provigil, phentermine, hydrocodone and cyclobenzaprine 3.  Continue Prozac and  Cymbalta for mood 4.   Advised to try to stay active and exercises tolerated. 5.   She will return to see me in 4 months or sooner if she has new or worsening symptoms.   Glennice Marcos A. Epimenio Foot, MD, PhD 10/13/2015, 11:25 AM Certified in Neurology, Clinical Neurophysiology, Sleep Medicine, Pain Medicine and Neuroimaging  [facility] [address], [address] [place], [place] [address] [phone]

## 2015-10-14 ENCOUNTER — Telehealth: Payer: Self-pay | Admitting: *Deleted

## 2015-10-14 LAB — CBC WITH DIFFERENTIAL/PLATELET
BASOS: 1 %
Basophils Absolute: 0 10*3/uL (ref 0.0–0.2)
EOS (ABSOLUTE): 0.2 10*3/uL (ref 0.0–0.4)
EOS: 6 %
HEMATOCRIT: 39.2 % (ref 34.0–46.6)
HEMOGLOBIN: 13.1 g/dL (ref 11.1–15.9)
IMMATURE GRANS (ABS): 0 10*3/uL (ref 0.0–0.1)
Immature Granulocytes: 1 %
LYMPHS ABS: 0.6 10*3/uL — AB (ref 0.7–3.1)
LYMPHS: 16 %
MCH: 32 pg (ref 26.6–33.0)
MCHC: 33.4 g/dL (ref 31.5–35.7)
MCV: 96 fL (ref 79–97)
MONOCYTES: 10 %
Monocytes Absolute: 0.4 10*3/uL (ref 0.1–0.9)
NEUTROS ABS: 2.7 10*3/uL (ref 1.4–7.0)
Neutrophils: 66 %
Platelets: 229 10*3/uL (ref 150–379)
RBC: 4.1 x10E6/uL (ref 3.77–5.28)
RDW: 14.1 % (ref 12.3–15.4)
WBC: 4 10*3/uL (ref 3.4–10.8)

## 2015-10-14 NOTE — Telephone Encounter (Signed)
I have spoken with Abagael, and per RAS, advised that labs done in our office are ok--lymphocytes are a little low, due to Tecfidera, but not too low to cause concern.  Will recheck labs at next ov. She verbalized understanding of same/fim

## 2015-10-14 NOTE — Telephone Encounter (Signed)
-----   Message from Asa Lente, MD sent at 10/14/2015  8:43 AM EST ----- Please let her know that her labs are okay. The lymphocyte count is a little bit low which is common with Tecfidera. As long as it does not get too low we don't have to worry. We'll  recheck  next visit.

## 2015-10-27 ENCOUNTER — Inpatient Hospital Stay: Admission: RE | Admit: 2015-10-27 | Payer: Medicare Other | Source: Ambulatory Visit

## 2015-11-03 ENCOUNTER — Ambulatory Visit: Payer: Medicare Other | Admitting: Podiatry

## 2015-11-04 ENCOUNTER — Encounter: Payer: Medicare Other | Admitting: Podiatry

## 2015-11-10 ENCOUNTER — Encounter: Payer: Self-pay | Admitting: *Deleted

## 2015-11-10 ENCOUNTER — Other Ambulatory Visit: Payer: Self-pay | Admitting: Neurology

## 2015-11-10 MED ORDER — MODAFINIL 200 MG PO TABS: ORAL_TABLET | ORAL | Status: DC

## 2015-11-10 MED ORDER — HYDROCODONE-ACETAMINOPHEN 5-325 MG PO TABS: 2.0000 | ORAL_TABLET | Freq: Four times a day (QID) | ORAL | Status: DC | PRN

## 2015-11-10 NOTE — Progress Notes (Signed)
Hydrocodone and Provigil rx's up front [facility]/fim

## 2015-11-10 NOTE — Telephone Encounter (Signed)
Request has been forwarded to provider for review.  

## 2015-11-10 NOTE — Telephone Encounter (Signed)
Pt needs refill on modafinil (PROVIGIL) 200 MG tablet and HYDROcodone-acetaminophen (NORCO/VICODIN) 5-325 MG tablet. Thank you

## 2015-11-11 DIAGNOSIS — S42255D Nondisplaced fracture of greater tuberosity of left humerus, subsequent encounter for fracture with routine healing: Secondary | ICD-10-CM | POA: Diagnosis not present

## 2015-11-12 ENCOUNTER — Other Ambulatory Visit: Payer: Self-pay

## 2015-11-12 ENCOUNTER — Telehealth: Payer: Self-pay | Admitting: Neurology

## 2015-11-12 MED ORDER — MELOXICAM 15 MG PO TABS: 15.0000 mg | ORAL_TABLET | Freq: Every day | ORAL | Status: DC

## 2015-11-12 MED ORDER — PHENTERMINE HCL 37.5 MG PO CAPS: 37.5000 mg | ORAL_CAPSULE | ORAL | Status: DC

## 2015-11-12 NOTE — Telephone Encounter (Signed)
Pt called requesting refill for meloxicam (MOBIC) 15 MG tablet and phentermine 37.5 MG capsule called to [facility] on [address] in [place]. Pt said modafinil (PROVIGIL) 200 MG tablet needs PA. Pt also has new insurance thru [facility] [employer]:  Member # [id number]  BIN# [id number]  PCN part D  GRP # [id number]

## 2015-11-12 NOTE — Telephone Encounter (Signed)
Rx request has been forwarded to provider for review.  New ins has been contacted regarding Provigil.  Request is under review Ref # [id number]  [employer] ([employer] has approved the request for coverage on Modafinil effective until 10/30/2016

## 2015-11-13 ENCOUNTER — Encounter: Payer: Self-pay | Admitting: Podiatry

## 2015-11-13 ENCOUNTER — Encounter: Payer: Self-pay | Admitting: *Deleted

## 2015-11-13 ENCOUNTER — Ambulatory Visit (INDEPENDENT_AMBULATORY_CARE_PROVIDER_SITE_OTHER): Payer: PPO

## 2015-11-13 ENCOUNTER — Ambulatory Visit (INDEPENDENT_AMBULATORY_CARE_PROVIDER_SITE_OTHER): Payer: PPO | Admitting: Podiatry

## 2015-11-13 VITALS — BP 133/93 | HR 69 | Resp 12

## 2015-11-13 DIAGNOSIS — M79671 Pain in right foot: Secondary | ICD-10-CM

## 2015-11-13 DIAGNOSIS — M79672 Pain in left foot: Secondary | ICD-10-CM | POA: Diagnosis not present

## 2015-11-13 DIAGNOSIS — M722 Plantar fascial fibromatosis: Secondary | ICD-10-CM | POA: Diagnosis not present

## 2015-11-13 MED ORDER — TRIAMCINOLONE ACETONIDE 10 MG/ML IJ SUSP
10.0000 mg | Freq: Once | INTRAMUSCULAR | Status: AC
  Administered 2015-11-13: 10 mg

## 2015-11-13 NOTE — Progress Notes (Signed)
   Subjective:    Patient ID: Veronica Frazier, female    DOB: July 19, 1966, 50 y.o.   MRN: [id number]  HPI  Pt stated have history plantar fasciitis  b/l heel is been hurting for 9 months. Heels are getting worse especially first step in the morning. Tried cortisone shot but no relief.  Review of Systems  All other systems reviewed and are negative.      Objective:   Physical Exam        Assessment & Plan:

## 2015-11-13 NOTE — Patient Instructions (Signed)
Achilles Tendinitis  with Rehab Achilles tendinitis is a disorder of the Achilles tendon. The Achilles tendon connects the large calf muscles (Gastrocnemius and Soleus) to the heel bone (calcaneus). This tendon is sometimes called the heel cord. It is important for pushing-off and standing on your toes and is important for walking, running, or jumping. Tendinitis is often caused by overuse and repetitive microtrauma. SYMPTOMS  Pain, tenderness, swelling, warmth, and redness may occur over the Achilles tendon even at rest.  Pain with pushing off, or flexing or extending the ankle.  Pain that is worsened after or during activity. CAUSES   Overuse sometimes seen with rapid increase in exercise programs or in sports requiring running and jumping.  Poor physical conditioning (strength and flexibility or endurance).  Running sports, especially training running down hills.  Inadequate warm-up before practice or play or failure to stretch before participation.  Injury to the tendon. PREVENTION   Warm up and stretch before practice or competition.  Allow time for adequate rest and recovery between practices and competition.  Keep up conditioning.  Keep up ankle and leg flexibility.  Improve or keep muscle strength and endurance.  Improve cardiovascular fitness.  Use proper technique.  Use proper equipment (shoes, skates).  To help prevent recurrence, taping, protective strapping, or an adhesive bandage may be recommended for several weeks after healing is complete. PROGNOSIS   Recovery may take weeks to several months to heal.  Longer recovery is expected if symptoms have been prolonged.  Recovery is usually quicker if the inflammation is due to a direct blow as compared with overuse or sudden strain. RELATED COMPLICATIONS   Healing time will be prolonged if the condition is not correctly treated. The injury must be given plenty of time to heal.  Symptoms can reoccur if  activity is resumed too soon.  Untreated, tendinitis may increase the risk of tendon rupture requiring additional time for recovery and possibly surgery. TREATMENT   The first treatment consists of rest anti-inflammatory medication, and ice to relieve the pain.  Stretching and strengthening exercises after resolution of pain will likely help reduce the risk of recurrence. Referral to a physical therapist or athletic trainer for further evaluation and treatment may be helpful.  A walking boot or cast may be recommended to rest the Achilles tendon. This can help break the cycle of inflammation and microtrauma.  Arch supports (orthotics) may be prescribed or recommended by your caregiver as an adjunct to therapy and rest.  Surgery to remove the inflamed tendon lining or degenerated tendon tissue is rarely necessary and has shown less than predictable results. MEDICATION   Nonsteroidal anti-inflammatory medications, such as aspirin and ibuprofen, may be used for pain and inflammation relief. Do not take within 7 days before surgery. Take these as directed by your caregiver. Contact your caregiver immediately if any bleeding, stomach upset, or signs of allergic reaction occur. Other minor pain relievers, such as acetaminophen, may also be used.  Pain relievers may be prescribed as necessary by your caregiver. Do not take prescription pain medication for longer than 4 to 7 days. Use only as directed and only as much as you need.  Cortisone injections are rarely indicated. Cortisone injections may weaken tendons and predispose to rupture. It is better to give the condition more time to heal than to use them. HEAT AND COLD  Cold is used to relieve pain and reduce inflammation for acute and chronic Achilles tendinitis. Cold should be applied for 10 to 15 minutes   every 2 to 3 hours for inflammation and pain and immediately after any activity that aggravates your symptoms. Use ice packs or an ice  massage.  Heat may be used before performing stretching and strengthening activities prescribed by your caregiver. Use a heat pack or a warm soak. SEEK MEDICAL CARE IF:  Symptoms get worse or do not improve in 2 weeks despite treatment.  New, unexplained symptoms develop. Drugs used in treatment may produce side effects.  EXERCISES:  RANGE OF MOTION (ROM) AND STRETCHING EXERCISES - Achilles Tendinitis  These exercises may help you when beginning to rehabilitate your injury. Your symptoms may resolve with or without further involvement from your physician, physical therapist or athletic trainer. While completing these exercises, remember:   Restoring tissue flexibility helps normal motion to return to the joints. This allows healthier, less painful movement and activity.  An effective stretch should be held for at least 30 seconds.  A stretch should never be painful. You should only feel a gentle lengthening or release in the stretched tissue.  STRETCH  Gastroc, Standing   Place hands on wall.  Extend right / left leg, keeping the front knee somewhat bent.  Slightly point your toes inward on your back foot.  Keeping your right / left heel on the floor and your knee straight, shift your weight toward the wall, not allowing your back to arch.  You should feel a gentle stretch in the right / left calf. Hold this position for 10 seconds. Repeat 3 times. Complete this stretch 2 times per day.  Plantar Fasciitis (Heel Spur Syndrome) with Rehab The plantar fascia is a fibrous, ligament-like, soft-tissue structure that spans the bottom of the foot. Plantar fasciitis is a condition that causes pain in the foot due to inflammation of the tissue. SYMPTOMS   Pain and tenderness on the underneath side of the foot.  Pain that worsens with standing or walking. CAUSES  Plantar fasciitis is caused by irritation and injury to the plantar fascia on the underneath side of the foot. Common  mechanisms of injury include:  Direct trauma to bottom of the foot.  Damage to a small nerve that runs under the foot where the main fascia attaches to the heel bone.  Stress placed on the plantar fascia due to bone spurs. RISK INCREASES WITH:   Activities that place stress on the plantar fascia (running, jumping, pivoting, or cutting).  Poor strength and flexibility.  Improperly fitted shoes.  Tight calf muscles.  Flat feet.  Failure to warm-up properly before activity.  Obesity. PREVENTION  Warm up and stretch properly before activity.  Allow for adequate recovery between workouts.  Maintain physical fitness:  Strength, flexibility, and endurance.  Cardiovascular fitness.  Maintain a health body weight.  Avoid stress on the plantar fascia.  Wear properly fitted shoes, including arch supports for individuals who have flat feet.  PROGNOSIS  If treated properly, then the symptoms of plantar fasciitis usually resolve without surgery. However, occasionally surgery is necessary.  RELATED COMPLICATIONS   Recurrent symptoms that may result in a chronic condition.  Problems of the lower back that are caused by compensating for the injury, such as limping.  Pain or weakness of the foot during push-off following surgery.  Chronic inflammation, scarring, and partial or complete fascia tear, occurring more often from repeated injections.  TREATMENT  Treatment initially involves the use of ice and medication to help reduce pain and inflammation. The use of strengthening and stretching exercises may help reduce  pain with activity, especially stretches of the Achilles tendon. These exercises may be performed at home or with a therapist. Your caregiver may recommend that you use heel cups of arch supports to help reduce stress on the plantar fascia. Occasionally, corticosteroid injections are given to reduce inflammation. If symptoms persist for greater than 6 months despite  non-surgical (conservative), then surgery may be recommended.   MEDICATION   If pain medication is necessary, then nonsteroidal anti-inflammatory medications, such as aspirin and ibuprofen, or other minor pain relievers, such as acetaminophen, are often recommended.  Do not take pain medication within 7 days before surgery.  Prescription pain relievers may be given if deemed necessary by your caregiver. Use only as directed and only as much as you need.  Corticosteroid injections may be given by your caregiver. These injections should be reserved for the most serious cases, because they may only be given a certain number of times.  HEAT AND COLD  Cold treatment (icing) relieves pain and reduces inflammation. Cold treatment should be applied for 10 to 15 minutes every 2 to 3 hours for inflammation and pain and immediately after any activity that aggravates your symptoms. Use ice packs or massage the area with a piece of ice (ice massage).  Heat treatment may be used prior to performing the stretching and strengthening activities prescribed by your caregiver, physical therapist, or athletic trainer. Use a heat pack or soak the injury in warm water.  SEEK IMMEDIATE MEDICAL CARE IF:  Treatment seems to offer no benefit, or the condition worsens.  Any medications produce adverse side effects.  EXERCISES- RANGE OF MOTION (ROM) AND STRETCHING EXERCISES - Plantar Fasciitis (Heel Spur Syndrome) These exercises may help you when beginning to rehabilitate your injury. Your symptoms may resolve with or without further involvement from your physician, physical therapist or athletic trainer. While completing these exercises, remember:   Restoring tissue flexibility helps normal motion to return to the joints. This allows healthier, less painful movement and activity.  An effective stretch should be held for at least 30 seconds.  A stretch should never be painful. You should only feel a gentle  lengthening or release in the stretched tissue.  RANGE OF MOTION - Toe Extension, Flexion  Sit with your right / left leg crossed over your opposite knee.  Grasp your toes and gently pull them back toward the top of your foot. You should feel a stretch on the bottom of your toes and/or foot.  Hold this stretch for 10 seconds.  Now, gently pull your toes toward the bottom of your foot. You should feel a stretch on the top of your toes and or foot.  Hold this stretch for 10 seconds. Repeat  times. Complete this stretch 3 times per day.   RANGE OF MOTION - Ankle Dorsiflexion, Active Assisted  Remove shoes and sit on a chair that is preferably not on a carpeted surface.  Place right / left foot under knee. Extend your opposite leg for support.  Keeping your heel down, slide your right / left foot back toward the chair until you feel a stretch at your ankle or calf. If you do not feel a stretch, slide your bottom forward to the edge of the chair, while still keeping your heel down.  Hold this stretch for 10 seconds. Repeat 3 times. Complete this stretch 2 times per day.   STRETCH  Gastroc, Standing  Place hands on wall.  Extend right / left leg, keeping the front knee  somewhat bent.  Slightly point your toes inward on your back foot.  Keeping your right / left heel on the floor and your knee straight, shift your weight toward the wall, not allowing your back to arch.  You should feel a gentle stretch in the right / left calf. Hold this position for 10 seconds. Repeat 3 times. Complete this stretch 2 times per day.  STRETCH  Soleus, Standing  Place hands on wall.  Extend right / left leg, keeping the other knee somewhat bent.  Slightly point your toes inward on your back foot.  Keep your right / left heel on the floor, bend your back knee, and slightly shift your weight over the back leg so that you feel a gentle stretch deep in your back calf.  Hold this position for 10  seconds. Repeat 3 times. Complete this stretch 2 times per day.  STRETCH  Gastrocsoleus, Standing  Note: This exercise can place a lot of stress on your foot and ankle. Please complete this exercise only if specifically instructed by your caregiver.   Place the ball of your right / left foot on a step, keeping your other foot firmly on the same step.  Hold on to the wall or a rail for balance.  Slowly lift your other foot, allowing your body weight to press your heel down over the edge of the step.  You should feel a stretch in your right / left calf.  Hold this position for 10 seconds.  Repeat this exercise with a slight bend in your right / left knee. Repeat 3 times. Complete this stretch 2 times per day.   STRENGTHENING EXERCISES - Plantar Fasciitis (Heel Spur Syndrome)  These exercises may help you when beginning to rehabilitate your injury. They may resolve your symptoms with or without further involvement from your physician, physical therapist or athletic trainer. While completing these exercises, remember:   Muscles can gain both the endurance and the strength needed for everyday activities through controlled exercises.  Complete these exercises as instructed by your physician, physical therapist or athletic trainer. Progress the resistance and repetitions only as guided.  STRENGTH - Towel Curls  Sit in a chair positioned on a non-carpeted surface.  Place your foot on a towel, keeping your heel on the floor.  Pull the towel toward your heel by only curling your toes. Keep your heel on the floor. Repeat 3 times. Complete this exercise 2 times per day.  STRENGTH - Ankle Inversion  Secure one end of a rubber exercise band/tubing to a fixed object (table, pole). Loop the other end around your foot just before your toes.  Place your fists between your knees. This will focus your strengthening at your ankle.  Slowly, pull your big toe up and in, making sure the band/tubing  is positioned to resist the entire motion.  Hold this position for 10 seconds.  Have your muscles resist the band/tubing as it slowly pulls your foot back to the starting position. Repeat 3 times. Complete this exercises 2 times per day.  Document Released: 10/17/2005 Document Revised: 01/09/2012 Document Reviewed: 01/29/2009 [facility] Patient Information 2014 [place], [place].

## 2015-11-13 NOTE — Progress Notes (Signed)
Phentermine rx. up front [facility]/fim 

## 2015-11-15 NOTE — Progress Notes (Signed)
Subjective:     Patient ID: Veronica Frazier, female   DOB: 21-Oct-1966, 50 y.o.   MRN: [id number]  HPI patient presents stating she's had 9 month history of pain in her heels which is getting worse and she had had 1 previous cortisone injection which only gave her temporary relief   Review of Systems  All other systems reviewed and are negative.      Objective:   Physical Exam  Constitutional: She is oriented to person, place, and time.  Cardiovascular: Intact distal pulses.   Musculoskeletal: Normal range of motion.  Neurological: She is oriented to person, place, and time.  Skin: Skin is warm.  Nursing note and vitals reviewed.  neurovascular status found to be intact with muscle strength adequate range of motion within normal limits with patient noted to have discomfort in the plantar heel region bilateral with fluid buildup noted around the medial band. Pain worse when getting up in the morning and after periods of sitting and patient is noted to have adequate perfusion and is well oriented 3     Assessment:     Inflammatory fasciitis chronic in nature heel region bilateral with acute manifestations    Plan:     H&P x-rays and condition reviewed with patient. Today I injected the medial band fascia bilateral 3 mg Kenalog 5 mg Xylocaine and went ahead and applied fascial brace bilateral and also night splint to try to help with nighttime pain for the left. Reappoint for [place] to recheck again in 3 weeks to reevaluate

## 2015-11-18 ENCOUNTER — Telehealth: Payer: Self-pay | Admitting: Neurology

## 2015-11-18 NOTE — Telephone Encounter (Signed)
We contacted ins to follow up.  Was routed to a VM for Rose.  Left message asking for return call.

## 2015-11-18 NOTE — Telephone Encounter (Signed)
Pt called said she rec'd letter from [employer] that  modafinil (PROVIGIL) 200 MG tablet has been denied. She wants to know will be the next step

## 2015-11-24 NOTE — Telephone Encounter (Signed)
Rose with [employer] called regarding PA. She is faxing a form to [employer]

## 2015-11-24 NOTE — Telephone Encounter (Signed)
I called back, was not able to reach [facility].  Will call again tomorrow.

## 2015-11-24 NOTE — Telephone Encounter (Signed)
Rose with [employer] [phone] called said RX has been denied for member. She needs additional information to send to medical director because member is appealing decision. This needs to be asap as there is a 48hr window for appeals.

## 2015-11-24 NOTE — Telephone Encounter (Signed)
I called back.  Got no answer.  Left message for Okey Dupre asking that she please call back with fax number they would like additional info sent to.  I have not yet received the form that was to be faxed today.

## 2015-11-24 NOTE — Telephone Encounter (Signed)
Veronica Frazier called said there is not a form. There is clinical questions she has to ask. She needs to actually speak with you. If this cannot happen it will be denied again. Please call her at [phone] until 3pm central time (that is 10 minutes) She said there is 45 hrs left on case. She will be back in office 8pm ct. - 3pm. Please call asap.

## 2015-11-25 NOTE — Telephone Encounter (Signed)
I called back again.  Spoke with Rose.  Verified requested info.  She has noted the file and they will contact us if anything further is needed.

## 2015-11-25 NOTE — Telephone Encounter (Signed)
Veronica Frazier called back today. She said if she knew a specific time you will be calling she would not be on the phone. She said there is 28 hrs left.

## 2015-11-25 NOTE — Telephone Encounter (Signed)
I called back again.  Got no answer.  Left message.  When Rose calls back. Could you please verify what specific info she is requesting?  Thank you!

## 2015-11-25 NOTE — Telephone Encounter (Signed)
Rose with [employer] is calling back. She states she needs to know how medication modafinil (PROVIGIL) 200 MG tablet has helped the patient, what other Rx's (names, dates and strengths)the patient has tried and failed and needs the dates when they were used beginning and end. She also needs to know why the patient is taking modafinil (PROVIGIL) 200 MG tablet.

## 2015-11-25 NOTE — Telephone Encounter (Signed)
Ins has approved the request for coverage effective until 10/30/2016.  They have notified patient of this decision as well.

## 2015-11-26 ENCOUNTER — Other Ambulatory Visit: Payer: Self-pay | Admitting: Neurology

## 2015-11-30 ENCOUNTER — Other Ambulatory Visit: Payer: Self-pay

## 2015-12-08 ENCOUNTER — Encounter: Payer: Self-pay | Admitting: *Deleted

## 2015-12-10 ENCOUNTER — Ambulatory Visit: Payer: PPO | Admitting: Podiatry

## 2015-12-14 ENCOUNTER — Encounter: Payer: Self-pay | Admitting: *Deleted

## 2015-12-15 ENCOUNTER — Telehealth: Payer: Self-pay | Admitting: Neurology

## 2015-12-15 NOTE — Telephone Encounter (Signed)
Pt is calling requesting IV steroid infusion tomorrow 12/16/15. She is having HA's x 3 days and fatigue x 2 days.

## 2015-12-15 NOTE — Telephone Encounter (Signed)
PC with [name].  She c/o fatigue, h/a over the last few days. Denies sx. of infection. Denies missed meds.  Per RAS, ok for SM 1gm IV, one dose.  Per [name], she can infuse at noon tomorrow.  Veronica Frazier is agreeable with this time/fim

## 2015-12-16 DIAGNOSIS — G35 Multiple sclerosis: Secondary | ICD-10-CM | POA: Diagnosis not present

## 2015-12-17 ENCOUNTER — Other Ambulatory Visit: Payer: Self-pay | Admitting: Neurology

## 2015-12-17 ENCOUNTER — Telehealth: Payer: Self-pay | Admitting: Neurology

## 2015-12-17 MED ORDER — MODAFINIL 200 MG PO TABS: ORAL_TABLET | ORAL | Status: DC

## 2015-12-17 MED ORDER — HYDROCODONE-ACETAMINOPHEN 5-325 MG PO TABS: 2.0000 | ORAL_TABLET | Freq: Four times a day (QID) | ORAL | Status: DC | PRN

## 2015-12-17 NOTE — Telephone Encounter (Signed)
Pt called requesting refill for HYDROcodone-acetaminophen (NORCO/VICODIN) 5-325 MG tablet . °

## 2015-12-17 NOTE — Telephone Encounter (Signed)
Modafinil rx. up front [facility]/fim 

## 2015-12-17 NOTE — Telephone Encounter (Signed)
Pt called sts she needs RX for modafinil (PROVIGIL) 200 MG tablet . She would like written RX and leave it at the front please. Pt will pick it up when she picks up hydrocodone.

## 2015-12-21 NOTE — Telephone Encounter (Signed)
PC with [name].  She sts. one dose of IV SM last week helped fatigue/h/a for about 24 hours.  Per RAS, ok for one more day of IV SM; if not better, will need ov.  Zariha is agreeable and per [name], I have given appt. for tomorrow 1430 for infusion/fim

## 2015-12-21 NOTE — Telephone Encounter (Addendum)
Pt called complaining she still has a headache and is extremely fatigued. She would like a call back to discuss options. She thinks her WBC is low. Please call and advise [phone]

## 2015-12-22 DIAGNOSIS — G35 Multiple sclerosis: Secondary | ICD-10-CM | POA: Diagnosis not present

## 2015-12-25 ENCOUNTER — Encounter: Payer: Self-pay | Admitting: *Deleted

## 2016-01-13 ENCOUNTER — Ambulatory Visit: Payer: PPO | Admitting: Neurology

## 2016-01-15 ENCOUNTER — Encounter: Payer: Self-pay | Admitting: Neurology

## 2016-01-15 ENCOUNTER — Ambulatory Visit (INDEPENDENT_AMBULATORY_CARE_PROVIDER_SITE_OTHER): Payer: PPO | Admitting: Rehabilitative and Restorative Service Providers"

## 2016-01-15 ENCOUNTER — Encounter: Payer: Self-pay | Admitting: Rehabilitative and Restorative Service Providers"

## 2016-01-15 DIAGNOSIS — M25612 Stiffness of left shoulder, not elsewhere classified: Secondary | ICD-10-CM

## 2016-01-15 DIAGNOSIS — Z7409 Other reduced mobility: Secondary | ICD-10-CM | POA: Diagnosis not present

## 2016-01-15 DIAGNOSIS — M259 Joint disorder, unspecified: Secondary | ICD-10-CM | POA: Diagnosis not present

## 2016-01-15 DIAGNOSIS — R531 Weakness: Secondary | ICD-10-CM

## 2016-01-15 DIAGNOSIS — R293 Abnormal posture: Secondary | ICD-10-CM | POA: Diagnosis not present

## 2016-01-15 NOTE — Addendum Note (Signed)
Addended by: Val Riles on: 01/15/2016 03:16 PM   Modules accepted: Medications

## 2016-01-15 NOTE — Therapy (Addendum)
[facility] [place], [place], [address] Phone: [phone]   Fax:  [phone]  Physical Therapy Evaluation  Patient Details  Name: Veronica Frazier MRN: [id number] Date of Birth: [birth year] Referring Provider: Dr. Alphonzo Severance   Encounter Date: 01/15/2016      PT End of Session - 01/15/16 1346    Visit Number 1   Number of Visits 12   Date for PT Re-Evaluation 02/26/16   PT Start Time 1346   PT Stop Time 1449   PT Time Calculation (min) 63 min   Activity Tolerance Patient tolerated treatment well      Past Medical History  Diagnosis Date  . Multiple sclerosis ([place])   . Hypertension   . Vision abnormalities   . Neuropathy ([place])   . Stroke [facility])     Past Surgical History  Procedure Laterality Date  . Abdominal hysterectomy    . Cholecystectomy    . Appendectomy    . Tonsillectomy    . Breast surgery      There were no vitals filed for this visit.  Visit Diagnosis:  Shoulder joint dysfunction - Plan: PT plan of care cert/re-cert  Stiffness of left shoulder joint - Plan: PT plan of care cert/re-cert  Abnormal posture - Plan: PT plan of care cert/re-cert  Decreased strength, endurance, and mobility - Plan: PT plan of care cert/re-cert      Subjective Assessment - 01/15/16 1350    Subjective Veronica Frazier reports that she fell over an elevated object in the floor sustaining fx of Lt shoulder. She was placed in a sling for ~3 months. She continues to have swelling and pain in the shoulder area as well as limited movement and function. She has greatest difficulty reaching behind her or reaching up.    Pertinent History MS controlled with medication (17 yrs)   How long can you sit comfortably? no limit   How long can you stand comfortably? no limit   How long can you walk comfortably? no limit   Diagnostic tests xays - nondisplaced Fx Lt greater tuberosity 09/17/15   Patient Stated Goals so I can reach  above her head and behind her without pain    Currently in Pain? Yes   Pain Score 2    Pain Location Shoulder   Pain Orientation Left   Pain Descriptors / Indicators Sore;Aching   Pain Type Acute pain   Pain Radiating Towards down to mid arm    Pain Onset More than a month ago   Pain Frequency Intermittent   Aggravating Factors  reaching up or back; lying on the Lt side    Pain Relieving Factors ice; heat; hot tub; meds    Multiple Pain Sites Yes            [facility] PT Assessment - 01/15/16 0001    Assessment   Medical Diagnosis non-displaced Fx Lt greater tuberosity - shoulder dysfunction    Referring Provider Dr. Alphonzo Severance    Onset Date/Surgical Date 09/17/15   Hand Dominance Right   Next MD Visit as needed    Prior Therapy none   Precautions   Precautions None   Balance Screen   Has the patient fallen in the past 6 months Yes   How many times? 1   Has the patient had a decrease in activity level because of a fear of falling?  No   Is the patient reluctant to leave their home because of a  fear of falling?  No   Home Environment   Additional Comments single level home    Prior Function   Level of Independence Independent   Vocation On disability   Leisure household chores caring for 65 yr old; 3 grand children; mom down the street    Observation/Other Assessments   Focus on Therapeutic Outcomes (FOTO)  44% limitation    Sensation   Additional Comments some intermittent numbness related to MS    Posture/Postural Control   Posture Comments head forward shoulders rounded and elevated; head of the humerus anterior in orientation; scapulae abducted and rotated along the thoracic wall    AROM   Right Shoulder Extension 59 Degrees   Right Shoulder Flexion 157 Degrees   Right Shoulder ABduction 155 Degrees   Right Shoulder Internal Rotation 30 Degrees   Right Shoulder External Rotation 90 Degrees   Left Shoulder Extension 43 Degrees   Left Shoulder Flexion 133 Degrees    Left Shoulder ABduction 134 Degrees  pain   Left Shoulder Internal Rotation 27 Degrees  painful    Left Shoulder External Rotation 84 Degrees  painful    Cervical Flexion 48   Cervical Extension 35   Cervical - Right Side Bend 43   Cervical - Left Side Bend 42   Cervical - Right Rotation 71   Cervical - Left Rotation 77   Strength   Overall Strength Comments 4+/4 to 5/5 throughout bilat UE's (decreased generally d/t MS) except Lt shd flex; abd; ER 4/5    Palpation   Palpation comment muscular tenderness and tightness Lt pecs; upper trap; leveator; teres; insertion of deltoid                    [facility] Adult PT Treatment/Exercise - 01/15/16 0001    Therapeutic Activites    Therapeutic Activities --  myofacial ball release work    Neuro Re-ed    Neuro Re-ed Details  working on posture and alignment    Shoulder Exercises: Standing   Other Standing Exercises scap squeeze with noodle 10 sec x 10    Shoulder Exercises: Pulleys   Flexion --  10 sec x 5   ABduction --  10 sec x 6   Shoulder Exercises: Stretch   Corner Stretch --  3 way doorway - 30 sec x 3/min stretch no step in    External Rotation Stretch 5 reps;10 seconds  with cane and noodle    Table Stretch - Flexion 5 reps;10 seconds   Other Shoulder Stretches shd extension 10 sec x 5 with cane behind back    Cryotherapy   Number Minutes Cryotherapy 15 Minutes   Cryotherapy Location Shoulder   Type of Cryotherapy Ice pack   Electrical Stimulation   Electrical Stimulation Location Lt shoulder    Electrical Stimulation Action IFC   Electrical Stimulation Parameters to tolerance   Electrical Stimulation Goals Pain;Tone                PT Education - 01/15/16 1444    Education provided Yes   Education Details posture and alignment; HEP    Person(s) Educated Patient   Methods Explanation;Demonstration;Tactile cues;Verbal cues;Handout   Comprehension Verbalized understanding;Returned  demonstration;Verbal cues required;Tactile cues required             PT Long Term Goals - 01/15/16 1503    PT LONG TERM GOAL #1   Title Improve posture and alignment with pt to demonstrate upright posture with improved scapular position  for functional activity for UE's 02/26/16   Time 6   Period Weeks   Status New   PT LONG TERM GOAL #2   Title Increase AROM Lt shoulder to equal or greater than AROM Rt shoulder 02/26/16   Time 6   Period Weeks   Status New   PT LONG TERM GOAL #3   Title Improve strength Lt UE to allow pt to perform normal functional activities without pain 02/26/16   Time 6   Period Weeks   Status New   PT LONG TERM GOAL #4   Title Patient reports being able to lift arm up and back for functional activities withtout pain 02/26/16   Time 6   Period Weeks   Status New   PT LONG TERM GOAL #5   Title I in HEP 02/25/26   Time 6   Period Weeks   Status New   PT LONG TERM GOAL #6   Title Improve FOTO to </=32% limitation 02/26/16   Time 6   Period Weeks   Status New               Plan - 01/15/16 1452    Clinical Impression Statement Veronica Frazier presents s/p nondisplaced greater tuberosity fx which was immobilized for ~3 months with sling. Pt now has abnormal posture and alignment; limited ROM; strength and function in Lt shoudler. She has muscular tightness to palpation and pain with functional  activity reaching overhead and behind her back. She will benefit from PT to address problems identified.    Pt will benefit from skilled therapeutic intervention in order to improve on the following deficits Postural dysfunction;Improper body mechanics;Pain;Decreased range of motion;Decreased strength;Decreased mobility;Impaired UE functional use;Decreased endurance;Decreased activity tolerance   Rehab Potential Good   PT Frequency 2x / week   PT Duration 6 weeks   PT Treatment/Interventions Patient/family education;ADLs/Self Care Home Management;Therapeutic  exercise;Therapeutic activities;Manual techniques;Dry needling;Cryotherapy;Electrical Stimulation;Iontophoresis 63m/ml Dexamethasone;Moist Heat;Ultrasound;Neuromuscular re-education   PT Next Visit Plan stretch pecs; shoulder in all planes. posterior shoulder girdle strengthening as tolerated; manual work including PROM for [employer] modalities as indicated   PT Home Exercise Plan postural work; HFurniture conservator/restorerwith Plan of Care Patient         Problem List Patient Active Problem List   Diagnosis Date Noted  . Gait disturbance 10/13/2015  . Disturbed cognition 04/09/2015  . Right arm pain 04/09/2015  . Multiple sclerosis, relapsing-remitting ([place] 12/09/2014  . Chronic fatigue 12/09/2014  . Depression 12/09/2014  . Insomnia 12/09/2014  . Visual disturbance 12/09/2014  . CFIDS (chronic fatigue and immune dysfunction syndrome) 12/09/2014  . Essential (primary) hypertension 09/03/2014  . HLD (hyperlipidemia) 04/21/2014  . Encounter for general adult medical examination without abnormal findings 04/18/2014  . Multiple sclerosis ([place] 04/06/2013    Neale Marzette PNilda SimmerPT, MPH  01/15/2016, 3:16 PM  [facility] [address] [address]Phone: [phone]  Fax:  [phone] Name: CGRAVIELA NODALMRN: [id number] of Birth: [birth year]  PHYSICAL THERAPY DISCHARGE SUMMARY  Visits from Start of Care: Eval only  Current functional level related to goals / functional outcomes: unchanged   Remaining deficits: unchanged   Education / Equipment: Initial HEP Plan: Patient agrees to discharge.  Patient goals were not met. Patient is being discharged due to not returning since the last visit.  ?????   Lesa Vandall P. HHelene KelpPT, MPH 04/06/2016 12:27 PM

## 2016-01-15 NOTE — Patient Instructions (Signed)
Self massage using ~4 inch rubber ball   Shoulder Blade Squeeze    Rotate shoulders back, then squeeze shoulder blades down and back. Hold 10 sec Repeat _10___ times. Do __several __ sessions per day.  Scapula Adduction With Pectoralis Stretch: Low - Standing   Shoulders at 45 hands even with shoulders, keeping weight through legs, shift weight forward until you feel pull or stretch through the front of your chest. Hold _30__ seconds. Do _3__ times, _2-4__ times per day.   Scapula Adduction With Pectoralis Stretch: Mid-Range - Standing   Shoulders at 90 elbows even with shoulders, keeping weight through legs, shift weight forward until you feel pull or strength through the front of your chest. Hold __30_ seconds. Do _3__ times, __2-4_ times per day.   Scapula Adduction With Pectoralis Stretch: High - Standing   Shoulders at 120 hands up high on the doorway, keeping weight on feet, shift weight forward until you feel pull or stretch through the front of your chest. Hold _30__ seconds. Do _3__ times, _2-3__ times per day.    External Rotator Cuff Stretch, Supine (Passive) Do this in standing with noodle at back    Lie supine, one elbow against ribs, and bent at 90, dowel in palm, other hand holding dowel up. Use other arm to push forearm toward floor. Keep elbow against side. Hold ___ seconds. Repeat ___ times per session. Do ___ sessions per day.    Anterior Capsule Stretch, Standing    Stand holding stick behind back, hands palms up. Lift stick away from body as far as possible. Hold _10__ seconds. Repeat __5-10_ times per session. Do _2-3__ sessions per day.     Scapular Retractor Stretch    Place left arm palm down on table. Slide hand forward without leaning into the stretch. Hold __10_ seconds.  Repeat _5-10__ times per session. Do _2-3__ sessions per day.   TENS UNIT: This is helpful for muscle pain and spasm.   Search and Purchase a TENS 7000 2nd  edition at www.tenspros.com. It should be less than $30.     TENS unit instructions: Do not shower or bathe with the unit on Turn the unit off before removing electrodes or batteries If the electrodes lose stickiness add a drop of water to the electrodes after they are disconnected from the unit and place on plastic sheet. If you continued to have difficulty, call the TENS unit company to purchase more electrodes. Do not apply lotion on the skin area prior to use. Make sure the skin is clean and dry as this will help prolong the life of the electrodes. After use, always check skin for unusual red areas, rash or other skin difficulties. If there are any skin problems, does not apply electrodes to the same area. Never remove the electrodes from the unit by pulling the wires. Do not use the TENS unit or electrodes other than as directed. Do not change electrode placement without consultating your therapist or physician. Keep 2 fingers with between each electrode.

## 2016-01-19 ENCOUNTER — Encounter: Payer: Medicare Other | Admitting: Rehabilitative and Restorative Service Providers"

## 2016-01-20 ENCOUNTER — Other Ambulatory Visit: Payer: Self-pay | Admitting: Neurology

## 2016-01-20 NOTE — Telephone Encounter (Signed)
Patient called to request refill of HYDROcodone-acetaminophen (NORCO/VICODIN) 5-325 MG tablet °

## 2016-01-20 NOTE — Telephone Encounter (Signed)
Patient also requests refill of cyclobenzaprine (FLEXERIL) 10 MG tablet

## 2016-01-21 ENCOUNTER — Encounter: Payer: Medicare Other | Admitting: Physical Therapy

## 2016-01-21 MED ORDER — MELOXICAM 15 MG PO TABS: 15.0000 mg | ORAL_TABLET | Freq: Every day | ORAL | Status: DC

## 2016-01-21 MED ORDER — HYDROCODONE-ACETAMINOPHEN 5-325 MG PO TABS: 2.0000 | ORAL_TABLET | Freq: Four times a day (QID) | ORAL | Status: DC | PRN

## 2016-01-21 NOTE — Telephone Encounter (Signed)
Hydrocodone rx. awaiting RAS sig.  Last Flexeril rx. was given 04-09-15, had one yr. r/f, so she should have r/f thru June 2017.  I attempted to contact Veronica Frazier to let her know this, but vm has not been set up/fim

## 2016-01-21 NOTE — Telephone Encounter (Signed)
Hydrocodone rx. up front [facility]/fim 

## 2016-01-28 ENCOUNTER — Encounter: Payer: PPO | Admitting: Physical Therapy

## 2016-01-29 ENCOUNTER — Encounter: Payer: PPO | Admitting: Physical Therapy

## 2016-01-29 NOTE — Progress Notes (Signed)
This encounter was created in error - please disregard.

## 2016-02-03 ENCOUNTER — Telehealth: Payer: Self-pay | Admitting: Neurology

## 2016-02-03 MED ORDER — HYDROCODONE-ACETAMINOPHEN 5-325 MG PO TABS: 2.0000 | ORAL_TABLET | Freq: Four times a day (QID) | ORAL | Status: DC | PRN

## 2016-02-03 NOTE — Telephone Encounter (Signed)
Patient called to request refills of HYDROcodone-acetaminophen (NORCO/VICODIN) 5-325 MG tablet and cyclobenzaprine (FLEXERIL) 10 MG tablet

## 2016-02-03 NOTE — Telephone Encounter (Signed)
I have spoken with Trichelle and Lowella Grip that: 1--she should have r/f of Flexeril thru the first of June (on 04-09-15, one yr. of r/f was sent in to [facility].)  2--Hydrocodone was r/f 01-21-16.  She sts she has not yet picked this rx. up.  I have checked up front--it was logged in on 01-21-16, but not signed out.  I have checked the entirety of the rx .drawer and am not able to locate rx. Per RAS, ok to reprint rx.  Rx. was reprinted, taken to the front to log in, and apparently  just before I was looking for rx. that was printed on 3-23, pt. arrived.  Gerri gave her the rx., but didn't see where it was logged in on 3-23, so she re-logged rx. in today and pt. signed under today's date.  Hydrocodone rx. printed today has been shredded.  RAS aware./fim

## 2016-02-09 ENCOUNTER — Ambulatory Visit: Payer: Medicare Other | Admitting: Neurology

## 2016-02-16 ENCOUNTER — Encounter: Payer: Self-pay | Admitting: Neurology

## 2016-02-16 ENCOUNTER — Ambulatory Visit (INDEPENDENT_AMBULATORY_CARE_PROVIDER_SITE_OTHER): Payer: PPO | Admitting: Neurology

## 2016-02-16 VITALS — BP 146/90 | HR 76 | Resp 14 | Ht 67.0 in | Wt 207.5 lb

## 2016-02-16 DIAGNOSIS — H539 Unspecified visual disturbance: Secondary | ICD-10-CM

## 2016-02-16 DIAGNOSIS — R5382 Chronic fatigue, unspecified: Secondary | ICD-10-CM

## 2016-02-16 DIAGNOSIS — G4700 Insomnia, unspecified: Secondary | ICD-10-CM

## 2016-02-16 DIAGNOSIS — G35 Multiple sclerosis: Secondary | ICD-10-CM | POA: Diagnosis not present

## 2016-02-16 DIAGNOSIS — R269 Unspecified abnormalities of gait and mobility: Secondary | ICD-10-CM

## 2016-02-16 MED ORDER — LIDOCAINE 5 % EX PTCH: 1.0000 | MEDICATED_PATCH | CUTANEOUS | Status: DC

## 2016-02-16 MED ORDER — LORAZEPAM 1 MG PO TABS: 1.0000 mg | ORAL_TABLET | Freq: Every day | ORAL | Status: DC

## 2016-02-16 NOTE — Progress Notes (Signed)
[facility]  PATIENT: Veronica Frazier DOB: 24-May-1966  REFERRING CLINICIAN: Harlene Ramus HISTORY FROM: patient REASON FOR VISIT: MS   HISTORICAL  CHIEF COMPLAINT:  Chief Complaint  Patient presents with  . Multiple Sclerosis    Sts. she continues to tolerate Tecfidera well.  Sts. is having more left shoulder pain, esp. with lyimg on that side at night.  She would like rx. for Lidocaine patches--has had these in the psat.  She also needs r/f of Ativan/fim    HISTORY OF PRESENT ILLNESS:  Veronica Frazier is a 50 year old woman with MS and related symptoms.   She is currently on Tecfidera and tolerates it well.   She has not had any recent side effects on it.     MS History:   She presented in 2000  with pain and tingling in the left arm. At that time, she had an MRI of the brain performed that was concerning for multiple sclerosis. Additionally, she had a lumbar puncture performed with CSF that was consistent with MS. Initially, she was placed on Avonex injections weekly. Initially, she did well with the Avonex but then had an MRI that showed some progression. About 3 years ago she switched to Tecfidera. She tolerates Tecfidera fairly well, having just a little bit of flushing  Gait/strength/sensation:   Her balance is poor but no recent fsall since fall that broke shoulder.    She tires out quickly with walking.   She sometimes hits the wall or doorframe while walking and tries to be careful.         She has mild right arm dysesthesia   Vision:   She continues to note bilateral visual blurring. Vision seems wore to left.   Depth perception is often off.   Vision is worse if she has a headache.    Bladder:  She denies much difficulties with her bladder. Colitiis doing better.  Mood/ Cognition:  She feels mood is better on duloxetine and fluoxetine.   Anxiety is mild.    She has cognitive difficulties, especially with short-term memory and executive function.    Verbal fluency seems better recently.    Fatigue/sleep:  She has difficulty with fatigue but feels this was worse a couple years ago..  She has some daytime sleepiness. Adding phentermine helped her fatigue some and she remains on Provigil.   Sleep is better than last visit since starting night time flexeril and lorazepam..   She snores but has not had any gasping at night or pauses in her breathing.  Insomnia is more sleep maintenance than sleep onset.      REVIEW OF SYSTEMS:  Constitutional: No fevers, chills, sweats, or change in appetite   She has fatigue Eyes: Blurry vision and rare diplopia.  No eye pain Ear, nose and throat: No hearing loss, ear pain, nasal congestion, sore throat Cardiovascular: No chest pain, palpitations Respiratory:  No shortness of breath at rest or with exertion.   No wheezes   She snores GastrointestinaI: No nausea but recent diarrhea and constipation.    Colitis being treated Genitourinary:  No dysuria, urinary retention or frequency.  No nocturia. Musculoskeletal:  No neck pain.  Mild axial back pain.   Right arm pain Integumentary: No rash, pruritus, skin lesions Neurological: as above Psychiatric:  She reports more Depression.  No anxiety Endocrine: No palpitations, diaphoresis.    Increased hunger and  increased thirst Hematologic/Lymphatic:  No anemia, purpura, petechiae. Allergic/Immunologic: No itchy/runny eyes, nasal congestion, recent  allergic reactions, rashes  ALLERGIES: No Known Allergies  HOME MEDICATIONS: Outpatient Prescriptions Prior to Visit  Medication Sig Dispense Refill  . aspirin-acetaminophen-caffeine (EXCEDRIN MIGRAINE) 250-250-65 MG per tablet Take 1 tablet by mouth 3 (three) times daily as needed.      . cyclobenzaprine (FLEXERIL) 10 MG tablet Take 1 tablet (10 mg total) by mouth at bedtime. 90 tablet 3  . Dimethyl Fumarate 240 MG CPDR Take 1 capsule (240 mg total) by mouth 2 (two) times daily at 10 AM and 5 PM. 180 capsule 3  .  DULoxetine (CYMBALTA) 60 MG capsule Take 1 capsule (60 mg total) by mouth daily. 90 capsule 1  . FLUoxetine (PROZAC) 40 MG capsule Take 1 capsule (40 mg total) by mouth daily. 90 capsule 3  . gabapentin (NEURONTIN) 300 MG capsule TAKE ONE CAPSULE BY MOUTH 4 TIMES DAILY 360 capsule 0  . HYDROcodone-acetaminophen (NORCO/VICODIN) 5-325 MG tablet Take 2 tablets by mouth every 6 (six) hours as needed. 120 tablet 0  . ibuprofen (ADVIL,MOTRIN) 800 MG tablet Take 800 mg by mouth once as needed. Reported on 01/15/2016    . LOSARTAN POTASSIUM PO Take by mouth.    . meloxicam (MOBIC) 15 MG tablet Take 1 tablet (15 mg total) by mouth daily. 90 tablet 3  . modafinil (PROVIGIL) 200 MG tablet Take one-half tablet 3 times daily as needed. 135 tablet 5  . Multiple Vitamins-Calcium (ONE-A-DAY WOMENS FORMULA PO) Take 1 tablet by mouth daily.      Marland Kitchen omeprazole (PRILOSEC) 20 MG capsule     . phentermine 37.5 MG capsule Take 1 capsule (37.5 mg total) by mouth every morning. 90 capsule 1  . lidocaine (LIDODERM) 5 % Place 1 patch onto the skin daily. Reported on 02/16/2016    . LORazepam (ATIVAN) 1 MG tablet Take 1 tablet (1 mg total) by mouth at bedtime. (Patient not taking: Reported on 02/16/2016) 30 tablet 0  . acetaminophen-codeine (TYLENOL #3) 300-30 MG tablet Take by mouth at bedtime. Reported on 01/15/2016    . benzonatate (TESSALON) 100 MG capsule Take 1 capsule (100 mg total) by mouth every 8 (eight) hours. (Patient not taking: Reported on 01/15/2016) 21 capsule 0  . ondansetron (ZOFRAN) 4 MG tablet Take 1 tablet (4 mg total) by mouth every 6 (six) hours. (Patient not taking: Reported on 01/15/2016) 12 tablet 0   No facility-administered medications prior to visit.    PAST MEDICAL HISTORY: Past Medical History  Diagnosis Date  . Multiple sclerosis ([facility])   . Hypertension   . Vision abnormalities   . Neuropathy ([facility])   . Stroke [facility])     PAST SURGICAL HISTORY: Past Surgical History  Procedure Laterality  Date  . Abdominal hysterectomy    . Cholecystectomy    . Appendectomy    . Tonsillectomy    . Breast surgery      FAMILY HISTORY: Family History  Problem Relation Age of Onset  . Hypertension Mother   . Hyperlipidemia Mother   . Colitis Mother   . Hypertension Father     SOCIAL HISTORY:  Social History   Social History  . Marital Status: Married    Spouse Name: N/A  . Number of Children: N/A  . Years of Education: N/A   Occupational History  . Not on file.   Social History Main Topics  . Smoking status: Former Games developer  . Smokeless tobacco: Not on file  . Alcohol Use: No  . Drug Use: No  . Sexual Activity: Yes  Birth Control/ Protection: Surgical   Other Topics Concern  . Not on file   Social History Narrative     PHYSICAL EXAM  Filed Vitals:   02/16/16 1513  BP: 146/90  Pulse: 76  Resp: 14  Height: 5\' 7"  (1.702 m)  Weight: 207 lb 8 oz (94.121 kg)    Body mass index is 32.49 kg/(m^2).   General: The patient is well-developed and well-nourished and in no acute distress   Skin: Extremities are without significant edema.   Neurologic Exam  Mental status: The patient is alert and oriented x 3 at the time of the examination. The patient has apparent normal recent and remote memory, with an apparently normal attention span and concentration ability.   Speech is normal.  Cranial nerves: Extraocular movements are full.   There is good facial sensation to soft touch bilaterally.Facial strength is normal.  Trapezius and sternocleidomastoid strength is normal. No dysarthria is noted.  The tongue is midline, and the patient has symmetric elevation of the soft palate. No obvious hearing deficits are noted.  Motor:  Muscle bulk and tone are normal. Strength is  5 / 5 in all 4 extremities.   Sensory: Sensory testing is intact to touch in all 4 extremities.  Coordination: Cerebellar testing reveals good finger-nose-finger and heel-to-shin bilaterally.  Gait  and station: Station and gait are fairly normal. Tandem gait is wide. Romberg is negative.   Reflexes: Deep tendon reflexes are symmetric and normal bilaterally.     DIAGNOSTIC DATA (LABS, IMAGING, TESTING) - I reviewed patient records, labs, notes, testing and imaging myself where available.  Lab Results  Component Value Date   WBC 4.0 10/13/2015   HGB 13.4 12/09/2014   HCT 39.2 10/13/2015   MCV 96 10/13/2015   PLT 229 10/13/2015      Component Value Date/Time   NA 133* 11/19/2014 0705   K 3.3* 11/19/2014 0705   CL 98 11/19/2014 0705   CO2 25 11/19/2014 0705   GLUCOSE 179* 11/19/2014 0705   BUN 9 11/19/2014 0705   CREATININE 0.69 11/19/2014 0705   CALCIUM 8.3* 11/19/2014 0705   PROT 6.9 06/13/2011 0306   ALBUMIN 3.6 06/13/2011 0306   AST 56* 06/13/2011 0306   ALT 58* 06/13/2011 0306   ALKPHOS 88 06/13/2011 0306   BILITOT 0.5 06/13/2011 0306   GFRNONAA >90 11/19/2014 0705   GFRAA >90 11/19/2014 0705       ASSESSMENT AND PLAN  Multiple sclerosis ([facility])  Chronic fatigue  Gait disturbance  Visual disturbance  Insomnia    1.    She will continue Tecfidera for now though we discussed ocrelizumab if she may be interested in switching once more people have been on the medication..  Check CBC with diff today.    2.  Continue Provigil, phentermine, hydrocodone and cyclobenzaprine 3.   Advised to try to stay active and exercises tolerated. 4.   She will return to see me in 4 months or sooner if she has new or worsening symptoms.   Laurence Folz A. Epimenio Foot, MD, PhD 02/16/2016, 3:21 PM Certified in Neurology, Clinical Neurophysiology, Sleep Medicine, Pain Medicine and Neuroimaging  [facility] [address] [place], [place] [address] [phone]

## 2016-02-17 ENCOUNTER — Telehealth: Payer: Self-pay | Admitting: *Deleted

## 2016-02-17 LAB — CBC WITH DIFFERENTIAL/PLATELET
BASOS ABS: 0.1 10*3/uL (ref 0.0–0.2)
Basos: 1 %
EOS (ABSOLUTE): 0.2 10*3/uL (ref 0.0–0.4)
Eos: 3 %
HEMOGLOBIN: 13.7 g/dL (ref 11.1–15.9)
Hematocrit: 41.6 % (ref 34.0–46.6)
Immature Grans (Abs): 0.1 10*3/uL (ref 0.0–0.1)
Immature Granulocytes: 1 %
LYMPHS ABS: 1.1 10*3/uL (ref 0.7–3.1)
Lymphs: 17 %
MCH: 31.3 pg (ref 26.6–33.0)
MCHC: 32.9 g/dL (ref 31.5–35.7)
MCV: 95 fL (ref 79–97)
MONOCYTES: 10 %
MONOS ABS: 0.6 10*3/uL (ref 0.1–0.9)
NEUTROS ABS: 4.1 10*3/uL (ref 1.4–7.0)
Neutrophils: 68 %
Platelets: 232 10*3/uL (ref 150–379)
RBC: 4.38 x10E6/uL (ref 3.77–5.28)
RDW: 14.8 % (ref 12.3–15.4)
WBC: 6.1 10*3/uL (ref 3.4–10.8)

## 2016-02-17 NOTE — Telephone Encounter (Signed)
I have spoken with Veronica Frazier this afternoon and per RAS, advised that labwork done in our office was fine.  She verbalized understanding of same/fim

## 2016-02-17 NOTE — Telephone Encounter (Signed)
-----   Message from Asa Lente, MD sent at 02/17/2016  8:39 AM EDT ----- Labs are fine.

## 2016-02-25 DIAGNOSIS — R112 Nausea with vomiting, unspecified: Secondary | ICD-10-CM | POA: Diagnosis not present

## 2016-02-25 DIAGNOSIS — A09 Infectious gastroenteritis and colitis, unspecified: Secondary | ICD-10-CM | POA: Diagnosis not present

## 2016-02-25 DIAGNOSIS — M545 Low back pain: Secondary | ICD-10-CM | POA: Diagnosis not present

## 2016-02-25 DIAGNOSIS — Z6833 Body mass index (BMI) 33.0-33.9, adult: Secondary | ICD-10-CM | POA: Diagnosis not present

## 2016-02-26 DIAGNOSIS — M545 Low back pain: Secondary | ICD-10-CM | POA: Diagnosis not present

## 2016-03-04 ENCOUNTER — Telehealth: Payer: Self-pay | Admitting: Neurology

## 2016-03-04 MED ORDER — HYDROCODONE-ACETAMINOPHEN 5-325 MG PO TABS: 1.0000 | ORAL_TABLET | Freq: Four times a day (QID) | ORAL | Status: DC | PRN

## 2016-03-04 NOTE — Telephone Encounter (Signed)
Rx. awaiting RAS sig/fim 

## 2016-03-04 NOTE — Telephone Encounter (Signed)
Patient requesting written Rx for HYDROcodone-acetaminophen (NORCO/VICODIN) 5-325 MG tablet. I advised the Rx will be ready in 24 hours (Monday) unless the nurse advises otherwise.

## 2016-03-04 NOTE — Telephone Encounter (Signed)
Hydrocodone rx. up front [facility]/fim 

## 2016-03-14 ENCOUNTER — Other Ambulatory Visit: Payer: Self-pay | Admitting: Neurology

## 2016-03-23 ENCOUNTER — Encounter: Payer: Self-pay | Admitting: *Deleted

## 2016-03-29 DIAGNOSIS — J069 Acute upper respiratory infection, unspecified: Secondary | ICD-10-CM | POA: Diagnosis not present

## 2016-03-29 DIAGNOSIS — Z6833 Body mass index (BMI) 33.0-33.9, adult: Secondary | ICD-10-CM | POA: Diagnosis not present

## 2016-04-04 DIAGNOSIS — Z6833 Body mass index (BMI) 33.0-33.9, adult: Secondary | ICD-10-CM | POA: Diagnosis not present

## 2016-04-04 DIAGNOSIS — J40 Bronchitis, not specified as acute or chronic: Secondary | ICD-10-CM | POA: Diagnosis not present

## 2016-04-07 ENCOUNTER — Telehealth: Payer: Self-pay | Admitting: Neurology

## 2016-04-07 MED ORDER — HYDROCODONE-ACETAMINOPHEN 5-325 MG PO TABS: 1.0000 | ORAL_TABLET | Freq: Four times a day (QID) | ORAL | Status: DC | PRN

## 2016-04-07 NOTE — Telephone Encounter (Signed)
Patient requesting refill of HYDROcodone-acetaminophen (NORCO/VICODIN) 5-325 MG tablet  °Pharmacy: pick up ° °

## 2016-04-07 NOTE — Telephone Encounter (Signed)
Rx. awaiting RAS sig/fim 

## 2016-04-07 NOTE — Telephone Encounter (Signed)
Hydrocodone rx. up front [facility]/fim 

## 2016-04-27 ENCOUNTER — Encounter: Payer: Self-pay | Admitting: *Deleted

## 2016-05-09 ENCOUNTER — Telehealth: Payer: Self-pay | Admitting: Neurology

## 2016-05-09 MED ORDER — HYDROCODONE-ACETAMINOPHEN 5-325 MG PO TABS: 1.0000 | ORAL_TABLET | Freq: Four times a day (QID) | ORAL | Status: DC | PRN

## 2016-05-09 NOTE — Telephone Encounter (Signed)
Rx. awaiting RAS sig/fim 

## 2016-05-09 NOTE — Telephone Encounter (Signed)
Patient called to request refill of HYDROcodone-acetaminophen (NORCO/VICODIN) 5-325 MG tablet °

## 2016-05-10 NOTE — Telephone Encounter (Signed)
Hydrocodone rx. up front [facility]/fim 

## 2016-05-17 ENCOUNTER — Telehealth: Payer: Self-pay | Admitting: Neurology

## 2016-05-17 NOTE — Telephone Encounter (Signed)
Patient is calling and states that her lips have turned white and slightly numb and she is extremely tired and can't seem to stay awake. Please call.

## 2016-05-18 NOTE — Telephone Encounter (Signed)
VM full/fim 

## 2016-05-24 ENCOUNTER — Telehealth: Payer: Self-pay | Admitting: Neurology

## 2016-05-24 MED ORDER — CYCLOBENZAPRINE HCL 10 MG PO TABS: 10.0000 mg | ORAL_TABLET | Freq: Every day | ORAL | 3 refills | Status: DC

## 2016-05-24 NOTE — Telephone Encounter (Signed)
Patient is calling to order Rx cyclobenzaprine 10 mg.  Please change pharmacy to [facility], [place], [place].  Thanks!

## 2016-05-24 NOTE — Telephone Encounter (Signed)
Flexeril escribed to [facility], as requested/fim

## 2016-06-16 ENCOUNTER — Other Ambulatory Visit: Payer: Self-pay | Admitting: Neurology

## 2016-06-19 ENCOUNTER — Other Ambulatory Visit: Payer: Self-pay | Admitting: Neurology

## 2016-06-20 ENCOUNTER — Telehealth: Payer: Self-pay | Admitting: *Deleted

## 2016-06-20 NOTE — Telephone Encounter (Signed)
Attempted to contact Aram BeechamCynthia, but received message that mailbox is full.  I need to know which pharmacy she wants Phentermine rx. faxed to/fim

## 2016-06-22 ENCOUNTER — Other Ambulatory Visit: Payer: Self-pay | Admitting: Neurology

## 2016-06-22 ENCOUNTER — Telehealth: Payer: Self-pay | Admitting: Neurology

## 2016-06-22 MED ORDER — MODAFINIL 200 MG PO TABS: ORAL_TABLET | ORAL | 5 refills | Status: DC

## 2016-06-22 NOTE — Telephone Encounter (Signed)
Rx's for Modafinil and Phentermine faxed to [facility] [address]/fim

## 2016-06-22 NOTE — Telephone Encounter (Signed)
Kathryn/ [facility] pharmacy called and is asking if it is ok for pt to have early refill on modafinil (PROVIGIL) 200 MG tablet. Pt is going out of town Friday. Please call [phone]

## 2016-06-22 NOTE — Telephone Encounter (Signed)
I have spoken with Samara Deist at [facility].  She sts. pt. is requesting early r/f of Modafinil due to travel plans.  Modafinil is due to be r/f on 06-29-16.  She is leaving for a cruise on 8-26.  Advised ok to r/f early/fim

## 2016-06-22 NOTE — Telephone Encounter (Signed)
Pt request refill for modafinil (PROVIGIL) 200 MG tablet [employer]. She is going on a cruise 8/26 and needs medication filled before she leaves, it is not due until 8/31 Phentermine should go to [facility]

## 2016-06-22 NOTE — Addendum Note (Signed)
Addended by: Candis SchatzMISENHEIMER, Celedonio Sortino I on: 06/22/2016 11:18 AM   Modules accepted: Orders

## 2016-06-27 ENCOUNTER — Other Ambulatory Visit: Payer: Self-pay | Admitting: *Deleted

## 2016-06-27 ENCOUNTER — Telehealth: Payer: Self-pay | Admitting: Neurology

## 2016-06-27 DIAGNOSIS — G894 Chronic pain syndrome: Secondary | ICD-10-CM

## 2016-06-27 MED ORDER — HYDROCODONE-ACETAMINOPHEN 5-325 MG PO TABS: 1.0000 | ORAL_TABLET | Freq: Four times a day (QID) | ORAL | 0 refills | Status: DC | PRN

## 2016-06-27 NOTE — Telephone Encounter (Signed)
Rx. awaiting RAS sig/fim 

## 2016-06-27 NOTE — Telephone Encounter (Signed)
Hydrocodone rx. up front [facility]/fim 

## 2016-06-27 NOTE — Telephone Encounter (Signed)
Pt request refill for HYDROcodone-acetaminophen (NORCO/VICODIN) 5-325 MG tablet °

## 2016-06-29 ENCOUNTER — Other Ambulatory Visit (INDEPENDENT_AMBULATORY_CARE_PROVIDER_SITE_OTHER): Payer: Self-pay

## 2016-06-29 DIAGNOSIS — G894 Chronic pain syndrome: Secondary | ICD-10-CM | POA: Diagnosis not present

## 2016-06-29 DIAGNOSIS — Z0289 Encounter for other administrative examinations: Secondary | ICD-10-CM

## 2016-06-30 LAB — DRUG SCREEN 12+ALCOHOL+CRT, UR
Amphetamines, Urine: NEGATIVE ng/mL
BENZODIAZ UR QL: NEGATIVE ng/mL
Barbiturate: NEGATIVE ng/mL
COCAINE (METABOLITE): NEGATIVE ng/mL
Cannabinoids: NEGATIVE ng/mL
Creatinine, Urine: 150.2 mg/dL (ref 20.0–300.0)
ETHANOL U, QUAN: NEGATIVE %
MEPERIDINE: NEGATIVE ng/mL
METHADONE: NEGATIVE ng/mL
OPIATE SCREEN URINE: NEGATIVE ng/mL
OXYCODONE+OXYMORPHONE UR QL SCN: NEGATIVE ng/mL
PHENCYCLIDINE: NEGATIVE ng/mL
PROPOXYPHENE: NEGATIVE ng/mL
TRAMADOL: NEGATIVE ng/mL

## 2016-07-07 ENCOUNTER — Telehealth: Payer: Self-pay | Admitting: *Deleted

## 2016-07-07 NOTE — Telephone Encounter (Signed)
-----   Message from Asa Lenteichard A Sater, MD sent at 07/07/2016 10:38 AM EDT ----- Please note that the urine drug screen showed that she have any hydrocodone in her system. We cannot write high-dose medication on a monthly basis (we were doing 120/month and [place] database shows +/- monthly refills at pharmacy) that is not being taken appropriately. By state guidelines, it is inappropriate for me to write any more prescriptions for opiates for her.    If she feels she continues to need opiate therapy, she can go to pain management clinic or her PCP.

## 2016-07-07 NOTE — Telephone Encounter (Signed)
I have spoken with Veronica Frazier this afternoon.  Pleasant conversation.  Per RAS, I advised that UDS showed no opiates.  RAS rx's Hydrocodone #120 per month for her chronic pain.  I have advised that the state has instituted new, stricter guidelines for the prescribing of these meds.  As her UDS was negative, RAS is no longer able to provide opiate rx's for her.  This is beyond his control.  He is following state guidelines.  She verbalized understanding of same, sts. she uses Hydrocodone as rx'd, that she even had taken 1/2 tab prior to coming to our office and submitting UDS./fim

## 2016-07-22 ENCOUNTER — Ambulatory Visit: Payer: PPO | Admitting: Neurology

## 2016-07-25 ENCOUNTER — Encounter: Payer: Self-pay | Admitting: Neurology

## 2016-08-17 ENCOUNTER — Ambulatory Visit: Payer: PPO | Admitting: Neurology

## 2016-08-31 ENCOUNTER — Encounter: Payer: Self-pay | Admitting: Neurology

## 2016-08-31 ENCOUNTER — Ambulatory Visit (INDEPENDENT_AMBULATORY_CARE_PROVIDER_SITE_OTHER): Payer: PPO | Admitting: Neurology

## 2016-08-31 VITALS — BP 132/78 | HR 80 | Resp 18 | Ht 67.0 in | Wt 191.0 lb

## 2016-08-31 DIAGNOSIS — R5382 Chronic fatigue, unspecified: Secondary | ICD-10-CM | POA: Diagnosis not present

## 2016-08-31 DIAGNOSIS — G4700 Insomnia, unspecified: Secondary | ICD-10-CM

## 2016-08-31 DIAGNOSIS — F329 Major depressive disorder, single episode, unspecified: Secondary | ICD-10-CM

## 2016-08-31 DIAGNOSIS — R269 Unspecified abnormalities of gait and mobility: Secondary | ICD-10-CM | POA: Diagnosis not present

## 2016-08-31 DIAGNOSIS — G35 Multiple sclerosis: Secondary | ICD-10-CM

## 2016-08-31 DIAGNOSIS — F32A Depression, unspecified: Secondary | ICD-10-CM

## 2016-08-31 MED ORDER — LORAZEPAM 1 MG PO TABS: 1.0000 mg | ORAL_TABLET | Freq: Every day | ORAL | 5 refills | Status: DC

## 2016-08-31 NOTE — Progress Notes (Signed)
[facility]  PATIENT: Veronica Frazier DOB: 03/04/1966  REFERRING CLINICIAN: Harlene RamusGretchen Velazquesz HISTORY FROM: patient REASON FOR VISIT: MS   HISTORICAL  CHIEF COMPLAINT:  Chief Complaint  Patient presents with  . Multiple Sclerosis    Sts. she continues to tolerate Tecfidera well.  Sts. she thinks right leg is some weker.  Sts. she is having more right heel pain./fim    HISTORY OF PRESENT ILLNESS:  Veronica Frazier is a 50 year old woman with MS and related symptoms.   She is currently on Tecfidera and tolerates it well.   She has not had any recent side effects on it (some GI issues at first).   She has noted her gait is a little worse and she has mire stumbles.  MS History:   She presented in 2000  with pain and tingling in the left arm. At that time, she had an MRI of the brain performed that was concerning for multiple sclerosis. Additionally, she had a lumbar puncture performed with CSF that was consistent with MS. Initially, she was placed on Avonex injections weekly. Initially, she did well with the Avonex but then had an MRI that showed some progression. About 3 years ago she switched to Tecfidera. She tolerates Tecfidera fairly well, having just a little bit of flushing  Gait/strength/sensation:   Her balance is poor.   Last year she fell and broke her shoulder.    She tires out quickly with walking.  She has more trouble inside and outside her home.  She veers to the right and hits the wall or doorframe while walking.   He has more trouble with ADLs due to not being able to walk as far.     She has mild right arm dysesthesia   Vision:   She continues to note bilateral visual blurring and decreased peripheral vision to the right.   Depth perception is often off.   Vision is worse if she has a headache.    Bladder:  She denies much difficulties with her bladder. Bowel is doing better (had colitis).    Pain:   She notes LBP that flares up at times.   She has  had some heel pain x 1 week.  Mood/ Cognition:  She feels mood is better on duloxetine and fluoxetine.   Anxiety is mild.    She has cognitive difficulties, especially with short-term memory and executive function.   Verbal fluency seems better recently.     Fatigue/sleep:  She has difficulty with fatigue and she has some daytime sleepiness. Provigil and phentermine helped her fatigue some .   She notes insomnia but lorazepam helps her fall asleep.   She still has issues with sleep maintenance.   She sometimes naps.     REVIEW OF SYSTEMS:  Constitutional: No fevers, chills, sweats, or change in appetite   She has fatigue Eyes: Blurry vision and rare diplopia.  No eye pain Ear, nose and throat: No hearing loss, ear pain, nasal congestion, sore throat Cardiovascular: No chest pain, palpitations Respiratory:  No shortness of breath at rest or with exertion.   No wheezes   She snores GastrointestinaI: No nausea but recent diarrhea and constipation.    Colitis being treated Genitourinary:  No dysuria, urinary retention or frequency.  No nocturia. Musculoskeletal:  No neck pain.  Mild axial back pain.   Right arm pain Integumentary: No rash, pruritus, skin lesions Neurological: as above Psychiatric:  She reports Depression.  No anxiety Endocrine: No palpitations,  diaphoresis.     Hematologic/Lymphatic:  No anemia, purpura, petechiae. Allergic/Immunologic: No itchy/runny eyes, nasal congestion, recent allergic reactions, rashes  ALLERGIES: No Known Allergies  HOME MEDICATIONS: Outpatient Medications Prior to Visit  Medication Sig Dispense Refill  . aspirin-acetaminophen-caffeine (EXCEDRIN MIGRAINE) 250-250-65 MG per tablet Take 1 tablet by mouth 3 (three) times daily as needed.      . cyclobenzaprine (FLEXERIL) 10 MG tablet Take 1 tablet (10 mg total) by mouth at bedtime. 90 tablet 3  . Dimethyl Fumarate 240 MG CPDR Take 1 capsule (240 mg total) by mouth 2 (two) times daily at 10 AM and 5 PM.  180 capsule 3  . DULoxetine (CYMBALTA) 60 MG capsule TAKE ONE CAPSULE BY MOUTH ONCE DAILY 90 capsule 2  . FLUoxetine (PROZAC) 40 MG capsule TAKE ONE CAPSULE BY MOUTH ONCE DAILY 90 capsule 0  . gabapentin (NEURONTIN) 300 MG capsule TAKE ONE CAPSULE BY MOUTH 4 TIMES DAILY 360 capsule 0  . HYDROcodone-acetaminophen (NORCO/VICODIN) 5-325 MG tablet Take 1 tablet by mouth every 6 (six) hours as needed. 120 tablet 0  . ibuprofen (ADVIL,MOTRIN) 800 MG tablet Take 800 mg by mouth once as needed. Reported on 01/15/2016    . lidocaine (LIDODERM) 5 % Place 1 patch onto the skin daily. 30 patch 5  . LORazepam (ATIVAN) 1 MG tablet Take 1 tablet (1 mg total) by mouth at bedtime. 30 tablet 5  . LOSARTAN POTASSIUM PO Take by mouth.    . meloxicam (MOBIC) 15 MG tablet Take 1 tablet (15 mg total) by mouth daily. 90 tablet 3  . modafinil (PROVIGIL) 200 MG tablet Take one-half tablet 3 times daily as needed. 135 tablet 5  . Multiple Vitamins-Calcium (ONE-A-DAY WOMENS FORMULA PO) Take 1 tablet by mouth daily.      Marland Kitchen omeprazole (PRILOSEC) 20 MG capsule     . phentermine 37.5 MG capsule TAKE ONE CAPSULE BY MOUTH ONCE DAILY IN THE MORNING 90 capsule 1   No facility-administered medications prior to visit.     PAST MEDICAL HISTORY: Past Medical History:  Diagnosis Date  . Hypertension   . Multiple sclerosis (HCC)   . Neuropathy (HCC)   . Stroke (HCC)   . Vision abnormalities     PAST SURGICAL HISTORY: Past Surgical History:  Procedure Laterality Date  . ABDOMINAL HYSTERECTOMY    . APPENDECTOMY    . BREAST SURGERY    . CHOLECYSTECTOMY    . TONSILLECTOMY      FAMILY HISTORY: Family History  Problem Relation Age of Onset  . Hypertension Mother   . Hyperlipidemia Mother   . Colitis Mother   . Hypertension Father     SOCIAL HISTORY:  Social History   Social History  . Marital status: Married    Spouse name: N/A  . Number of children: N/A  . Years of education: N/A   Occupational History  .  Not on file.   Social History Main Topics  . Smoking status: Former Games developer  . Smokeless tobacco: Not on file  . Alcohol use No  . Drug use: No  . Sexual activity: Yes    Birth control/ protection: Surgical   Other Topics Concern  . Not on file   Social History Narrative  . No narrative on file     PHYSICAL EXAM  Vitals:   08/31/16 1342  BP: 132/78  Pulse: 80  Resp: 18  Weight: 191 lb (86.6 kg)  Height: 5\' 7"  (1.702 m)    Body mass index  is 29.91 kg/m.   General: The patient is well-developed and well-nourished and in no acute distress   Skin: Extremities are without significant edema.   Neurologic Exam  Mental status: The patient is alert and oriented x 3 at the time of the examination. The patient has apparent normal recent and remote memory, with an apparently normal attention span and concentration ability.   Speech is normal.  Cranial nerves: Extraocular movements are full.   There is good facial sensation to soft touch bilaterally.Facial strength is normal.  Trapezius and sternocleidomastoid strength is normal. No dysarthria is noted.  The tongue is midline, and the patient has symmetric elevation of the soft palate. No obvious hearing deficits are noted.  Motor:  Muscle bulk and tone are normal. Strength is  5 / 5 in all 4 extremities.   Sensory: Sensory testing is intact to touch in all 4 extremities.  Coordination: Cerebellar testing reveals good finger-nose-finger bilaterally.  Gait and station: Station is normal.  Gait is wide with reduced stride.  Tandem gait is wide. Romberg is negative.   Reflexes: Deep tendon reflexes are symmetric and normal bilaterally.     DIAGNOSTIC DATA (LABS, IMAGING, TESTING) - I reviewed patient records, labs, notes, testing and imaging myself where available.  Lab Results  Component Value Date   WBC 6.1 02/16/2016   HGB 13.4 12/09/2014   HCT 41.6 02/16/2016   MCV 95 02/16/2016   PLT 232 02/16/2016        Component Value Date/Time   NA 133 (L) 11/19/2014 0705   K 3.3 (L) 11/19/2014 0705   CL 98 11/19/2014 0705   CO2 25 11/19/2014 0705   GLUCOSE 179 (H) 11/19/2014 0705   BUN 9 11/19/2014 0705   CREATININE 0.69 11/19/2014 0705   CALCIUM 8.3 (L) 11/19/2014 0705   PROT 6.9 06/13/2011 0306   ALBUMIN 3.6 06/13/2011 0306   AST 56 (H) 06/13/2011 0306   ALT 58 (H) 06/13/2011 0306   ALKPHOS 88 06/13/2011 0306   BILITOT 0.5 06/13/2011 0306   GFRNONAA >90 11/19/2014 0705   GFRAA >90 11/19/2014 0705       ASSESSMENT AND PLAN  Multiple sclerosis (HCC) - Plan: CBC with Differential/Platelet, MR BRAIN W WO CONTRAST, For home use only DME Wheelchair electric  Gait disturbance - Plan: CBC with Differential/Platelet, MR BRAIN W WO CONTRAST, For home use only DME Wheelchair electric  Insomnia, unspecified type  Chronic fatigue  Depression, unspecified depression type     1.    She will continue Tecfidera for now.  Check CBC with diff today.     MRI of the brain with and without contrast to determine if there has been any subclinical progression. If present, we will need to conside a more efficacious disease modifying therapy 2.  Continue Provigil, phentermine, and cyclobenzaprine 3.   Advised to try to stay active and exercises tolerated. 4.   Benefit from an electric wheelchair to help her with ADL's.     She will return to see me in 4 -5 months or sooner if she has new or worsening symptoms.   Cherylee Rawlinson A. Epimenio Foot, MD, PhD 08/31/2016, 1:47 PM Certified in Neurology, Clinical Neurophysiology, Sleep Medicine, Pain Medicine and Neuroimaging  [facility] [address], [address] [place], [place] [address] [phone]

## 2016-09-01 LAB — CBC WITH DIFFERENTIAL/PLATELET
BASOS: 1 %
Basophils Absolute: 0 10*3/uL (ref 0.0–0.2)
EOS (ABSOLUTE): 0.1 10*3/uL (ref 0.0–0.4)
Eos: 2 %
HEMATOCRIT: 40.2 % (ref 34.0–46.6)
HEMOGLOBIN: 13.7 g/dL (ref 11.1–15.9)
IMMATURE GRANS (ABS): 0 10*3/uL (ref 0.0–0.1)
Immature Granulocytes: 0 %
LYMPHS: 16 %
Lymphocytes Absolute: 0.8 10*3/uL (ref 0.7–3.1)
MCH: 32.1 pg (ref 26.6–33.0)
MCHC: 34.1 g/dL (ref 31.5–35.7)
MCV: 94 fL (ref 79–97)
Monocytes Absolute: 0.4 10*3/uL (ref 0.1–0.9)
Monocytes: 9 %
NEUTROS ABS: 3.7 10*3/uL (ref 1.4–7.0)
Neutrophils: 72 %
PLATELETS: 205 10*3/uL (ref 150–379)
RBC: 4.27 x10E6/uL (ref 3.77–5.28)
RDW: 13.9 % (ref 12.3–15.4)
WBC: 5.1 10*3/uL (ref 3.4–10.8)

## 2016-09-05 ENCOUNTER — Telehealth: Payer: Self-pay | Admitting: *Deleted

## 2016-09-05 NOTE — Telephone Encounter (Signed)
-----   Message from Asa Lente, MD sent at 09/02/2016  2:01 PM EDT ----- Please let her know that the lab work is fine.

## 2016-09-05 NOTE — Telephone Encounter (Signed)
I have spoken with Veronica Frazier this morning, and per RAS, advised that labs done in our office last week are ok.  She verbalized  understanding of same/fim

## 2016-09-19 IMAGING — CR DG CHEST 2V
2 series · 2 of 2 positions shown · non-contrast
Comparison: 06/13/2011

CLINICAL DATA: Shortness of Breath

EXAM:
CHEST - 2 VIEW

[w chest pa]
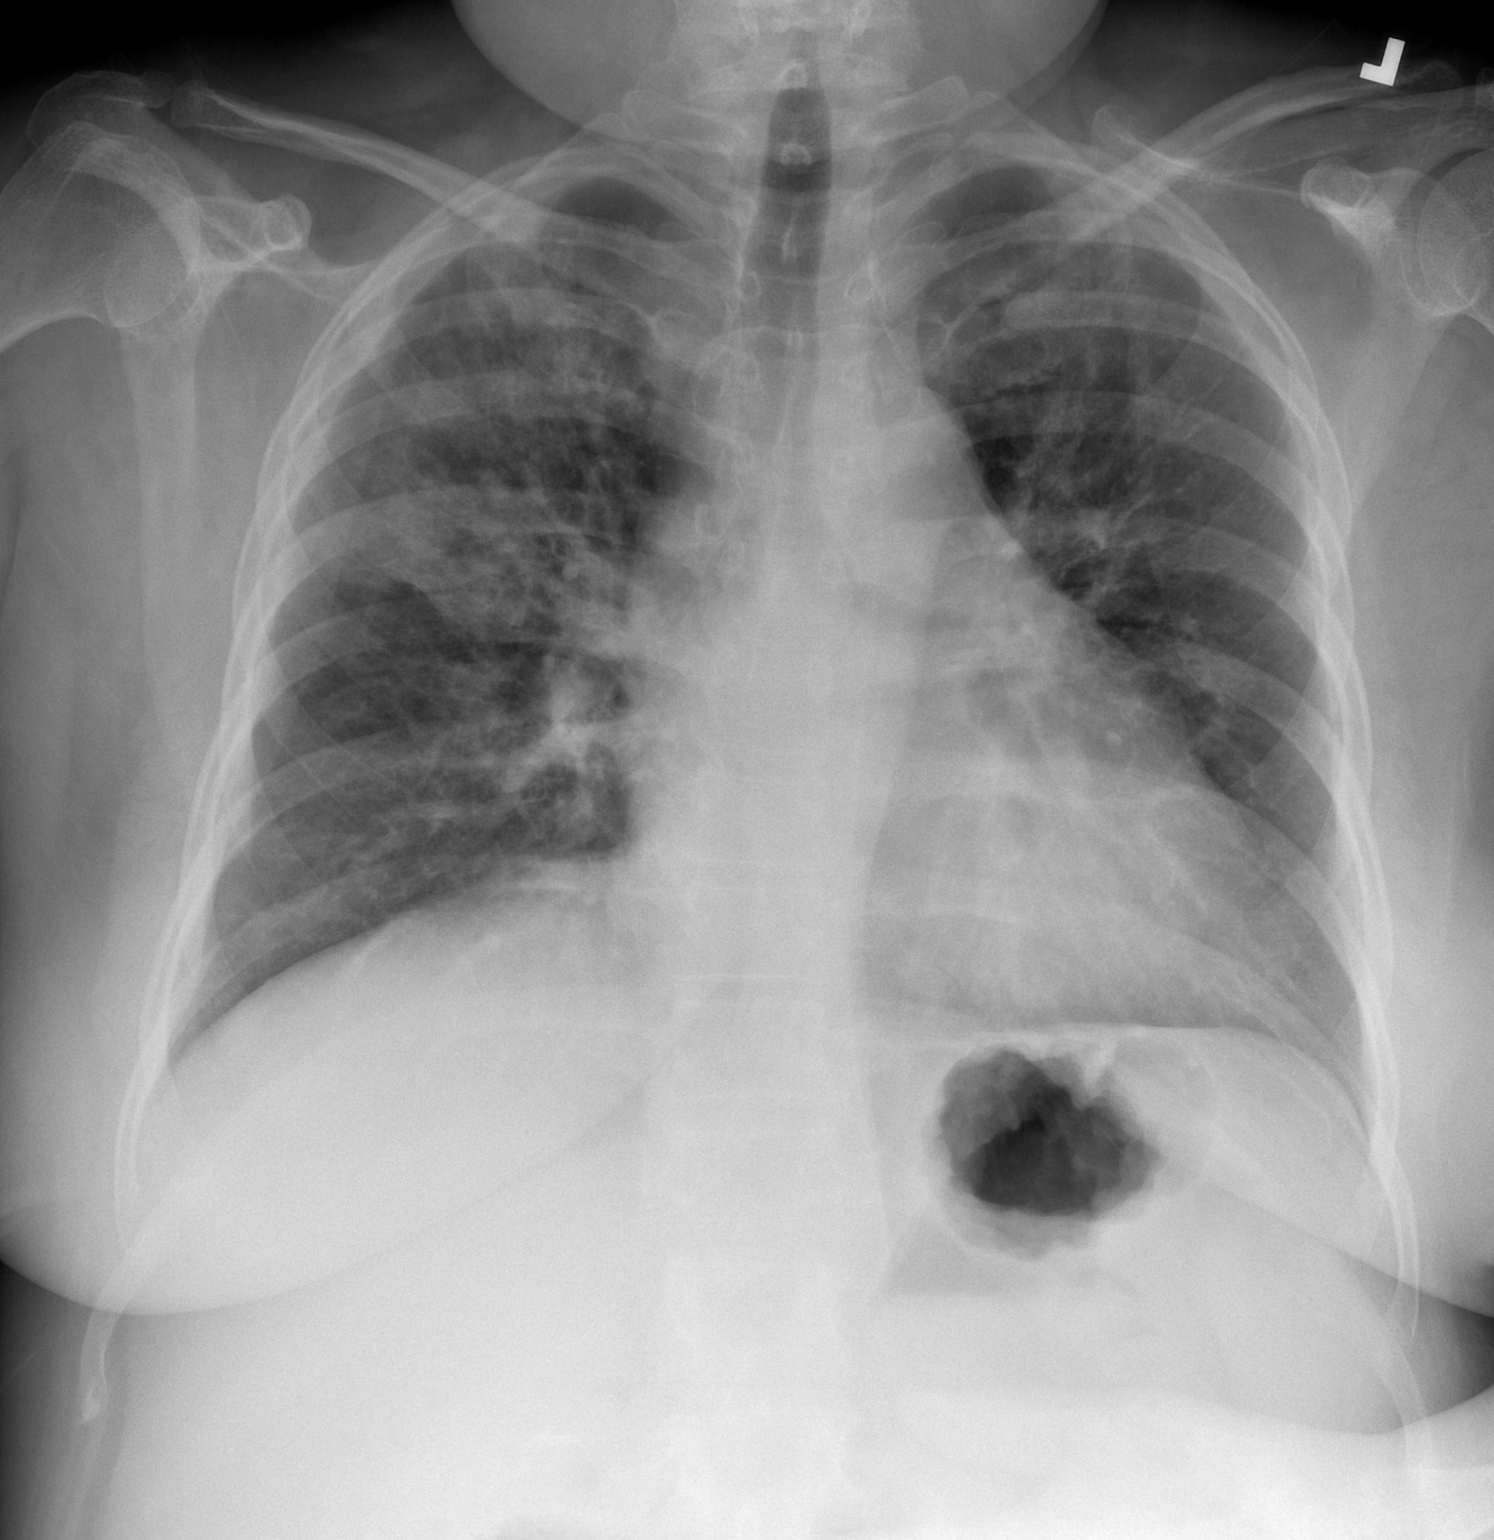

[w chest lat]
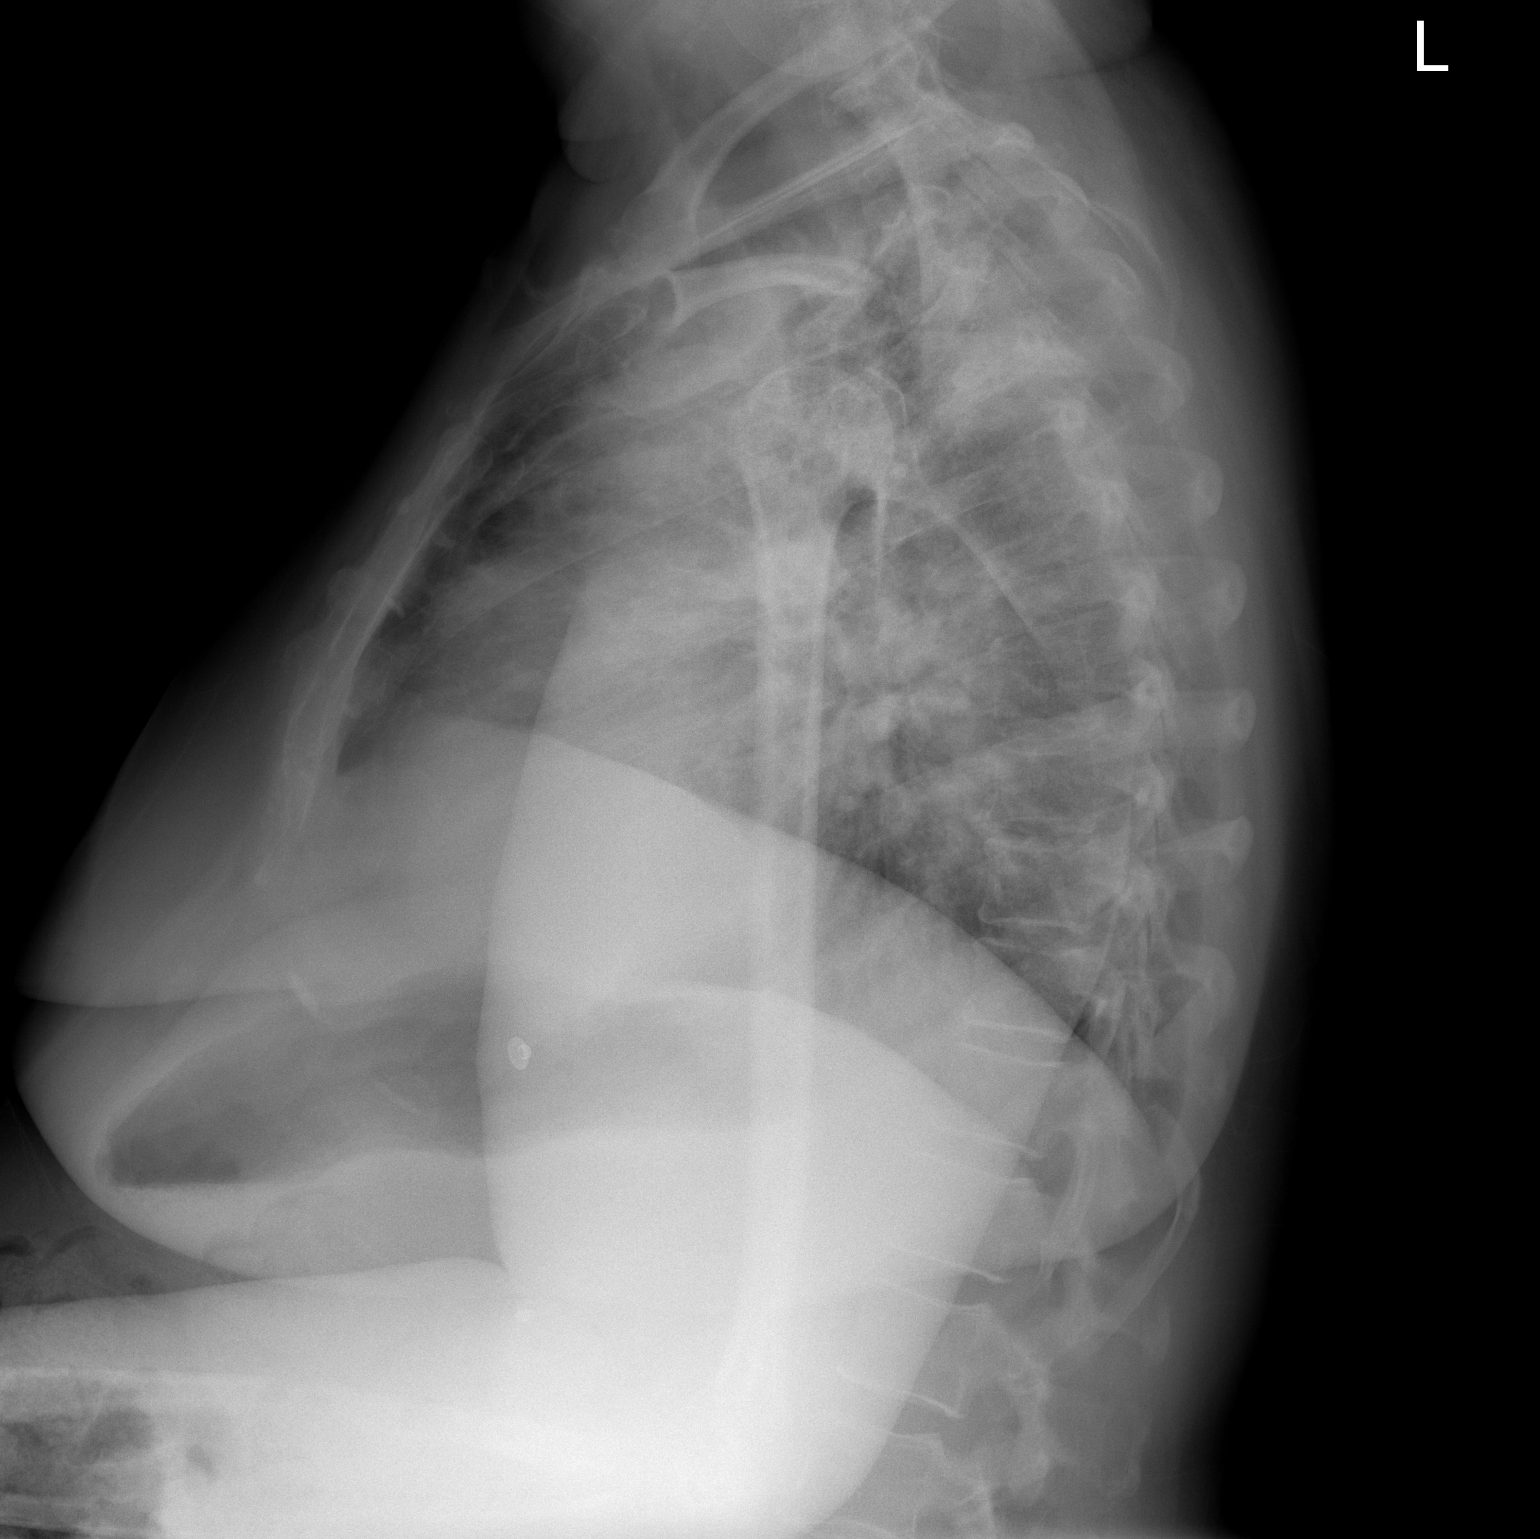

[2 of 2 positions shown; findings below may reference images not displayed]

FINDINGS: Cardiac shadow is within normal limits. Patchy infiltrate is noted
within the right upper lobe. No sizable effusion is noted. No bony
abnormality is seen.
IMPRESSION: Right upper lobe pneumonia. Followup PA and lateral chest X-ray is
recommended in 3-4 weeks following trial of antibiotic therapy to
ensure resolution and exclude underlying malignancy.

## 2016-09-23 ENCOUNTER — Other Ambulatory Visit: Payer: Self-pay | Admitting: Neurology

## 2016-10-06 ENCOUNTER — Other Ambulatory Visit: Payer: Self-pay | Admitting: Neurology

## 2016-10-07 ENCOUNTER — Other Ambulatory Visit: Payer: PPO

## 2016-10-12 ENCOUNTER — Other Ambulatory Visit: Payer: PPO

## 2016-12-19 ENCOUNTER — Other Ambulatory Visit: Payer: Self-pay | Admitting: Neurology

## 2017-01-03 ENCOUNTER — Other Ambulatory Visit: Payer: Self-pay | Admitting: Neurology

## 2017-01-03 ENCOUNTER — Ambulatory Visit: Payer: PPO | Admitting: Neurology

## 2017-01-04 ENCOUNTER — Encounter: Payer: Self-pay | Admitting: Neurology

## 2017-01-09 ENCOUNTER — Encounter: Payer: Self-pay | Admitting: *Deleted

## 2017-04-04 ENCOUNTER — Other Ambulatory Visit: Payer: Self-pay | Admitting: Neurology

## 2017-04-06 ENCOUNTER — Other Ambulatory Visit: Payer: Self-pay | Admitting: Neurology

## 2017-05-29 ENCOUNTER — Telehealth: Payer: Self-pay | Admitting: Neurology

## 2017-05-29 NOTE — Telephone Encounter (Signed)
I have spoken with Veronica Frazier and explained that, once RAS sees her, he can order SM if appropriate, to be done in our inf. suite same day as appt.  She verbalized understanding of same/fim

## 2017-05-29 NOTE — Telephone Encounter (Signed)
Pt called the office said she is very fatigued for about 10 days. She has an appt on 8/1, she is wanting to get IV for solumedrol the same day if possible. Please call

## 2017-05-31 ENCOUNTER — Encounter: Payer: Self-pay | Admitting: Neurology

## 2017-05-31 ENCOUNTER — Encounter (INDEPENDENT_AMBULATORY_CARE_PROVIDER_SITE_OTHER): Payer: Self-pay

## 2017-05-31 ENCOUNTER — Ambulatory Visit (INDEPENDENT_AMBULATORY_CARE_PROVIDER_SITE_OTHER): Payer: PPO | Admitting: Neurology

## 2017-05-31 VITALS — BP 126/79 | HR 85 | Resp 18 | Ht 67.0 in | Wt 190.5 lb

## 2017-05-31 DIAGNOSIS — R269 Unspecified abnormalities of gait and mobility: Secondary | ICD-10-CM

## 2017-05-31 DIAGNOSIS — H539 Unspecified visual disturbance: Secondary | ICD-10-CM

## 2017-05-31 DIAGNOSIS — R5382 Chronic fatigue, unspecified: Secondary | ICD-10-CM | POA: Diagnosis not present

## 2017-05-31 DIAGNOSIS — G4700 Insomnia, unspecified: Secondary | ICD-10-CM

## 2017-05-31 DIAGNOSIS — G35 Multiple sclerosis: Secondary | ICD-10-CM

## 2017-05-31 DIAGNOSIS — F32A Depression, unspecified: Secondary | ICD-10-CM

## 2017-05-31 DIAGNOSIS — F329 Major depressive disorder, single episode, unspecified: Secondary | ICD-10-CM

## 2017-05-31 MED ORDER — DOXEPIN HCL 10 MG PO CAPS: 10.0000 mg | ORAL_CAPSULE | Freq: Every day | ORAL | 11 refills | Status: DC

## 2017-05-31 MED ORDER — PHENTERMINE HCL 37.5 MG PO CAPS: ORAL_CAPSULE | ORAL | 1 refills | Status: DC

## 2017-05-31 MED ORDER — GABAPENTIN 300 MG PO CAPS: 300.0000 mg | ORAL_CAPSULE | Freq: Four times a day (QID) | ORAL | 3 refills | Status: DC

## 2017-05-31 NOTE — Progress Notes (Signed)
[facility]  PATIENT: Veronica Frazier DOB: 1966-03-08  REFERRING CLINICIAN: Harlene Ramus HISTORY FROM: patient REASON FOR VISIT: MS   HISTORICAL  CHIEF COMPLAINT:  Chief Complaint  Patient presents with  . Multiple Sclerosis    Sts. she continues to tolerate Tecfidera well.  Sts. vision has been blurry, bilat, for the last 3 mos. Denies double vision or eye pain. Has an appt. with her optometrist in 2 weeks.  Sts. fatigue is worse and requesting IV SoluMedrol today/fim    HISTORY OF PRESENT ILLNESS:  Veronica Frazier is a 51 year old woman with MS and related symptoms.     MS:  As a disease modifying therapy, she is on Tecfidera. She tolerates it well. Initially she had mild GI symptoms but these no longer occur. She does not have flushing.    She is noting more blurry vision and worse STM.   Gait/strength/sensation:   Her balance and gait are stable.   She has had a few falls but usually if she trips.   She tires out quickly with walking.  She has more trouble inside and outside her home.     She has mild right arm dysesthesia   Vision:   She notes more blurred vision that has been gradual.    Depth perception is often off.   Vision is worse if she has a headache.    Bladder:  She denies much difficulties with her bladder. Bowel is doing better (had colitis).    Pain:   Her back and hand pain has done better this year.      Mood/ Cognition:  She feels depression is mildly worse.    She is going through a divorce (18 years marriage).   She feels mood is better on duloxetine and fluoxetine.  She has cognitive difficulties, Specifically, she notes verbal fluency and short term memory.   Fatigue/sleep:  She has difficulty with fatigue and she has some daytime sleepiness. This is usually worse in the heat.   She gets benefit from Provigil (takes 1. To 1.5 pills daily) and phentermine.   She has sleep maintenance worse than sleep onset insomnia.   Lorazepam has  helped her fall asleep more than stay asleep.   Flexeril causes a hangover.      MS History:   She presented in 2000  with pain and tingling in the left arm. At that time, she had an MRI of the brain performed that was concerning for multiple sclerosis. Additionally, she had a lumbar puncture performed with CSF that was consistent with MS. Initially, she was placed on Avonex injections weekly. Initially, she did well with the Avonex but then had an MRI that showed some progression. About 2014 or 2015,  she switched to Tecfidera.     REVIEW OF SYSTEMS:  Constitutional: No fevers, chills, sweats, or change in appetite   She has fatigue Eyes: Blurry vision and rare diplopia.  No eye pain Ear, nose and throat: No hearing loss, ear pain, nasal congestion, sore throat Cardiovascular: No chest pain, palpitations Respiratory:  No shortness of breath at rest or with exertion.   No wheezes   She snores GastrointestinaI: No nausea but recent diarrhea and constipation.    Colitis being treated Genitourinary:  No dysuria, urinary retention or frequency.  No nocturia. Musculoskeletal:  No neck pain.  Mild axial back pain.   Right arm pain Integumentary: No rash, pruritus, skin lesions Neurological: as above Psychiatric:  She reports Depression.  No  anxiety Endocrine: No palpitations, diaphoresis.     Hematologic/Lymphatic:  No anemia, purpura, petechiae. Allergic/Immunologic: No itchy/runny eyes, nasal congestion, recent allergic reactions, rashes  ALLERGIES: No Known Allergies  HOME MEDICATIONS: Outpatient Medications Prior to Visit  Medication Sig Dispense Refill  . aspirin-acetaminophen-caffeine (EXCEDRIN MIGRAINE) 250-250-65 MG per tablet Take 1 tablet by mouth 3 (three) times daily as needed.      . Dimethyl Fumarate 240 MG CPDR Take 1 capsule (240 mg total) by mouth 2 (two) times daily at 10 AM and 5 PM. 180 capsule 3  . DULoxetine (CYMBALTA) 60 MG capsule TAKE ONE CAPSULE BY MOUTH ONCE DAILY 90  capsule 2  . FLUoxetine (PROZAC) 40 MG capsule TAKE ONE CAPSULE BY MOUTH ONCE DAILY 90 capsule 1  . LORazepam (ATIVAN) 1 MG tablet TAKE ONE TABLET BY MOUTH AT BEDTIME 30 tablet 5  . LOSARTAN POTASSIUM PO Take by mouth.    . meloxicam (MOBIC) 15 MG tablet TAKE ONE TABLET BY MOUTH ONCE DAILY 90 tablet 3  . modafinil (PROVIGIL) 200 MG tablet TAKE ONE-HALF TABLET BY MOUTH THREE TIMES DAILY AS NEEDED 135 tablet 5  . Multiple Vitamins-Calcium (ONE-A-DAY WOMENS FORMULA PO) Take 1 tablet by mouth daily.      Marland Kitchen omeprazole (PRILOSEC) 20 MG capsule     . cyclobenzaprine (FLEXERIL) 10 MG tablet Take 1 tablet (10 mg total) by mouth at bedtime. 90 tablet 3  . gabapentin (NEURONTIN) 300 MG capsule TAKE ONE CAPSULE BY MOUTH 4 TIMES DAILY 360 capsule 1  . HYDROcodone-acetaminophen (NORCO/VICODIN) 5-325 MG tablet Take 1 tablet by mouth every 6 (six) hours as needed. 120 tablet 0  . phentermine 37.5 MG capsule TAKE ONE CAPSULE BY MOUTH ONCE DAILY IN THE MORNING 90 capsule 1  . gabapentin (NEURONTIN) 300 MG capsule TAKE ONE CAPSULE BY MOUTH 4 TIMES DAILY 360 capsule 0  . ibuprofen (ADVIL,MOTRIN) 800 MG tablet Take 800 mg by mouth once as needed. Reported on 01/15/2016    . lidocaine (LIDODERM) 5 % Place 1 patch onto the skin daily. 30 patch 5   No facility-administered medications prior to visit.     PAST MEDICAL HISTORY: Past Medical History:  Diagnosis Date  . Hypertension   . Multiple sclerosis (HCC)   . Neuropathy   . Stroke (HCC)   . Vision abnormalities     PAST SURGICAL HISTORY: Past Surgical History:  Procedure Laterality Date  . ABDOMINAL HYSTERECTOMY    . APPENDECTOMY    . BREAST SURGERY    . CHOLECYSTECTOMY    . TONSILLECTOMY      FAMILY HISTORY: Family History  Problem Relation Age of Onset  . Hypertension Mother   . Hyperlipidemia Mother   . Colitis Mother   . Hypertension Father     SOCIAL HISTORY:  Social History   Social History  . Marital status: Married    Spouse  name: N/A  . Number of children: N/A  . Years of education: N/A   Occupational History  . Not on file.   Social History Main Topics  . Smoking status: Former Games developer  . Smokeless tobacco: Never Used  . Alcohol use No  . Drug use: No  . Sexual activity: Yes    Birth control/ protection: Surgical   Other Topics Concern  . Not on file   Social History Narrative  . No narrative on file     PHYSICAL EXAM  Vitals:   05/31/17 1525  BP: 126/79  Pulse: 85  Resp: 18  Weight: 190 lb 8 oz (86.4 kg)  Height: 5\' 7"  (1.702 m)    Body mass index is 29.84 kg/m.   General: The patient is well-developed and well-nourished and in no acute distress   HEENT:   Funduscopic examination shows normal optic discs and retinal vessels.  Skin: Extremities are without significant edema.   Neurologic Exam  Mental status: The patient is alert and oriented x 3 at the time of the examination. The patient has apparent normal recent and remote memory, with an apparently normal attention span and concentration ability.   Speech is normal.  Cranial nerves: Extraocular movements are full.   Facial strength and sensation is normal. Trapezius strength is normal..  The tongue is midline, and the patient has symmetric elevation of the soft palate. No obvious hearing deficits are noted.  Motor:  Muscle bulk and tone are normal. Strength is  5 / 5 in all 4 extremities.   Sensory: Sensory testing is intact to touch in all 4 extremities.  Coordination: Cerebellar testing reveals good finger-nose-finger bilaterally.  Gait and station: Station is normal. Her gait is wide with a mildly reduced stride. Tandem gait is wide. The Romberg is negative.  Reflexes: Deep tendon reflexes are symmetric and normal bilaterally.     DIAGNOSTIC DATA (LABS, IMAGING, TESTING) - I reviewed patient records, labs, notes, testing and imaging myself where available.  Lab Results  Component Value Date   WBC 5.1 08/31/2016     HGB 13.7 08/31/2016   HCT 40.2 08/31/2016   MCV 94 08/31/2016   PLT 205 08/31/2016      Component Value Date/Time   NA 133 (L) 11/19/2014 0705   K 3.3 (L) 11/19/2014 0705   CL 98 11/19/2014 0705   CO2 25 11/19/2014 0705   GLUCOSE 179 (H) 11/19/2014 0705   BUN 9 11/19/2014 0705   CREATININE 0.69 11/19/2014 0705   CALCIUM 8.3 (L) 11/19/2014 0705   PROT 6.9 06/13/2011 0306   ALBUMIN 3.6 06/13/2011 0306   AST 56 (H) 06/13/2011 0306   ALT 58 (H) 06/13/2011 0306   ALKPHOS 88 06/13/2011 0306   BILITOT 0.5 06/13/2011 0306   GFRNONAA >90 11/19/2014 0705   GFRAA >90 11/19/2014 0705       ASSESSMENT AND PLAN  Multiple sclerosis, relapsing-remitting (HCC) - Plan: CBC with Differential/Platelet, MR BRAIN W WO CONTRAST  Gait disturbance - Plan: MR BRAIN W WO CONTRAST  Chronic fatigue  Depression, unspecified depression type  Visual disturbance - Plan: MR BRAIN W WO CONTRAST  Insomnia, unspecified type    1.    Continue Tecfidera. Check CBC with differential.   MRI of the brain with and without contrast to determine if there has been any subclinical progression. If present, we will need to conside a more efficacious disease modifying therapy 2.  Continue Provigil, phentermine.   Add doxepin (stop Flexeril). 3.   Advised to try to stay active and exercises tolerated. 4.   She will go to ophtalmology for visual blurring.    5.   Due to her fatigue, I will have her get 1 g of IV Solu-Medrol today. She will return to see me in 4 -5 months or sooner if she has new or worsening symptoms.   Halil Rentz A. Epimenio Foot, MD, PhD 05/31/2017, 3:49 PM Certified in Neurology, Clinical Neurophysiology, Sleep Medicine, Pain Medicine and Neuroimaging  [facility] [address] [place], [place] [address] [phone]

## 2017-06-01 ENCOUNTER — Telehealth: Payer: Self-pay | Admitting: *Deleted

## 2017-06-01 LAB — CBC WITH DIFFERENTIAL/PLATELET
BASOS ABS: 0 10*3/uL (ref 0.0–0.2)
Basos: 1 %
EOS (ABSOLUTE): 0.1 10*3/uL (ref 0.0–0.4)
Eos: 2 %
Hematocrit: 36.4 % (ref 34.0–46.6)
Hemoglobin: 12.3 g/dL (ref 11.1–15.9)
IMMATURE GRANULOCYTES: 1 %
Immature Grans (Abs): 0 10*3/uL (ref 0.0–0.1)
Lymphocytes Absolute: 1.1 10*3/uL (ref 0.7–3.1)
Lymphs: 21 %
MCH: 31.4 pg (ref 26.6–33.0)
MCHC: 33.8 g/dL (ref 31.5–35.7)
MCV: 93 fL (ref 79–97)
MONOS ABS: 0.5 10*3/uL (ref 0.1–0.9)
Monocytes: 10 %
NEUTROS PCT: 65 %
Neutrophils Absolute: 3.3 10*3/uL (ref 1.4–7.0)
PLATELETS: 208 10*3/uL (ref 150–379)
RBC: 3.92 x10E6/uL (ref 3.77–5.28)
RDW: 14.1 % (ref 12.3–15.4)
WBC: 5.1 10*3/uL (ref 3.4–10.8)

## 2017-06-01 NOTE — Telephone Encounter (Signed)
-----   Message from Asa Lente, MD sent at 06/01/2017  1:12 PM EDT ----- Please let the patient know that the lab work is fine.

## 2017-06-01 NOTE — Telephone Encounter (Signed)
I have spoken with Veronica Frazier this afternoon, and per RAS, advised lab work done in our office is fine.  She verbalized understanding of same/fim

## 2017-06-15 ENCOUNTER — Inpatient Hospital Stay: Admission: RE | Admit: 2017-06-15 | Payer: PPO | Source: Ambulatory Visit

## 2017-06-26 ENCOUNTER — Inpatient Hospital Stay: Admission: RE | Admit: 2017-06-26 | Payer: PPO | Source: Ambulatory Visit

## 2017-07-11 ENCOUNTER — Telehealth: Payer: Self-pay | Admitting: Neurology

## 2017-07-11 MED ORDER — PHENTERMINE HCL 37.5 MG PO CAPS: ORAL_CAPSULE | ORAL | 1 refills | Status: DC

## 2017-07-11 NOTE — Telephone Encounter (Signed)
Pt calling asking for a call back with what pharmacy her prescription was called into.  Pt states they are normally called into the  [facility] [address] [place], [place] - [address] [phone] (Phone) [phone] (Fax)   Or the [facility] and neither of these locations has it.  Pt states she has also called the [facility] and they dont have it.  Pt states that [facility] in years please call.  This is for her phentermine 37.5 MG capsule

## 2017-07-11 NOTE — Telephone Encounter (Signed)
I have spoken [facility] at [facility]. He confirmed they never received Phentermine rx. printed on 05/31/17.  [place] [facility] registry does not show that it was ever filled.  Rx. reprinted, awaiting RAS sig/fim

## 2017-07-11 NOTE — Addendum Note (Signed)
Addended by: Candis Schatz I on: 07/11/2017 10:45 AM   Modules accepted: Orders

## 2017-07-11 NOTE — Telephone Encounter (Signed)
Pt called back she said [facility] did not rec RX. Please send to [facility] [place] [address]  Thank you

## 2017-07-11 NOTE — Telephone Encounter (Signed)
Phentermine rx. faxed to [facility] with fax confirmation received/fim

## 2017-07-11 NOTE — Telephone Encounter (Signed)
Spoke with Oria this morning.  Phentermine was sent to [facility] on [address] [place]. She will pick it up there.  Pharmacy list corrected so that future rx's only go to [facility] [address]/fim

## 2017-07-25 ENCOUNTER — Other Ambulatory Visit: Payer: Self-pay | Admitting: Neurology

## 2017-08-04 ENCOUNTER — Other Ambulatory Visit: Payer: PPO

## 2017-08-10 ENCOUNTER — Other Ambulatory Visit: Payer: Medicare Other

## 2017-08-10 DIAGNOSIS — Z1231 Encounter for screening mammogram for malignant neoplasm of breast: Secondary | ICD-10-CM | POA: Diagnosis not present

## 2017-08-17 DIAGNOSIS — F17200 Nicotine dependence, unspecified, uncomplicated: Secondary | ICD-10-CM | POA: Diagnosis not present

## 2017-08-17 DIAGNOSIS — J069 Acute upper respiratory infection, unspecified: Secondary | ICD-10-CM | POA: Diagnosis not present

## 2017-08-18 ENCOUNTER — Ambulatory Visit: Payer: PPO | Admitting: Clinical

## 2017-08-20 ENCOUNTER — Other Ambulatory Visit: Payer: Medicare Other

## 2017-08-29 ENCOUNTER — Other Ambulatory Visit: Payer: PPO

## 2017-09-01 ENCOUNTER — Ambulatory Visit
Admission: RE | Admit: 2017-09-01 | Discharge: 2017-09-01 | Disposition: A | Discharge status: Home / Self Care | Payer: PPO | Source: Ambulatory Visit | Attending: Neurology | Admitting: Neurology

## 2017-09-01 DIAGNOSIS — R269 Unspecified abnormalities of gait and mobility: Secondary | ICD-10-CM

## 2017-09-01 DIAGNOSIS — H539 Unspecified visual disturbance: Secondary | ICD-10-CM

## 2017-09-01 DIAGNOSIS — G35 Multiple sclerosis: Secondary | ICD-10-CM

## 2017-09-01 MED ORDER — GADOBENATE DIMEGLUMINE 529 MG/ML IV SOLN
18.0000 mL | Freq: Once | INTRAVENOUS | Status: AC | PRN
  Administered 2017-09-01: 18 mL via INTRAVENOUS

## 2017-09-05 ENCOUNTER — Telehealth: Payer: Self-pay | Admitting: *Deleted

## 2017-09-05 MED ORDER — DIMETHYL FUMARATE 240 MG PO CPDR: 240.0000 mg | DELAYED_RELEASE_CAPSULE | Freq: Two times a day (BID) | ORAL | 3 refills | Status: DC

## 2017-09-05 NOTE — Telephone Encounter (Signed)
Tecfidera escribed to [facility] per faxed request from [facility]/fim

## 2017-09-06 ENCOUNTER — Telehealth: Payer: Self-pay | Admitting: Neurology

## 2017-09-06 NOTE — Telephone Encounter (Signed)
Pt request MRI results.  °

## 2017-09-06 NOTE — Telephone Encounter (Signed)
Spoke with Aram BeechamCynthia and explained MRI showed old MS changes but nothing that looked brand new.  She verbalized understanding of same; will continue Tecfidera/fim

## 2017-09-07 ENCOUNTER — Ambulatory Visit: Payer: Self-pay | Admitting: Clinical

## 2017-09-11 ENCOUNTER — Telehealth: Payer: Self-pay | Admitting: Neurology

## 2017-09-11 DIAGNOSIS — R9082 White matter disease, unspecified: Secondary | ICD-10-CM | POA: Insufficient documentation

## 2017-09-11 NOTE — Telephone Encounter (Signed)
I spoke to Veronica Frazier about the MRI of the brain from 09/01/2017 that I compared against the MRI from 02-2012 ([facility]). In the interim, she has developed more white matter foci. Most of these appear to be nonspecific and may not be related to MS. I let her know that I would like to check some blood work.  She is a former smoker and she has hypertension that was diagnosed this year. She was started on  Losartan.   She also was diagnosed with hyperlipidemia and started on an agent  She is advised to take one 81 mg aspirin a day.

## 2017-09-12 ENCOUNTER — Other Ambulatory Visit (INDEPENDENT_AMBULATORY_CARE_PROVIDER_SITE_OTHER): Payer: Self-pay

## 2017-09-12 DIAGNOSIS — R9082 White matter disease, unspecified: Secondary | ICD-10-CM

## 2017-09-12 DIAGNOSIS — Z0289 Encounter for other administrative examinations: Secondary | ICD-10-CM

## 2017-09-14 ENCOUNTER — Telehealth: Payer: Self-pay | Admitting: Neurology

## 2017-09-14 ENCOUNTER — Telehealth: Payer: Self-pay | Admitting: *Deleted

## 2017-09-14 DIAGNOSIS — F32A Depression, unspecified: Secondary | ICD-10-CM

## 2017-09-14 DIAGNOSIS — F329 Major depressive disorder, single episode, unspecified: Secondary | ICD-10-CM

## 2017-09-14 DIAGNOSIS — R4586 Emotional lability: Secondary | ICD-10-CM

## 2017-09-14 LAB — PAN-ANCA
ANCA Proteinase 3: <3.5 U/mL (ref 0.0–3.5)
Atypical pANCA: <1:20 {titer}
C-ANCA: <1:20 {titer}

## 2017-09-14 LAB — HOMOCYSTEINE: Homocysteine: 16.1 umol/L — ABNORMAL HIGH (ref 0.0–15.0)

## 2017-09-14 MED ORDER — METAFOLBIC 6-1-50-5 MG PO TABS: 1.0000 | ORAL_TABLET | Freq: Every day | ORAL | 3 refills | Status: DC

## 2017-09-14 NOTE — Telephone Encounter (Signed)
Spoke with July and per RAS, reviewed lab results, along with rec. to start Metafolbic.  she verbalized understanding of same, is agreeable. Rx. [facility]ribed [facility] per her request/fim

## 2017-09-14 NOTE — Telephone Encounter (Signed)
Spoke with pt.  She denies HI/SI, but does c/o worsening mood swings and sts. her family feels she may be bipolar.  RAS has tried Cymbalta and Prozac for her mood d/o, depression.  I have explained that he would like her to see someone who truly specializes in psychiatric d/o, behavioral health, in order to ensure a correct dx, correct tx.  She verbalized understanding of same, is agreeable to referral.  Order placed in Epic/fim

## 2017-09-14 NOTE — Telephone Encounter (Signed)
-----   Message from Asa Lenteichard A Sater, MD sent at 09/14/2017  8:50 AM EST ----- Please let her know that The homocysteine is mildly elevated.    Start Metafolbic #30 one po daily  12 refills

## 2017-09-14 NOTE — Telephone Encounter (Signed)
Pt called said the depression is worse, foggy headed, memory worse, mood swings, crying easly. Pt is wanting to be seen. Please call to advise

## 2017-09-15 DIAGNOSIS — R05 Cough: Secondary | ICD-10-CM | POA: Diagnosis not present

## 2017-10-02 ENCOUNTER — Other Ambulatory Visit: Payer: Self-pay | Admitting: Neurology

## 2017-10-18 ENCOUNTER — Ambulatory Visit: Payer: PPO | Admitting: Podiatry

## 2017-10-25 ENCOUNTER — Telehealth: Payer: Self-pay | Admitting: Neurology

## 2017-10-25 NOTE — Telephone Encounter (Addendum)
Pt called she has not rec'd a call for referral. Please call to advise (she is aware Faith and Dr Epimenio FootSater are out of the office this week and this can wait until they return)

## 2017-10-25 NOTE — Telephone Encounter (Signed)
error 

## 2017-11-01 ENCOUNTER — Emergency Department (HOSPITAL_BASED_OUTPATIENT_CLINIC_OR_DEPARTMENT_OTHER)
Admission: EM | Admit: 2017-11-01 | Discharge: 2017-11-01 | Disposition: A | Discharge status: Home / Self Care | Payer: PPO | Attending: Emergency Medicine | Admitting: Emergency Medicine

## 2017-11-01 ENCOUNTER — Encounter (HOSPITAL_BASED_OUTPATIENT_CLINIC_OR_DEPARTMENT_OTHER): Payer: Self-pay

## 2017-11-01 ENCOUNTER — Other Ambulatory Visit: Payer: Self-pay

## 2017-11-01 DIAGNOSIS — R197 Diarrhea, unspecified: Secondary | ICD-10-CM | POA: Insufficient documentation

## 2017-11-01 DIAGNOSIS — R112 Nausea with vomiting, unspecified: Secondary | ICD-10-CM | POA: Insufficient documentation

## 2017-11-01 DIAGNOSIS — I10 Essential (primary) hypertension: Secondary | ICD-10-CM | POA: Diagnosis not present

## 2017-11-01 DIAGNOSIS — Z79899 Other long term (current) drug therapy: Secondary | ICD-10-CM | POA: Insufficient documentation

## 2017-11-01 DIAGNOSIS — F17200 Nicotine dependence, unspecified, uncomplicated: Secondary | ICD-10-CM | POA: Diagnosis not present

## 2017-11-01 DIAGNOSIS — R109 Unspecified abdominal pain: Secondary | ICD-10-CM | POA: Diagnosis not present

## 2017-11-01 LAB — CBC WITH DIFFERENTIAL/PLATELET
BASOS ABS: 0 10*3/uL (ref 0.0–0.1)
Basophils Relative: 0 %
Eosinophils Absolute: 0.1 10*3/uL (ref 0.0–0.7)
Eosinophils Relative: 1 %
HEMATOCRIT: 38.4 % (ref 36.0–46.0)
HEMOGLOBIN: 12.6 g/dL (ref 12.0–15.0)
LYMPHS PCT: 7 %
Lymphs Abs: 0.4 10*3/uL — ABNORMAL LOW (ref 0.7–4.0)
MCH: 31.5 pg (ref 26.0–34.0)
MCHC: 32.8 g/dL (ref 30.0–36.0)
MCV: 96 fL (ref 78.0–100.0)
Monocytes Absolute: 0.5 10*3/uL (ref 0.1–1.0)
Monocytes Relative: 8 %
NEUTROS ABS: 5 10*3/uL (ref 1.7–7.7)
Neutrophils Relative %: 84 %
Platelets: 179 10*3/uL (ref 150–400)
RBC: 4 MIL/uL (ref 3.87–5.11)
RDW: 13.5 % (ref 11.5–15.5)
WBC: 6 10*3/uL (ref 4.0–10.5)

## 2017-11-01 LAB — URINALYSIS, ROUTINE W REFLEX MICROSCOPIC
Bilirubin Urine: NEGATIVE
GLUCOSE, UA: NEGATIVE mg/dL
Hgb urine dipstick: NEGATIVE
Ketones, ur: NEGATIVE mg/dL
LEUKOCYTES UA: NEGATIVE
Nitrite: NEGATIVE
PROTEIN: NEGATIVE mg/dL
Specific Gravity, Urine: <1.005 — ABNORMAL LOW (ref 1.005–1.030)
pH: 7 (ref 5.0–8.0)

## 2017-11-01 LAB — COMPREHENSIVE METABOLIC PANEL
ALBUMIN: 4 g/dL (ref 3.5–5.0)
ALT: 26 U/L (ref 14–54)
ANION GAP: 9 (ref 5–15)
AST: 23 U/L (ref 15–41)
Alkaline Phosphatase: 78 U/L (ref 38–126)
BILIRUBIN TOTAL: 0.7 mg/dL (ref 0.3–1.2)
BUN: 16 mg/dL (ref 6–20)
CO2: 29 mmol/L (ref 22–32)
Calcium: 8.6 mg/dL — ABNORMAL LOW (ref 8.9–10.3)
Chloride: 99 mmol/L — ABNORMAL LOW (ref 101–111)
Creatinine, Ser: 0.63 mg/dL (ref 0.44–1.00)
GFR calc Af Amer: >60 mL/min (ref >60)
GFR calc non Af Amer: >60 mL/min (ref >60)
GLUCOSE: 123 mg/dL — AB (ref 65–99)
POTASSIUM: 3.5 mmol/L (ref 3.5–5.1)
Sodium: 137 mmol/L (ref 135–145)
TOTAL PROTEIN: 6.9 g/dL (ref 6.5–8.1)

## 2017-11-01 LAB — LIPASE, BLOOD: Lipase: 24 U/L (ref 11–51)

## 2017-11-01 MED ORDER — ONDANSETRON 4 MG PO TBDP: 4.0000 mg | ORAL_TABLET | Freq: Three times a day (TID) | ORAL | 0 refills | Status: DC | PRN

## 2017-11-01 MED ORDER — SODIUM CHLORIDE 0.9 % IV BOLUS (SEPSIS)
1000.0000 mL | Freq: Once | INTRAVENOUS | Status: AC
  Administered 2017-11-01: 1000 mL via INTRAVENOUS

## 2017-11-01 MED ORDER — ONDANSETRON HCL 4 MG/2ML IJ SOLN
4.0000 mg | Freq: Once | INTRAMUSCULAR | Status: AC | PRN
  Administered 2017-11-01: 4 mg via INTRAVENOUS
  Filled 2017-11-01: qty 2

## 2017-11-01 MED ORDER — ACETAMINOPHEN 500 MG PO TABS
1000.0000 mg | ORAL_TABLET | Freq: Once | ORAL | Status: AC
  Administered 2017-11-01: 1000 mg via ORAL
  Filled 2017-11-01: qty 2

## 2017-11-01 NOTE — Discharge Instructions (Signed)
Please read instructions below. °Drink clear liquids until your stomach feels better. Then, slowly introduce bland foods into your diet as tolerated, such as bread, rice, apples, bananas. °You can take zofran every 8 hours as needed for nausea. °Follow up with your primary care if symptoms persist. °Return to the ER for severe abdominal pain, fever, uncontrollable vomiting, or new or concerning symptoms. ° °

## 2017-11-01 NOTE — ED Notes (Signed)
Pt sts she is drinking her water that she brought from home.  Has not vomited.

## 2017-11-01 NOTE — ED Triage Notes (Signed)
Pt c/o n/v/d since 9am-NAD-steady gait

## 2017-11-01 NOTE — ED Provider Notes (Signed)
[facility] EMERGENCY DEPARTMENT Provider Note   CSN: [id number] Arrival date & time: 11/01/17  1840     History   Chief Complaint Chief Complaint  Patient presents with  . Emesis    HPI Veronica Frazier is a 52 y.o. female w PMHx MS, HTN, Stroke, presenting to ED with N/V/D since this morning. Pt states sx began w nausea and progressed to vomiting followed by diarrhea. Denies F/C, hematemesis, bilious emesis, blood in stool, urinary sx. States her abdomen has been getting sore throughout the day and she has developed a minor headache. No known sick contacts.  Past abdominal surgical history includes appendectomy, cholecystectomy, hysterectomy.  The history is provided by the patient.    Past Medical History:  Diagnosis Date  . Hypertension   . Multiple sclerosis (HCC)   . Neuropathy   . Stroke (HCC)   . Vision abnormalities     Patient Active Problem List   Diagnosis Date Noted  . White matter abnormality on MRI of brain 09/11/2017  . Gait disturbance 10/13/2015  . Disturbed cognition 04/09/2015  . Right arm pain 04/09/2015  . Multiple sclerosis, relapsing-remitting (HCC) 12/09/2014  . Chronic fatigue 12/09/2014  . Depression 12/09/2014  . Insomnia 12/09/2014  . Visual disturbance 12/09/2014  . 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 Essential (primary) hypertension 09/03/2014  . HLD (hyperlipidemia) 04/21/2014  . Encounter for general adult medical examination without abnormal findings 04/18/2014  . Multiple sclerosis (HCC) 04/06/2013    Past Surgical History:  Procedure Laterality Date  . ABDOMINAL HYSTERECTOMY    . APPENDECTOMY    . BREAST SURGERY    . CHOLECYSTECTOMY    . TONSILLECTOMY      OB History    No data available       Home Medications    Prior to Admission medications   Medication Sig Start Date End Date Taking? Authorizing Provider  aspirin-acetaminophen-caffeine (EXCEDRIN MIGRAINE)  [phone] MG per tablet Take 1 tablet by mouth 3 (three) times daily as needed.      [provider]  Dimethyl Fumarate 240 MG CPDR Take 1 capsule (240 mg total) 2 (two) times daily at 10 AM and 5 PM by mouth. 09/05/17   Sater, Pearletha Furl, MD  doxepin (SINEQUAN) 10 MG capsule Take 1 capsule (10 mg total) by mouth at bedtime. 05/31/17   Sater, Pearletha Furl, MD  DULoxetine (CYMBALTA) 60 MG capsule TAKE ONE CAPSULE BY MOUTH ONCE DAILY 04/05/17   Sater, Pearletha Furl, MD  FLUoxetine (PROZAC) 40 MG capsule TAKE 1 CAPSULE BY MOUTH ONCE DAILY 10/03/17   Sater, Pearletha Furl, MD  gabapentin (NEURONTIN) 300 MG capsule Take 1 capsule (300 mg total) by mouth 4 (four) times daily. 05/31/17   Sater, Pearletha Furl, MD  L-Methylfolate-B12-B6-B2 (METAFOLBIC) 03-31-49-5 MG TABS Take 1 tablet daily by mouth. 09/14/17   Sater, Pearletha Furl, MD  LORazepam (ATIVAN) 1 MG tablet TAKE ONE TABLET BY MOUTH AT BEDTIME 04/07/17   Sater, Pearletha Furl, MD  LOSARTAN POTASSIUM PO Take by mouth.    [provider]  meloxicam (MOBIC) 15 MG tablet TAKE ONE TABLET BY MOUTH ONCE DAILY 12/19/16   Sater, Pearletha Furl, MD  modafinil (PROVIGIL) 200 MG tablet TAKE 1/2 (ONE-HALF) TABLET BY MOUTH THREE TIMES DAILY AS NEEDED 07/25/17   Sater, Pearletha Furl, MD  Multiple Vitamins-Calcium (ONE-A-DAY WOMENS FORMULA PO) Take 1 tablet by mouth daily.      [provider]  omeprazole (PRILOSEC) 20  MG capsule  03/02/15   [provider]  ondansetron (ZOFRAN ODT) 4 MG disintegrating tablet Take 1 tablet (4 mg total) by mouth every 8 (eight) hours as needed for nausea or vomiting. 11/01/17   Chelsei Mcchesney, [place] N, PA-C  phentermine 37.5 MG capsule TAKE ONE CAPSULE BY MOUTH ONCE DAILY IN THE MORNING 07/11/17   Sater, Pearletha Furl, MD    Family History Family History  Problem Relation Age of Onset  . Hypertension Mother   . Hyperlipidemia Mother   . Colitis Mother   . Hypertension Father     Social History Social History   Tobacco Use  . Smoking status:  Current Every Day Smoker  . Smokeless tobacco: Never Used  Substance Use Topics  . Alcohol use: No    Alcohol/week: 0.0 oz  . Drug use: No     Allergies   Patient has no known allergies.   Review of Systems Review of Systems  Constitutional: Negative for fever.  Gastrointestinal: Positive for abdominal pain, diarrhea, nausea and vomiting. Negative for blood in stool.  Genitourinary: Negative for dysuria and frequency.  All other systems reviewed and are negative.    Physical Exam Updated Vital Signs BP 121/76 (BP Location: Left Arm)   Pulse 94   Temp 98 F (36.7 C) (Oral)   Resp 20   Ht 5\' 7"  (1.702 m)   Wt 91.2 kg (201 lb)   SpO2 96%   BMI 31.48 kg/m   Physical Exam  Constitutional: She appears well-developed and well-nourished. No distress.  HENT:  Head: Normocephalic and atraumatic.  Mouth/Throat: Oropharynx is clear and moist.  Eyes: Conjunctivae are normal.  Cardiovascular: Normal rate, regular rhythm, normal heart sounds and intact distal pulses.  Pulmonary/Chest: Effort normal and breath sounds normal.  Abdominal: Soft. Bowel sounds are normal. She exhibits no distension and no mass. There is no tenderness. There is no rebound and no guarding.  Neurological: She is alert.  Skin: Skin is warm.  Psychiatric: She has a normal mood and affect. Her behavior is normal.  Nursing note and vitals reviewed.    ED Treatments / Results  Labs (all labs ordered are listed, but only abnormal results are displayed) Labs Reviewed  COMPREHENSIVE METABOLIC PANEL - Abnormal; Notable for the following components:      Result Value   Chloride 99 (*)    Glucose, Bld 123 (*)    Calcium 8.6 (*)    All other components within normal limits  URINALYSIS, ROUTINE W REFLEX MICROSCOPIC - Abnormal; Notable for the following components:   Specific Gravity, Urine <1.005 (*)    All other components within normal limits  CBC WITH DIFFERENTIAL/PLATELET - Abnormal; Notable for the  following components:   Lymphs Abs 0.4 (*)    All other components within normal limits  LIPASE, BLOOD    EKG  EKG Interpretation None       Radiology No results found.  Procedures Procedures (including critical care time)  Medications Ordered in ED Medications  ondansetron (ZOFRAN) injection 4 mg (4 mg Intravenous Given 11/01/17 2000)  sodium chloride 0.9 % bolus 1,000 mL (0 mLs Intravenous Stopped 11/01/17 2108)  acetaminophen (TYLENOL) tablet 1,000 mg (1,000 mg Oral Given 11/01/17 2108)     Initial Impression / Assessment and Plan / ED Course  I have reviewed the triage vital signs and the nursing notes.  Pertinent labs & imaging results that were available during my care of the patient were reviewed by me and considered in  my medical decision making (see chart for details).     Patient with symptoms consistent with viral gastroenteritis.  Vitals are stable, no fever.  No signs of dehydration, tolerating PO fluids > 6 oz.  Lungs are clear.  No focal abdominal pain, no concern for pancreatitis, ruptured viscus, UTI, kidney stone, or any other abdominal etiology.  Supportive therapy indicated with return if symptoms worsen.  Patient counseled.  Discussed results, findings, treatment and follow up. Patient advised of return precautions. Patient verbalized understanding and agreed with plan.  Final Clinical Impressions(s) / ED Diagnoses   Final diagnoses:  Nausea vomiting and diarrhea    ED Discharge Orders        Ordered    ondansetron (ZOFRAN ODT) 4 MG disintegrating tablet  Every 8 hours PRN     11/01/17 2116       Quentez Lober, [place] N, PA-C 11/01/17 2132    Little, Ambrose [place], MD 11/03/17 1454

## 2017-11-01 NOTE — ED Notes (Signed)
Provider in room and will returned to get vitals when done.

## 2017-11-02 NOTE — Telephone Encounter (Signed)
Veronica Frazier [facility] did call the patient and left her a message to call . I called patient and she said she had not called them back yet but she will call them to schedule her apt.

## 2017-11-04 ENCOUNTER — Other Ambulatory Visit: Payer: Self-pay | Admitting: Neurology

## 2017-11-06 ENCOUNTER — Telehealth: Payer: Self-pay | Admitting: *Deleted

## 2017-11-06 MED ORDER — LORAZEPAM 1 MG PO TABS: 1.0000 mg | ORAL_TABLET | Freq: Every day | ORAL | 5 refills | Status: DC

## 2017-11-06 NOTE — Telephone Encounter (Signed)
Lorazepam rx. faxed to [facility] [address] per faxed request from them/fim

## 2017-12-01 ENCOUNTER — Ambulatory Visit: Payer: PPO | Admitting: Neurology

## 2017-12-04 ENCOUNTER — Encounter: Payer: Self-pay | Admitting: Neurology

## 2017-12-04 ENCOUNTER — Ambulatory Visit: Payer: PPO | Admitting: Neurology

## 2017-12-04 VITALS — BP 140/87 | HR 98 | Resp 18 | Ht 67.0 in | Wt 203.5 lb

## 2017-12-04 DIAGNOSIS — F32A Depression, unspecified: Secondary | ICD-10-CM

## 2017-12-04 DIAGNOSIS — G4700 Insomnia, unspecified: Secondary | ICD-10-CM

## 2017-12-04 DIAGNOSIS — F329 Major depressive disorder, single episode, unspecified: Secondary | ICD-10-CM | POA: Diagnosis not present

## 2017-12-04 DIAGNOSIS — R269 Unspecified abnormalities of gait and mobility: Secondary | ICD-10-CM | POA: Diagnosis not present

## 2017-12-04 DIAGNOSIS — G35 Multiple sclerosis: Secondary | ICD-10-CM

## 2017-12-04 DIAGNOSIS — R5382 Chronic fatigue, unspecified: Secondary | ICD-10-CM | POA: Diagnosis not present

## 2017-12-04 MED ORDER — PHENTERMINE HCL 37.5 MG PO CAPS: ORAL_CAPSULE | ORAL | 1 refills | Status: DC

## 2017-12-04 MED ORDER — MODAFINIL 200 MG PO TABS: ORAL_TABLET | ORAL | 2 refills | Status: DC

## 2017-12-04 MED ORDER — FOLIC ACID-VIT B6-VIT B12 2.5-25-1 MG PO TABS: 1.0000 | ORAL_TABLET | Freq: Every day | ORAL | 4 refills | Status: DC

## 2017-12-04 NOTE — Progress Notes (Signed)
[facility]  PATIENT: Veronica Frazier DOB: 1966-02-22  REFERRING CLINICIAN: Harlene Ramus HISTORY FROM: patient REASON FOR VISIT: MS   HISTORICAL  CHIEF COMPLAINT:  Chief Complaint  Patient presents with  . Multiple Sclerosis    Sts. she continues to tolerate Tecfidera well.  Stopped Doxepin for sleep b/c of next day drowsiness.  Did not start Metafolbic for elevates homocysteine levels, but will/fim    HISTORY OF PRESENT ILLNESS:  Veronica Frazier is a 52 year old woman with MS and related symptoms.     Update to 02/17/2018: She feels her MS has been stable. She is on Tecfidera and tolerates it well. She denies any recent issues with flushing or GI symptoms.   She notes no change in her gait, strength or sensation since last visit. Bladder function is doing well. She has had some blurry vision but no significant change since the last visit.   The MRI of the brain 09/01/2017 did not show any acute findings. Has many T2/FLAIR hyperintense foci. A little more consistent with MS and other look more consistent with chronic microvascular ischemic change. Her homocystine was elevated so she was started on l-methylfolate. She cannot afford it as it is not covered by her insurance.  She has difficulty with fatigue and has had some daytime sleepiness.   Phentermine and Provigil have helped sleepiness.  She stopped doxepin for insomnia due to a hangover the next day.  She is using an OTC sleep aid and does better with it.    Mood is better.   She remains on Duloxetine and fluoxetine.      From 05/31/2017: MS:  As a disease modifying therapy, she is on Tecfidera. She tolerates it well. Initially she had mild GI symptoms but these no longer occur. She does not have flushing.    She is noting more blurry vision and worse STM.   Gait/strength/sensation:   Her balance and gait are stable.   She has had a few falls but usually if she trips.   She tires out quickly with  walking.  She has more trouble inside and outside her home.     She has mild right arm dysesthesia   Vision:   She notes more blurred vision that has been gradual.    Depth perception is often off.   Vision is worse if she has a headache.    Bladder:  She denies much difficulties with her bladder. Bowel is doing better (had colitis).    Pain:   Her back and hand pain has done better this year.      Mood/ Cognition:  She feels depression is mildly worse.    She is going through a divorce (18 years marriage).   She feels mood is better on duloxetine and fluoxetine.  She has cognitive difficulties, Specifically, she notes verbal fluency and short term memory.   Fatigue/sleep:  She has difficulty with fatigue and she has some daytime sleepiness. This is usually worse in the heat.   She gets benefit from Provigil (takes 1. To 1.5 pills daily) and phentermine.   She has sleep maintenance worse than sleep onset insomnia.   Lorazepam has helped her fall asleep more than stay asleep.   Flexeril causes a hangover.      MS History:   She presented in 2000  with pain and tingling in the left arm. At that time, she had an MRI of the brain performed that was concerning for multiple sclerosis. Additionally, she  had a lumbar puncture performed with CSF that was consistent with MS. Initially, she was placed on Avonex injections weekly. Initially, she did well with the Avonex but then had an MRI that showed some progression. About 2014 or 2015,  she switched to Tecfidera.     REVIEW OF SYSTEMS:  Constitutional: No fevers, chills, sweats, or change in appetite   She has fatigue Eyes: Blurry vision and rare diplopia.  No eye pain Ear, nose and throat: No hearing loss, ear pain, nasal congestion, sore throat Cardiovascular: No chest pain, palpitations Respiratory:  No shortness of breath at rest or with exertion.   No wheezes   She snores GastrointestinaI: No nausea but recent diarrhea and constipation.    Colitis  being treated Genitourinary:  No dysuria, urinary retention or frequency.  No nocturia. Musculoskeletal:  No neck pain.  Mild axial back pain.   Right arm pain Integumentary: No rash, pruritus, skin lesions Neurological: as above Psychiatric:  She reports Depression.  No anxiety Endocrine: No palpitations, diaphoresis.     Hematologic/Lymphatic:  No anemia, purpura, petechiae. Allergic/Immunologic: No itchy/runny eyes, nasal congestion, recent allergic reactions, rashes  ALLERGIES: No Known Allergies  HOME MEDICATIONS: Outpatient Medications Prior to Visit  Medication Sig Dispense Refill  . aspirin-acetaminophen-caffeine (EXCEDRIN MIGRAINE) 250-250-65 MG per tablet Take 1 tablet by mouth 3 (three) times daily as needed.      . Dimethyl Fumarate 240 MG CPDR Take 1 capsule (240 mg total) 2 (two) times daily at 10 AM and 5 PM by mouth. 180 capsule 3  . DULoxetine (CYMBALTA) 60 MG capsule TAKE ONE CAPSULE BY MOUTH ONCE DAILY 90 capsule 2  . FLUoxetine (PROZAC) 40 MG capsule TAKE 1 CAPSULE BY MOUTH ONCE DAILY 90 capsule 3  . gabapentin (NEURONTIN) 300 MG capsule Take 1 capsule (300 mg total) by mouth 4 (four) times daily. 360 capsule 3  . LORazepam (ATIVAN) 1 MG tablet Take 1 tablet (1 mg total) by mouth at bedtime. 30 tablet 5  . LOSARTAN POTASSIUM PO Take by mouth.    . meloxicam (MOBIC) 15 MG tablet TAKE ONE TABLET BY MOUTH ONCE DAILY 90 tablet 3  . Multiple Vitamins-Calcium (ONE-A-DAY WOMENS FORMULA PO) Take 1 tablet by mouth daily.      Marland Kitchen omeprazole (PRILOSEC) 20 MG capsule     . modafinil (PROVIGIL) 200 MG tablet TAKE 1/2 (ONE-HALF) TABLET BY MOUTH THREE TIMES DAILY AS NEEDED 135 tablet 5  . phentermine 37.5 MG capsule TAKE ONE CAPSULE BY MOUTH ONCE DAILY IN THE MORNING 90 capsule 1  . doxepin (SINEQUAN) 10 MG capsule Take 1 capsule (10 mg total) by mouth at bedtime. 30 capsule 11  . ondansetron (ZOFRAN ODT) 4 MG disintegrating tablet Take 1 tablet (4 mg total) by mouth every 8 (eight)  hours as needed for nausea or vomiting. (Patient not taking: Reported on 12/04/2017) 20 tablet 0  . L-Methylfolate-B12-B6-B2 (METAFOLBIC) 03-31-49-5 MG TABS Take 1 tablet daily by mouth. (Patient not taking: Reported on 12/04/2017) 90 tablet 3   No facility-administered medications prior to visit.     PAST MEDICAL HISTORY: Past Medical History:  Diagnosis Date  . Hypertension   . Multiple sclerosis (HCC)   . Neuropathy   . Stroke (HCC)   . Vision abnormalities     PAST SURGICAL HISTORY: Past Surgical History:  Procedure Laterality Date  . ABDOMINAL HYSTERECTOMY    . APPENDECTOMY    . BREAST SURGERY    . CHOLECYSTECTOMY    . TONSILLECTOMY  FAMILY HISTORY: Family History  Problem Relation Age of Onset  . Hypertension Mother   . Hyperlipidemia Mother   . Colitis Mother   . Hypertension Father     SOCIAL HISTORY:  Social History   Socioeconomic History  . Marital status: Married    Spouse name: Not on file  . Number of children: Not on file  . Years of education: Not on file  . Highest education level: Not on file  Social Needs  . Financial resource strain: Not on file  . Food insecurity - worry: Not on file  . Food insecurity - inability: Not on file  . Transportation needs - medical: Not on file  . Transportation needs - non-medical: Not on file  Occupational History  . Not on file  Tobacco Use  . Smoking status: Current Every Day Smoker  . Smokeless tobacco: Never Used  Substance and Sexual Activity  . Alcohol use: No    Alcohol/week: 0.0 oz  . Drug use: No  . Sexual activity: Not on file  Other Topics Concern  . Not on file  Social History Narrative  . Not on file     PHYSICAL EXAM  Vitals:   12/04/17 1018  BP: 140/87  Pulse: 98  Resp: 18  Weight: 203 lb 8 oz (92.3 kg)  Height: 5\' 7"  (1.702 m)    Body mass index is 31.87 kg/m.   General: The patient is well-developed and well-nourished and in no acute distress.   No rashes or edema       Neurologic Exam  Mental status: The patient is alert and oriented x 3 at the time of the examination. The patient has apparent normal recent and remote memory, with an apparently normal attention span and concentration ability.   Speech is normal.  Cranial nerves: Extraocular movements are full.   Facial strength and sensation is normal. Trapezius strength is normal..   No obvious hearing deficits are noted.  Motor:  Muscle bulk and tone are normal. Strength is  5 / 5 in all 4 extremities.   Sensory: Sensory testing is intact to touch in all 4 extremities.  Coordination: Cerebellar testing shows normal finger-nose-finger..  Gait and station: Station is normal. The gait is slightly wide and the tandem gait is moderately wide.. The Romberg is negative.  Reflexes: Deep tendon reflexes are symmetric and normal bilaterally.     DIAGNOSTIC DATA (LABS, IMAGING, TESTING) - I reviewed patient records, labs, notes, testing and imaging myself where available.  Lab Results  Component Value Date   WBC 6.0 11/01/2017   HGB 12.6 11/01/2017   HCT 38.4 11/01/2017   MCV 96.0 11/01/2017   PLT 179 11/01/2017      Component Value Date/Time   NA 137 11/01/2017 1945   K 3.5 11/01/2017 1945   CL 99 (L) 11/01/2017 1945   CO2 29 11/01/2017 1945   GLUCOSE 123 (H) 11/01/2017 1945   BUN 16 11/01/2017 1945   CREATININE 0.63 11/01/2017 1945   CALCIUM 8.6 (L) 11/01/2017 1945   PROT 6.9 11/01/2017 1945   ALBUMIN 4.0 11/01/2017 1945   AST 23 11/01/2017 1945   ALT 26 11/01/2017 1945   ALKPHOS 78 11/01/2017 1945   BILITOT 0.7 11/01/2017 1945   GFRNONAA >60 11/01/2017 1945   GFRAA >60 11/01/2017 1945       ASSESSMENT AND PLAN  Multiple sclerosis (HCC) - Plan: CBC with Differential/Platelet  Chronic fatigue  Depression, unspecified depression type  Insomnia, unspecified type  Gait disturbance    1.   Continue Tecfidera. A blood count when she had a viral syndrome shoulder lymphocytes  count of only 0.4 but her previous lymphocyte count was 1.1.   I will recheck the lymphocyte count. If 0.6 or lower. We will check again in a couple months   2.  Renew Provigil, phentermine.   Okay to take over-the-counter sleep aid. 3.    We will change the l-methylfolate to [facility] as the other is not covered by insurance and is very expensive. She will return to see me in 4 -5 months or sooner if she has new or worsening symptoms.   Elexius Minar A. Epimenio Foot, MD, PhD 12/04/2017, 10:49 AM Certified in Neurology, Clinical Neurophysiology, Sleep Medicine, Pain Medicine and Neuroimaging  [facility] [address] [place], [place] [address] [phone]

## 2017-12-05 LAB — CBC WITH DIFFERENTIAL/PLATELET
BASOS ABS: 0 10*3/uL (ref 0.0–0.2)
Basos: 0 %
EOS (ABSOLUTE): 0.1 10*3/uL (ref 0.0–0.4)
Eos: 1 %
Hematocrit: 40.4 % (ref 34.0–46.6)
Hemoglobin: 12.9 g/dL (ref 11.1–15.9)
Immature Grans (Abs): 0 10*3/uL (ref 0.0–0.1)
Immature Granulocytes: 1 %
LYMPHS ABS: 0.9 10*3/uL (ref 0.7–3.1)
Lymphs: 16 %
MCH: 30.9 pg (ref 26.6–33.0)
MCHC: 31.9 g/dL (ref 31.5–35.7)
MCV: 97 fL (ref 79–97)
MONOCYTES: 9 %
Monocytes Absolute: 0.5 10*3/uL (ref 0.1–0.9)
NEUTROS ABS: 4.3 10*3/uL (ref 1.4–7.0)
Neutrophils: 73 %
PLATELETS: 227 10*3/uL (ref 150–379)
RBC: 4.17 x10E6/uL (ref 3.77–5.28)
RDW: 14.4 % (ref 12.3–15.4)
WBC: 5.9 10*3/uL (ref 3.4–10.8)

## 2017-12-06 ENCOUNTER — Telehealth: Payer: Self-pay | Admitting: *Deleted

## 2017-12-06 NOTE — Telephone Encounter (Signed)
-----   Message from Asa Lente, MD sent at 12/05/2017  1:15 PM EST ----- Please let the patient know that the lab work is fine.   The lymphocytes are back into the normal range

## 2017-12-06 NOTE — Telephone Encounter (Signed)
Spoke with [name] and reviewed below lab results.  She verbalized understanding of same/fim

## 2017-12-28 ENCOUNTER — Other Ambulatory Visit: Payer: Self-pay | Admitting: Neurology

## 2017-12-30 ENCOUNTER — Other Ambulatory Visit: Payer: Self-pay

## 2017-12-30 ENCOUNTER — Encounter (HOSPITAL_BASED_OUTPATIENT_CLINIC_OR_DEPARTMENT_OTHER): Payer: Self-pay | Admitting: Emergency Medicine

## 2017-12-30 ENCOUNTER — Emergency Department (HOSPITAL_BASED_OUTPATIENT_CLINIC_OR_DEPARTMENT_OTHER)
Admission: EM | Admit: 2017-12-30 | Discharge: 2017-12-31 | Disposition: A | Discharge status: Home / Self Care | Payer: PPO | Attending: Emergency Medicine | Admitting: Emergency Medicine

## 2017-12-30 DIAGNOSIS — R112 Nausea with vomiting, unspecified: Secondary | ICD-10-CM | POA: Diagnosis not present

## 2017-12-30 DIAGNOSIS — R197 Diarrhea, unspecified: Secondary | ICD-10-CM | POA: Insufficient documentation

## 2017-12-30 DIAGNOSIS — F17200 Nicotine dependence, unspecified, uncomplicated: Secondary | ICD-10-CM | POA: Diagnosis not present

## 2017-12-30 DIAGNOSIS — Z79899 Other long term (current) drug therapy: Secondary | ICD-10-CM | POA: Insufficient documentation

## 2017-12-30 DIAGNOSIS — R109 Unspecified abdominal pain: Secondary | ICD-10-CM | POA: Diagnosis not present

## 2017-12-30 DIAGNOSIS — R1110 Vomiting, unspecified: Secondary | ICD-10-CM | POA: Diagnosis present

## 2017-12-30 DIAGNOSIS — I10 Essential (primary) hypertension: Secondary | ICD-10-CM | POA: Diagnosis not present

## 2017-12-30 LAB — COMPREHENSIVE METABOLIC PANEL
ALBUMIN: 4.2 g/dL (ref 3.5–5.0)
ALK PHOS: 88 U/L (ref 38–126)
ALT: 31 U/L (ref 14–54)
AST: 23 U/L (ref 15–41)
Anion gap: 10 (ref 5–15)
BILIRUBIN TOTAL: 0.9 mg/dL (ref 0.3–1.2)
BUN: 18 mg/dL (ref 6–20)
CALCIUM: 9.2 mg/dL (ref 8.9–10.3)
CO2: 27 mmol/L (ref 22–32)
CREATININE: 0.72 mg/dL (ref 0.44–1.00)
Chloride: 99 mmol/L — ABNORMAL LOW (ref 101–111)
GFR calc Af Amer: >60 mL/min (ref >60)
GLUCOSE: 146 mg/dL — AB (ref 65–99)
POTASSIUM: 3.4 mmol/L — AB (ref 3.5–5.1)
Sodium: 136 mmol/L (ref 135–145)
Total Protein: 7.7 g/dL (ref 6.5–8.1)

## 2017-12-30 LAB — CBC WITH DIFFERENTIAL/PLATELET
BASOS ABS: 0 10*3/uL (ref 0.0–0.1)
Basophils Relative: 0 %
Eosinophils Absolute: 0 10*3/uL (ref 0.0–0.7)
Eosinophils Relative: 1 %
HEMATOCRIT: 44.2 % (ref 36.0–46.0)
Hemoglobin: 14.7 g/dL (ref 12.0–15.0)
LYMPHS PCT: 7 %
Lymphs Abs: 0.4 10*3/uL — ABNORMAL LOW (ref 0.7–4.0)
MCH: 31.5 pg (ref 26.0–34.0)
MCHC: 33.3 g/dL (ref 30.0–36.0)
MCV: 94.6 fL (ref 78.0–100.0)
MONO ABS: 0.5 10*3/uL (ref 0.1–1.0)
Monocytes Relative: 8 %
NEUTROS ABS: 5.3 10*3/uL (ref 1.7–7.7)
Neutrophils Relative %: 84 %
Platelets: 222 10*3/uL (ref 150–400)
RBC: 4.67 MIL/uL (ref 3.87–5.11)
RDW: 13.6 % (ref 11.5–15.5)
WBC: 6.3 10*3/uL (ref 4.0–10.5)

## 2017-12-30 LAB — LIPASE, BLOOD: LIPASE: 19 U/L (ref 11–51)

## 2017-12-30 MED ORDER — SODIUM CHLORIDE 0.9 % IV SOLN
INTRAVENOUS | Status: DC
  Administered 2017-12-31: 01:00:00 via INTRAVENOUS

## 2017-12-30 MED ORDER — ONDANSETRON HCL 4 MG/2ML IJ SOLN
4.0000 mg | Freq: Once | INTRAMUSCULAR | Status: AC
  Administered 2017-12-30: 4 mg via INTRAVENOUS
  Filled 2017-12-30: qty 2

## 2017-12-30 MED ORDER — SODIUM CHLORIDE 0.9 % IV BOLUS (SEPSIS)
1000.0000 mL | Freq: Once | INTRAVENOUS | Status: AC
  Administered 2017-12-30: 1000 mL via INTRAVENOUS

## 2017-12-30 MED ORDER — SODIUM CHLORIDE 0.9 % IV BOLUS (SEPSIS)
1000.0000 mL | Freq: Once | INTRAVENOUS | Status: AC
  Administered 2017-12-31: 1000 mL via INTRAVENOUS

## 2017-12-30 NOTE — ED Triage Notes (Signed)
Patient states that she has had N/V/ x 2 -3 days. Patient reports that she is having abdominal cramping as well

## 2017-12-30 NOTE — ED Provider Notes (Signed)
[facility] EMERGENCY DEPARTMENT Provider Note   CSN: [id number] Arrival date & time: 12/30/17  2246     History   Chief Complaint Chief Complaint  Patient presents with  . Emesis    HPI Veronica Frazier is a 52 y.o. female.  HPI Pt started with vomit and diarrhea 3 days ago.  The first day she had diarrhea but that has stopped for the last couple of days.  She has continued to vomit.  She had two episodes this am.  None since but she is dry heaving.  She is having pain the periumbilical region and epigastric region.  It feels like she is getting kicked. No fevers but she has felt chilled.   She has been trying to keep down fluids.  She also tried phenergan.   Past Medical History:  Diagnosis Date  . Hypertension   . Multiple sclerosis (HCC)   . Neuropathy   . Stroke (HCC)   . Vision abnormalities     Patient Active Problem List   Diagnosis Date Noted  . White matter abnormality on MRI of brain 09/11/2017  . Gait disturbance 10/13/2015  . Disturbed cognition 04/09/2015  . Right arm pain 04/09/2015  . Multiple sclerosis, relapsing-remitting (HCC) 12/09/2014  . Chronic fatigue 12/09/2014  . Depression 12/09/2014  . Insomnia 12/09/2014  . Visual disturbance 12/09/2014  . 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 Essential (primary) hypertension 09/03/2014  . HLD (hyperlipidemia) 04/21/2014  . Encounter for general adult medical examination without abnormal findings 04/18/2014  . Multiple sclerosis (HCC) 04/06/2013    Past Surgical History:  Procedure Laterality Date  . ABDOMINAL HYSTERECTOMY    . APPENDECTOMY    . BREAST SURGERY    . CHOLECYSTECTOMY    . TONSILLECTOMY      OB History    No data available       Home Medications    Prior to Admission medications   Medication Sig Start Date End Date Taking? Authorizing Provider  aspirin-acetaminophen-caffeine (EXCEDRIN MIGRAINE) [phone] MG per tablet Take 1  tablet by mouth 3 (three) times daily as needed.      [provider]  Dimethyl Fumarate 240 MG CPDR Take 1 capsule (240 mg total) 2 (two) times daily at 10 AM and 5 PM by mouth. 09/05/17   Sater, Pearletha Furl, MD  doxepin (SINEQUAN) 10 MG capsule Take 1 capsule (10 mg total) by mouth at bedtime. 05/31/17   Sater, Pearletha Furl, MD  DULoxetine (CYMBALTA) 60 MG capsule TAKE 1 CAPSULE BY MOUTH ONCE DAILY 12/29/17   Sater, Pearletha Furl, MD  FLUoxetine (PROZAC) 40 MG capsule TAKE 1 CAPSULE BY MOUTH ONCE DAILY 10/03/17   Sater, Pearletha Furl, MD  Folic Acid-Vit B6-Vit B12 (FOLBEE) 2.5-25-1 MG TABS tablet Take 1 tablet by mouth daily. 12/04/17   Sater, Pearletha Furl, MD  gabapentin (NEURONTIN) 300 MG capsule Take 1 capsule (300 mg total) by mouth 4 (four) times daily. 05/31/17   Sater, Pearletha Furl, MD  LORazepam (ATIVAN) 1 MG tablet Take 1 tablet (1 mg total) by mouth at bedtime. 11/06/17   Sater, Pearletha Furl, MD  LOSARTAN POTASSIUM PO Take by mouth.    [provider]  meloxicam (MOBIC) 15 MG tablet TAKE 1 TABLET BY MOUTH ONCE DAILY 12/29/17   Sater, Pearletha Furl, MD  modafinil (PROVIGIL) 200 MG tablet Take one half tablet 2 -3 times a day 12/04/17   Sater, Pearletha Furl, MD  Multiple Vitamins-Calcium (ONE-A-DAY WOMENS  FORMULA PO) Take 1 tablet by mouth daily.      [provider]  omeprazole (PRILOSEC) 20 MG capsule  03/02/15   [provider]  ondansetron (ZOFRAN ODT) 4 MG disintegrating tablet Take 1 tablet (4 mg total) by mouth every 8 (eight) hours as needed for nausea or vomiting. Patient not taking: Reported on 12/04/2017 11/01/17   Robinson, [place] N, PA-C  phentermine 37.5 MG capsule TAKE ONE CAPSULE BY MOUTH ONCE DAILY IN THE MORNING 12/04/17   Sater, Pearletha Furl, MD  promethazine (PHENERGAN) 25 MG tablet Take 1 tablet (25 mg total) by mouth every 6 (six) hours as needed for nausea or vomiting. 12/31/17   Linwood Dibbles, MD    Family History Family History  Problem Relation Age of Onset  . Hypertension Mother   .  Hyperlipidemia Mother   . Colitis Mother   . Hypertension Father     Social History Social History   Tobacco Use  . Smoking status: Current Every Day Smoker  . Smokeless tobacco: Never Used  Substance Use Topics  . Alcohol use: No    Alcohol/week: 0.0 oz  . Drug use: No     Allergies   Patient has no known allergies.   Review of Systems Review of Systems  All other systems reviewed and are negative.    Physical Exam Updated Vital Signs BP (!) 148/88 (BP Location: Left Arm)   Pulse 78   Temp 98.2 F (36.8 C) (Oral)   Resp 18   Ht 1.702 m (5\' 7" )   Wt 85.7 kg (189 lb)   SpO2 97%   BMI 29.60 kg/m   Physical Exam  Constitutional: She appears well-developed and well-nourished. No distress.  HENT:  Head: Normocephalic and atraumatic.  Right Ear: External ear normal.  Left Ear: External ear normal.  Eyes: Conjunctivae are normal. Right eye exhibits no discharge. Left eye exhibits no discharge. No scleral icterus.  Neck: Neck supple. No tracheal deviation present.  Cardiovascular: Normal rate, regular rhythm and intact distal pulses.  Pulmonary/Chest: Effort normal and breath sounds normal. No stridor. No respiratory distress. She has no wheezes. She has no rales.  Abdominal: Soft. Bowel sounds are normal. She exhibits no distension. There is no tenderness. There is no rebound and no guarding.  Musculoskeletal: She exhibits no edema or tenderness.  Neurological: She is alert. She has normal strength. No cranial nerve deficit (no facial droop, extraocular movements intact, no slurred speech) or sensory deficit. She exhibits normal muscle tone. She displays no seizure activity. Coordination normal.  Skin: Skin is warm and dry. No rash noted.  Psychiatric: She has a normal mood and affect.  Nursing note and vitals reviewed.    ED Treatments / Results  Labs (all labs ordered are listed, but only abnormal results are displayed) Labs Reviewed  COMPREHENSIVE METABOLIC  PANEL - Abnormal; Notable for the following components:      Result Value   Potassium 3.4 (*)    Chloride 99 (*)    Glucose, Bld 146 (*)    All other components within normal limits  CBC WITH DIFFERENTIAL/PLATELET - Abnormal; Notable for the following components:   Lymphs Abs 0.4 (*)    All other components within normal limits  LIPASE, BLOOD  URINALYSIS, ROUTINE W REFLEX MICROSCOPIC     Radiology Dg Abdomen Acute W/chest  Result Date: 12/31/2017 CLINICAL DATA:  Abdominal pain and vomiting. EXAM: DG ABDOMEN ACUTE W/ 1V CHEST COMPARISON:  Chest radiograph 09/24/2015 FINDINGS: Previous right  upper lobe opacity has resolved. Mild cardiomegaly which appears similar to prior. No new airspace disease. No evidence of pleural effusion. No free intra-abdominal air. Air-fluid level in the stomach. Moderate colonic stool burden throughout the entire colon. Few prominent air-filled small bowel loops in the central abdomen with air-fluid level. Cholecystectomy clips in the right upper quadrant. No radiopaque calculi. No acute osseous abnormalities are seen. IMPRESSION: 1. Air-fluid levels within scattered prominent central small bowel in the stomach, nonspecific. This may reflect enteritis or ileus. Early small bowel obstruction is considered but felt less likely. 2. Moderate colonic stool burden. 3. Mild chronic cardiomegaly without acute chest process. Electronically Signed   By: Rubye Oaks M.D.   On: 12/31/2017 00:50    Procedures Procedures (including critical care time)  Medications Ordered in ED Medications  sodium chloride 0.9 % bolus 1,000 mL (0 mLs Intravenous Stopped 12/31/17 0017)    And  sodium chloride 0.9 % bolus 1,000 mL (0 mLs Intravenous Stopped 12/31/17 0118)    And  0.9 %  sodium chloride infusion ( Intravenous New Bag/Given 12/31/17 0118)  ondansetron (ZOFRAN) injection 4 mg (4 mg Intravenous Given 12/30/17 2336)  ondansetron (ZOFRAN) injection 4 mg (4 mg Intravenous Given 12/31/17  0048)  promethazine (PHENERGAN) injection 12.5 mg (12.5 mg Intravenous Given 12/31/17 0140)     Initial Impression / Assessment and Plan / ED Course  I have reviewed the triage vital signs and the nursing notes.  Pertinent labs & imaging results that were available during my care of the patient were reviewed by me and considered in my medical decision making (see chart for details).  Clinical Course as of Dec 31 208  Wynelle Link Dec 31, 2017  0125 No vomiting in the ED but nausea persists.  Phenergan ordered  [JK]  0209 Labs are reassuring.  No significant abnormalities.  X-rays suggest ileus more likely than early small bowel obstruction.  [JK]    Clinical Course User Index [JK] Linwood Dibbles, MD    Patient presented to the emergency room with complaints of persistent nausea and dry heaving without any vomiting today.  Patient's laboratory tests are reassuring.  X-rays do not show a definite bowel obstruction.  I suspect more ileus and gastroenteritis rather than bowel obstruction that she has not actually had any vomiting today.  Plan on discharge home with antiemetics.  Discussed close follow-up and indications to return to the emergency room.  Final Clinical Impressions(s) / ED Diagnoses   Final diagnoses:  Nausea vomiting and diarrhea    ED Discharge Orders        Ordered    promethazine (PHENERGAN) 25 MG tablet  Every 6 hours PRN     12/31/17 0209       Linwood Dibbles, MD 12/31/17 0211

## 2017-12-31 ENCOUNTER — Emergency Department (HOSPITAL_BASED_OUTPATIENT_CLINIC_OR_DEPARTMENT_OTHER): Payer: PPO

## 2017-12-31 DIAGNOSIS — R109 Unspecified abdominal pain: Secondary | ICD-10-CM | POA: Diagnosis not present

## 2017-12-31 LAB — URINALYSIS, ROUTINE W REFLEX MICROSCOPIC
Bilirubin Urine: NEGATIVE
GLUCOSE, UA: NEGATIVE mg/dL
HGB URINE DIPSTICK: NEGATIVE
KETONES UR: NEGATIVE mg/dL
Leukocytes, UA: NEGATIVE
Nitrite: NEGATIVE
PH: 6.5 (ref 5.0–8.0)
Protein, ur: NEGATIVE mg/dL
Specific Gravity, Urine: 1.015 (ref 1.005–1.030)

## 2017-12-31 MED ORDER — PROMETHAZINE HCL 25 MG PO TABS: 25.0000 mg | ORAL_TABLET | Freq: Four times a day (QID) | ORAL | 0 refills | Status: DC | PRN

## 2017-12-31 MED ORDER — PROMETHAZINE HCL 25 MG/ML IJ SOLN
12.5000 mg | Freq: Once | INTRAMUSCULAR | Status: AC
  Administered 2017-12-31: 12.5 mg via INTRAVENOUS
  Filled 2017-12-31: qty 1

## 2017-12-31 MED ORDER — ONDANSETRON HCL 4 MG/2ML IJ SOLN
4.0000 mg | Freq: Once | INTRAMUSCULAR | Status: AC
  Administered 2017-12-31: 4 mg via INTRAVENOUS
  Filled 2017-12-31: qty 2

## 2017-12-31 NOTE — ED Notes (Signed)
Pt c/o mouth being dry. Given swab, ice chips and mouth lubricant.

## 2017-12-31 NOTE — ED Notes (Signed)
Given ice chips.

## 2017-12-31 NOTE — Discharge Instructions (Signed)
Take medications as prescribed, you can also try taking an over-the-counter antacid such as Pepcid complete, follow-up with your doctor next week if the symptoms have not resolved

## 2017-12-31 NOTE — ED Notes (Signed)
ED Provider at bedside. 

## 2017-12-31 NOTE — ED Notes (Signed)
Patient transported to X-ray and returned 

## 2017-12-31 NOTE — ED Notes (Signed)
Pt and FM given d/c instructions as per chart. Rx x1. Verbalizes understanding. No questions. 

## 2018-01-01 ENCOUNTER — Telehealth: Payer: Self-pay | Admitting: Neurology

## 2018-01-01 NOTE — Telephone Encounter (Signed)
Spoke with pharmacist and advised pt. is on both Prozac and Cymbalta/fim

## 2018-01-01 NOTE — Telephone Encounter (Signed)
Merla Riches with [facility] calling to see if  DULoxetine (CYMBALTA) 60 MG capsule and FLUoxetine (PROZAC) 40 MG capsule should be filled.

## 2018-01-10 ENCOUNTER — Other Ambulatory Visit: Payer: Self-pay | Admitting: Neurology

## 2018-01-11 ENCOUNTER — Telehealth: Payer: Self-pay | Admitting: *Deleted

## 2018-01-11 NOTE — Telephone Encounter (Signed)
PA for Tecfidera 240mg  capsules,#60/30 completed and faxed to [facility], fax# [phone].  Dx: RRMS (G35).  Tried and failed meds: Avonex (relapse)/fim

## 2018-01-12 NOTE — Telephone Encounter (Signed)
Tecfidera approved by [facility] [phone]).  Valid through 01/12/2019.  Member YD#[id number].

## 2018-01-22 ENCOUNTER — Telehealth: Payer: Self-pay | Admitting: Neurology

## 2018-01-22 NOTE — Telephone Encounter (Signed)
Pt said she does not remember getting hard copy for phentermine 37.5 MG capsule at last OV, she said if she did she lost it. Pt also needs refill for gabapentin (NEURONTIN) 300 MG capsule  Please send to [facility]. Please call if pt needs to pick up rx for phentermine.

## 2018-01-22 NOTE — Telephone Encounter (Signed)
Spoke with [name].  Gabapentin r/f are available at [facility] can transfer those refills.  Phentermine r/f should be available at [facility] day rx. sent in in Feb/fim

## 2018-03-19 ENCOUNTER — Other Ambulatory Visit: Payer: Self-pay | Admitting: Neurology

## 2018-03-19 ENCOUNTER — Telehealth: Payer: Self-pay | Admitting: Neurology

## 2018-03-19 MED ORDER — METHYLPREDNISOLONE 4 MG PO TBPK: ORAL_TABLET | ORAL | 0 refills | Status: DC

## 2018-03-19 NOTE — Telephone Encounter (Signed)
prescription called in.

## 2018-03-19 NOTE — Telephone Encounter (Signed)
Pt has called asking for a call back to know if she can come in for an injection due to a head ache she has had for about 2 weeks. Please call

## 2018-03-19 NOTE — Telephone Encounter (Signed)
Thank you :)

## 2018-03-19 NOTE — Telephone Encounter (Signed)
Patient reports that she has had an ongoing headache for the last 2 weeks. She thought it was allergies at first so she was taking Claritin and Mucinex with no relief. She has also been using Excedrin migraine and says that it helps temporarily, but the headache comes right back. She denies any other symptoms, says it's not as severe as a migraine. I will discuss with Dr. Epimenio Foot.

## 2018-03-19 NOTE — Telephone Encounter (Signed)
I spoke with Dr. Epimenio Foot and he recommends either doing a nerve block tomorrow OR trying a steroid taper followed by meloxicam 15mg  daily. I called and spoke with patient and because she's not to the point of it being as severe as a migraine, she would like to try the steroid pack first. Pharmacy confirmed. Dr. Epimenio Foot, would you mind placing those orders and sending to pharmacy?

## 2018-04-27 DIAGNOSIS — M25572 Pain in left ankle and joints of left foot: Secondary | ICD-10-CM | POA: Diagnosis not present

## 2018-04-27 DIAGNOSIS — Z0184 Encounter for antibody response examination: Secondary | ICD-10-CM | POA: Diagnosis not present

## 2018-05-08 ENCOUNTER — Other Ambulatory Visit: Payer: Self-pay | Admitting: Neurology

## 2018-05-09 NOTE — Telephone Encounter (Signed)
Rx. awaiting RAS sig/fim 

## 2018-06-06 ENCOUNTER — Other Ambulatory Visit: Payer: Self-pay | Admitting: Neurology

## 2018-06-06 ENCOUNTER — Telehealth: Payer: Self-pay | Admitting: Neurology

## 2018-06-06 ENCOUNTER — Ambulatory Visit: Payer: PPO | Admitting: Neurology

## 2018-06-06 ENCOUNTER — Encounter: Payer: Self-pay | Admitting: Neurology

## 2018-06-06 ENCOUNTER — Other Ambulatory Visit: Payer: Self-pay

## 2018-06-06 VITALS — Resp 18 | Ht 67.0 in | Wt 203.0 lb

## 2018-06-06 DIAGNOSIS — G35 Multiple sclerosis: Secondary | ICD-10-CM | POA: Diagnosis not present

## 2018-06-06 DIAGNOSIS — G4700 Insomnia, unspecified: Secondary | ICD-10-CM

## 2018-06-06 DIAGNOSIS — F329 Major depressive disorder, single episode, unspecified: Secondary | ICD-10-CM

## 2018-06-06 DIAGNOSIS — R269 Unspecified abnormalities of gait and mobility: Secondary | ICD-10-CM | POA: Diagnosis not present

## 2018-06-06 DIAGNOSIS — I10 Essential (primary) hypertension: Secondary | ICD-10-CM

## 2018-06-06 DIAGNOSIS — R5382 Chronic fatigue, unspecified: Secondary | ICD-10-CM

## 2018-06-06 DIAGNOSIS — F32A Depression, unspecified: Secondary | ICD-10-CM

## 2018-06-06 MED ORDER — AMPHETAMINE-DEXTROAMPHETAMINE 10 MG PO TABS: 10.0000 mg | ORAL_TABLET | Freq: Every day | ORAL | 0 refills | Status: DC

## 2018-06-06 MED ORDER — CYCLOBENZAPRINE HCL 5 MG PO TABS: 5.0000 mg | ORAL_TABLET | Freq: Every day | ORAL | 5 refills | Status: DC

## 2018-06-06 MED ORDER — AMPHETAMINE-DEXTROAMPHETAMINE 10 MG PO TABS: ORAL_TABLET | ORAL | 0 refills | Status: DC

## 2018-06-06 NOTE — Progress Notes (Addendum)
[facility]  PATIENT: Veronica Frazier DOB: December 21, 1965  REFERRING CLINICIAN: Harlene Ramus HISTORY FROM: patient REASON FOR VISIT: MS   HISTORICAL  CHIEF COMPLAINT:  Chief Complaint  Patient presents with  . Multiple Sclerosis    Sts. she continues to tolerate Tecfidera well.  Sts.h/a's are more frequent--about twice per day.  She is taking 2-4 Excedrin Migraines per day.  Sts. fatigue is worse despite taking Phentermine and Provigil.  She increased Provigil 200mg  to 1 and 1/2 tabs daily.  Also c/o difficulty staying asleep despite Lorazepam.  Was not able to tolerate Doxepin due to next day drowsiness/fim    HISTORY OF PRESENT ILLNESS:  Veronica Frazier is a 52 year old woman with MS and related symptoms.    Update 06/06/2018: She has not noted any exacerbations and feels her MS is mostly stable.  She is on Tecfidera 240 mg twice daily and tolerates it fairly well.  Last MRI of her brain, 09/01/2017, did not show any new findings.  Many foci are nonspecific, so she appears to have a mixture of both demyelinating and chronic microvascular ischemic change foci  She feels her gait is worse due to left ankle swelling.   She wears a brace.   Pain is throbbing there.     She has more difficulties with fatigue even when she is taking phentermine and Provigil.  She currently takes Provigil 300 mg daily.  Im the past, IV steroid helped some.   She notes insomnia and has difficulty staying asleep more than falling asleep. She often is awake in the middle of the night.    She takes lorazepam.  Doxepin caused her to be drowsy the next day.   She has had some difficulties with depression and anxiety but feels this is stable.  She is on duloxetine and fluoxetine.   She feels her mood is stable.    Update to 02/17/2018: She feels her MS has been stable. She is on Tecfidera and tolerates it well. She denies any recent issues with flushing or GI symptoms.   She notes no change  in her gait, strength or sensation since last visit. Bladder function is doing well. She has had some blurry vision but no significant change since the last visit.   The MRI of the brain 09/01/2017 did not show any acute findings. Has many T2/FLAIR hyperintense foci. A little more consistent with MS and other look more consistent with chronic microvascular ischemic change. Her homocystine was elevated so she was started on l-methylfolate. She cannot afford it as it is not covered by her insurance.  She has difficulty with fatigue and has had some daytime sleepiness.   Phentermine and Provigil have helped sleepiness.  She stopped doxepin for insomnia due to a hangover the next day.  She is using an OTC sleep aid and does better with it.    Mood is better.   She remains on Duloxetine and fluoxetine.      From 05/31/2017: MS:  As a disease modifying therapy, she is on Tecfidera. She tolerates it well. Initially she had mild GI symptoms but these no longer occur. She does not have flushing.    She is noting more blurry vision and worse STM.   Gait/strength/sensation:   Her balance and gait are stable.   She has had a few falls but usually if she trips.   She tires out quickly with walking.  She has more trouble inside and outside her home.  She has mild right arm dysesthesia   Vision:   She notes more blurred vision that has been gradual.    Depth perception is often off.   Vision is worse if she has a headache.    Bladder:  She denies much difficulties with her bladder. Bowel is doing better (had colitis).    Pain:   Her back and hand pain has done better this year.      Mood/ Cognition:  She feels depression is mildly worse.    She is going through a divorce (18 years marriage).   She feels mood is better on duloxetine and fluoxetine.  She has cognitive difficulties, Specifically, she notes verbal fluency and short term memory.   Fatigue/sleep:  She has difficulty with fatigue and she has some  daytime sleepiness. This is usually worse in the heat.   She gets benefit from Provigil (takes 1. To 1.5 pills daily) and phentermine.   She has sleep maintenance worse than sleep onset insomnia.   Lorazepam has helped her fall asleep more than stay asleep.   Flexeril causes a hangover.      MS History:   She presented in 2000  with pain and tingling in the left arm. At that time, she had an MRI of the brain performed that was concerning for multiple sclerosis. Additionally, she had a lumbar puncture performed with CSF that was consistent with MS. Initially, she was placed on Avonex injections weekly. Initially, she did well with the Avonex but then had an MRI that showed some progression. About 2014 or 2015,  she switched to Tecfidera.     REVIEW OF SYSTEMS:  Constitutional: No fevers, chills, sweats, or change in appetite   She has fatigue that has worsened Eyes: Blurry vision and rare diplopia.  No eye pain Ear, nose and throat: No hearing loss, ear pain, nasal congestion, sore throat Cardiovascular: No chest pain, palpitations Respiratory:  No shortness of breath at rest or with exertion.   No wheezes   She snores GastrointestinaI: No nausea but recent diarrhea and constipation.    Colitis being treated Genitourinary:  No dysuria, urinary retention or frequency.  No nocturia. Musculoskeletal:  No neck pain.  Mild axial back pain.   Right arm pain Integumentary: No rash, pruritus, skin lesions Neurological: as above Psychiatric:  She has some depression Endocrine: No palpitations, diaphoresis.     Hematologic/Lymphatic:  No anemia, purpura, petechiae. Allergic/Immunologic: No itchy/runny eyes, nasal congestion, recent allergic reactions, rashes  ALLERGIES: No Known Allergies  HOME MEDICATIONS: Outpatient Medications Prior to Visit  Medication Sig Dispense Refill  . aspirin-acetaminophen-caffeine (EXCEDRIN MIGRAINE) 250-250-65 MG per tablet Take 1 tablet by mouth 3 (three) times daily as  needed.      . Dimethyl Fumarate 240 MG CPDR Take 1 capsule (240 mg total) 2 (two) times daily at 10 AM and 5 PM by mouth. 180 capsule 3  . DULoxetine (CYMBALTA) 60 MG capsule TAKE 1 CAPSULE BY MOUTH ONCE DAILY 90 capsule 2  . FLUoxetine (PROZAC) 40 MG capsule TAKE 1 CAPSULE BY MOUTH ONCE DAILY 90 capsule 3  . Folic Acid-Vit B6-Vit B12 (FOLBEE) 2.5-25-1 MG TABS tablet Take 1 tablet by mouth daily. 90 tablet 4  . gabapentin (NEURONTIN) 300 MG capsule Take 1 capsule (300 mg total) by mouth 4 (four) times daily. 360 capsule 3  . LORazepam (ATIVAN) 1 MG tablet TAKE 1 TABLET BY MOUTH ONCE DAILY AT BEDTIME 30 tablet 5  . LOSARTAN POTASSIUM PO Take by  mouth.    . meloxicam (MOBIC) 15 MG tablet TAKE 1 TABLET BY MOUTH ONCE DAILY 90 tablet 3  . modafinil (PROVIGIL) 200 MG tablet Take one half tablet 2 -3 times a day 135 tablet 2  . Multiple Vitamins-Calcium (ONE-A-DAY WOMENS FORMULA PO) Take 1 tablet by mouth daily.      Marland Kitchen omeprazole (PRILOSEC) 20 MG capsule     . doxepin (SINEQUAN) 10 MG capsule Take 1 capsule (10 mg total) by mouth at bedtime. 30 capsule 11  . phentermine 37.5 MG capsule TAKE ONE CAPSULE BY MOUTH ONCE DAILY IN THE MORNING    . atorvastatin (LIPITOR) 40 MG tablet TAKE 1 TABLET BY MOUTH ONCE DAILY AT NIGHT  3  . methylPREDNISolone (MEDROL DOSEPAK) 4 MG TBPK tablet Taper over 6 days, starting with 6 pills po the first day 21 tablet 0  . ondansetron (ZOFRAN ODT) 4 MG disintegrating tablet Take 1 tablet (4 mg total) by mouth every 8 (eight) hours as needed for nausea or vomiting. (Patient not taking: Reported on 12/04/2017) 20 tablet 0  . phentermine 37.5 MG capsule TAKE ONE CAPSULE BY MOUTH ONCE DAILY IN THE MORNING 90 capsule 1  . promethazine (PHENERGAN) 25 MG tablet Take 1 tablet (25 mg total) by mouth every 6 (six) hours as needed for nausea or vomiting. 12 tablet 0   No facility-administered medications prior to visit.     PAST MEDICAL HISTORY: Past Medical History:  Diagnosis Date    . Hypertension   . Multiple sclerosis (HCC)   . Neuropathy   . Stroke (HCC)   . Vision abnormalities     PAST SURGICAL HISTORY: Past Surgical History:  Procedure Laterality Date  . ABDOMINAL HYSTERECTOMY    . APPENDECTOMY    . BREAST SURGERY    . CHOLECYSTECTOMY    . TONSILLECTOMY      FAMILY HISTORY: Family History  Problem Relation Age of Onset  . Hypertension Mother   . Hyperlipidemia Mother   . Colitis Mother   . Hypertension Father     SOCIAL HISTORY:  Social History   Socioeconomic History  . Marital status: Married    Spouse name: Not on file  . Number of children: Not on file  . Years of education: Not on file  . Highest education level: Not on file  Occupational History  . Not on file  Social Needs  . Financial resource strain: Not on file  . Food insecurity:    Worry: Not on file    Inability: Not on file  . Transportation needs:    Medical: Not on file    Non-medical: Not on file  Tobacco Use  . Smoking status: Current Every Day Smoker  . Smokeless tobacco: Never Used  Substance and Sexual Activity  . Alcohol use: No    Alcohol/week: 0.0 oz  . Drug use: No  . Sexual activity: Not on file  Lifestyle  . Physical activity:    Days per week: Not on file    Minutes per session: Not on file  . Stress: Not on file  Relationships  . Social connections:    Talks on phone: Not on file    Gets together: Not on file    Attends religious service: Not on file    Active member of club or organization: Not on file    Attends meetings of clubs or organizations: Not on file    Relationship status: Not on file  . Intimate partner violence:  Fear of current or ex partner: Not on file    Emotionally abused: Not on file    Physically abused: Not on file    Forced sexual activity: Not on file  Other Topics Concern  . Not on file  Social History Narrative  . Not on file     PHYSICAL EXAM  Vitals:   06/06/18 1000  Resp: 18  Weight: 203 lb  (92.1 kg)  Height: 5\' 7"  (1.702 m)    Body mass index is 31.79 kg/m.   General: The patient is well-developed and well-nourished and in no acute distress.   No rashes or edema     Neurologic Exam  Mental status: The patient is alert and oriented x 3 at the time of the examination. The patient has apparent normal recent and remote memory, with an apparently normal attention span and concentration ability.   Speech is normal.  Cranial nerves: Extraocular movements are full.   Facial strength and sensation was normal.  Trapezius strength was normal. Motor:  Muscle bulk and tone are normal.  Strength was 5/5 in the limbs  Sensory: Sensory testing is intact to touch in all 4 extremities.  Coordination: Cerebellar testing shows normal finger-nose-finger..  Gait and station: Station is normal.  Gait is slightly wide and tandem gait is wide. The Romberg is negative.  Reflexes: Deep tendon reflexes are symmetric and normal bilaterally.     DIAGNOSTIC DATA (LABS, IMAGING, TESTING) - I reviewed patient records, labs, notes, testing and imaging myself where available.  Lab Results  Component Value Date   WBC 6.3 12/30/2017   HGB 14.7 12/30/2017   HCT 44.2 12/30/2017   MCV 94.6 12/30/2017   PLT 222 12/30/2017      Component Value Date/Time   NA 136 12/30/2017 2327   K 3.4 (L) 12/30/2017 2327   CL 99 (L) 12/30/2017 2327   CO2 27 12/30/2017 2327   GLUCOSE 146 (H) 12/30/2017 2327   BUN 18 12/30/2017 2327   CREATININE 0.72 12/30/2017 2327   CALCIUM 9.2 12/30/2017 2327   PROT 7.7 12/30/2017 2327   ALBUMIN 4.2 12/30/2017 2327   AST 23 12/30/2017 2327   ALT 31 12/30/2017 2327   ALKPHOS 88 12/30/2017 2327   BILITOT 0.9 12/30/2017 2327   GFRNONAA >60 12/30/2017 2327   GFRAA >60 12/30/2017 2327       ASSESSMENT AND PLAN  Multiple sclerosis (HCC)  Chronic fatigue  Depression, unspecified depression type  Gait disturbance  Insomnia, unspecified type  Essential  (primary) hypertension    1.   Continue Tecfidera. Check CBC --- last lymphocytes were low.  If this one is also low, consider change to Aubagio.  2.   Renew Provigil.  Stop phentermine, add Adderall 20 mg po bid.   Cyclobenzaprine 5 mg nightly 3.   IV Solumedrol one gram. 4.   She will return to see me in 4 -5 months or sooner if she has new or worsening symptoms.   Ara Grandmaison A. Epimenio Foot, MD, PhD 06/06/2018, 10:38 AM Certified in Neurology, Clinical Neurophysiology, Sleep Medicine, Pain Medicine and Neuroimaging  [facility] [address], [address] [place], [place] [address] [phone]

## 2018-06-06 NOTE — Telephone Encounter (Signed)
Veronica Frazier with [facility] needs to clarify Rx amphetamine-dextroamphetamine (ADDERALL) 10 MG tablet that was just sent in.

## 2018-06-06 NOTE — Addendum Note (Signed)
Addended by: Candis Schatz I on: 06/06/2018 10:50 AM   Modules accepted: Orders

## 2018-06-07 ENCOUNTER — Telehealth: Payer: Self-pay | Admitting: *Deleted

## 2018-06-07 LAB — CBC WITH DIFFERENTIAL/PLATELET
BASOS: 1 %
Basophils Absolute: 0 10*3/uL (ref 0.0–0.2)
EOS (ABSOLUTE): 0.1 10*3/uL (ref 0.0–0.4)
EOS: 1 %
HEMATOCRIT: 37.7 % (ref 34.0–46.6)
HEMOGLOBIN: 12.6 g/dL (ref 11.1–15.9)
IMMATURE GRANS (ABS): 0 10*3/uL (ref 0.0–0.1)
IMMATURE GRANULOCYTES: 0 %
LYMPHS: 20 %
Lymphocytes Absolute: 0.9 10*3/uL (ref 0.7–3.1)
MCH: 31 pg (ref 26.6–33.0)
MCHC: 33.4 g/dL (ref 31.5–35.7)
MCV: 93 fL (ref 79–97)
MONOCYTES: 10 %
Monocytes Absolute: 0.4 10*3/uL (ref 0.1–0.9)
NEUTROS PCT: 68 %
Neutrophils Absolute: 3.1 10*3/uL (ref 1.4–7.0)
Platelets: 230 10*3/uL (ref 150–450)
RBC: 4.06 x10E6/uL (ref 3.77–5.28)
RDW: 14.3 % (ref 12.3–15.4)
WBC: 4.5 10*3/uL (ref 3.4–10.8)

## 2018-06-07 NOTE — Telephone Encounter (Signed)
-----   Message from Asa Lente, MD sent at 06/07/2018  2:34 PM EDT ----- Please let the patient know that the lab work is fine.

## 2018-06-07 NOTE — Telephone Encounter (Signed)
Spoke with Veronica Frazier and advised labs done in our office yesterday are fine. She verbalized understanding of same/fim

## 2018-06-28 ENCOUNTER — Telehealth: Payer: Self-pay | Admitting: *Deleted

## 2018-06-28 MED ORDER — DIMETHYL FUMARATE 240 MG PO CPDR: 240.0000 mg | DELAYED_RELEASE_CAPSULE | Freq: Two times a day (BID) | ORAL | 3 refills | Status: DC

## 2018-06-28 NOTE — Telephone Encounter (Signed)
Tecfidera printed and faxed to [facility] in response to faxed request from them/fim

## 2018-07-09 DIAGNOSIS — S99912A Unspecified injury of left ankle, initial encounter: Secondary | ICD-10-CM | POA: Diagnosis not present

## 2018-07-09 DIAGNOSIS — M25512 Pain in left shoulder: Secondary | ICD-10-CM | POA: Diagnosis not present

## 2018-07-09 DIAGNOSIS — S4992XA Unspecified injury of left shoulder and upper arm, initial encounter: Secondary | ICD-10-CM | POA: Diagnosis not present

## 2018-07-09 DIAGNOSIS — S40012A Contusion of left shoulder, initial encounter: Secondary | ICD-10-CM | POA: Diagnosis not present

## 2018-07-09 DIAGNOSIS — M79622 Pain in left upper arm: Secondary | ICD-10-CM | POA: Diagnosis not present

## 2018-07-09 DIAGNOSIS — M25572 Pain in left ankle and joints of left foot: Secondary | ICD-10-CM | POA: Diagnosis not present

## 2018-07-10 ENCOUNTER — Other Ambulatory Visit: Payer: Self-pay | Admitting: Neurology

## 2018-07-10 NOTE — Telephone Encounter (Signed)
Patient requesting refill of amphetamine-dextroamphetamine (ADDERALL) 10 MG tablet sent to [facility] on [address] in [place]. I advised Dr. Epimenio Foot is out of the office and will send to his nurse.

## 2018-07-11 ENCOUNTER — Other Ambulatory Visit: Payer: Self-pay | Admitting: Neurology

## 2018-07-11 MED ORDER — AMPHETAMINE-DEXTROAMPHETAMINE 10 MG PO TABS: ORAL_TABLET | ORAL | 0 refills | Status: DC

## 2018-07-11 NOTE — Addendum Note (Signed)
Addended by: Candis Schatz I on: 07/11/2018 07:54 AM   Modules accepted: Orders

## 2018-07-24 DIAGNOSIS — Z7409 Other reduced mobility: Secondary | ICD-10-CM | POA: Diagnosis not present

## 2018-07-24 DIAGNOSIS — G35 Multiple sclerosis: Secondary | ICD-10-CM | POA: Diagnosis not present

## 2018-07-24 DIAGNOSIS — Z9181 History of falling: Secondary | ICD-10-CM | POA: Diagnosis not present

## 2018-08-13 ENCOUNTER — Other Ambulatory Visit: Payer: Self-pay | Admitting: Neurology

## 2018-08-13 MED ORDER — AMPHETAMINE-DEXTROAMPHETAMINE 10 MG PO TABS: ORAL_TABLET | ORAL | 0 refills | Status: DC

## 2018-08-13 NOTE — Addendum Note (Signed)
Addended by: Candis Schatz I on: 08/13/2018 01:05 PM   Modules accepted: Orders

## 2018-08-13 NOTE — Telephone Encounter (Signed)
Patient requesting refill of amphetamine-dextroamphetamine (ADDERALL) 10 MG tablet sent to [facility] on [address] in [place].

## 2018-08-16 ENCOUNTER — Other Ambulatory Visit: Payer: Self-pay | Admitting: Neurology

## 2018-09-06 ENCOUNTER — Other Ambulatory Visit: Payer: Self-pay | Admitting: Neurology

## 2018-09-06 MED ORDER — AMPHETAMINE-DEXTROAMPHETAMINE 10 MG PO TABS: ORAL_TABLET | ORAL | 0 refills | Status: DC

## 2018-09-06 NOTE — Telephone Encounter (Addendum)
Checked drug registry. She last refilled rx adderall 08/14/18 #90. Not receiving from other MD's. Last seen 06/06/18 and next f/u 10/16/18. Rx refill request sent to Dr. Epimenio Foot

## 2018-09-06 NOTE — Telephone Encounter (Signed)
Pt has called for a refill on amphetamine-dextroamphetamine (ADDERALL) 10 MG tablet [address] [place] [address] [place], [place] - [address] [phone] (Phone) [phone] (Fax)

## 2018-09-06 NOTE — Addendum Note (Signed)
Addended by: Eilene Ghazi L on: 09/06/2018 12:01 PM   Modules accepted: Orders

## 2018-09-17 ENCOUNTER — Telehealth: Payer: Self-pay | Admitting: *Deleted

## 2018-09-17 NOTE — Telephone Encounter (Signed)
Received fax notification from [facility] that JCV lab drawn 06-06-18 negative, titer: 0.17.

## 2018-09-18 DIAGNOSIS — J069 Acute upper respiratory infection, unspecified: Secondary | ICD-10-CM | POA: Diagnosis not present

## 2018-09-18 DIAGNOSIS — R05 Cough: Secondary | ICD-10-CM | POA: Diagnosis not present

## 2018-09-24 DIAGNOSIS — J40 Bronchitis, not specified as acute or chronic: Secondary | ICD-10-CM | POA: Diagnosis not present

## 2018-10-06 ENCOUNTER — Other Ambulatory Visit: Payer: Self-pay | Admitting: Neurology

## 2018-10-12 ENCOUNTER — Other Ambulatory Visit: Payer: Self-pay | Admitting: Neurology

## 2018-10-16 ENCOUNTER — Encounter

## 2018-10-16 ENCOUNTER — Ambulatory Visit: Payer: PPO | Admitting: Neurology

## 2018-10-17 ENCOUNTER — Encounter: Payer: Self-pay | Admitting: Neurology

## 2018-11-01 ENCOUNTER — Other Ambulatory Visit: Payer: Self-pay | Admitting: Neurology

## 2018-11-01 MED ORDER — AMPHETAMINE-DEXTROAMPHETAMINE 10 MG PO TABS: ORAL_TABLET | ORAL | 0 refills | Status: DC

## 2018-11-01 NOTE — Telephone Encounter (Signed)
Called pt. Advised I reviewed her chart and she was last seen 05/2018. She cx 09/2018 appt d/t being sick and r/s for 03/2019. Advised she needed to be seen sooner. Scheduled f/u for 11/16/2018 at 9am with Dr. Epimenio Foot.  Forwarded refill request to Dr. Frances Furbish since Dr. Epimenio Foot out of the office. Pt aware.  Drug registry checked and pt last refilled 09/15/18 #90, not receiving from other MD's.

## 2018-11-01 NOTE — Telephone Encounter (Signed)
Pt is needing a refill on her amphetamine-dextroamphetamine (ADDERALL) 10 MG tablet Send to [facility]

## 2018-11-16 ENCOUNTER — Ambulatory Visit (INDEPENDENT_AMBULATORY_CARE_PROVIDER_SITE_OTHER): Payer: PPO | Admitting: Neurology

## 2018-11-16 ENCOUNTER — Encounter: Payer: Self-pay | Admitting: Neurology

## 2018-11-16 VITALS — BP 124/80 | HR 79 | Ht 67.0 in | Wt 202.0 lb

## 2018-11-16 DIAGNOSIS — Z79899 Other long term (current) drug therapy: Secondary | ICD-10-CM

## 2018-11-16 DIAGNOSIS — R269 Unspecified abnormalities of gait and mobility: Secondary | ICD-10-CM

## 2018-11-16 DIAGNOSIS — R5382 Chronic fatigue, unspecified: Secondary | ICD-10-CM | POA: Diagnosis not present

## 2018-11-16 DIAGNOSIS — G35 Multiple sclerosis: Secondary | ICD-10-CM | POA: Diagnosis not present

## 2018-11-16 DIAGNOSIS — G4700 Insomnia, unspecified: Secondary | ICD-10-CM | POA: Diagnosis not present

## 2018-11-16 MED ORDER — LORAZEPAM 1 MG PO TABS: 1.0000 mg | ORAL_TABLET | Freq: Every day | ORAL | 5 refills | Status: DC

## 2018-11-16 NOTE — Progress Notes (Signed)
[facility]  PATIENT: Veronica Frazier DOB: 1966/02/03  REFERRING CLINICIAN: Harlene Ramus HISTORY FROM: patient REASON FOR VISIT: MS   HISTORICAL  CHIEF COMPLAINT:  Chief Complaint  Patient presents with  . Follow-up    RM 13, alone. Last seen 06/06/18. She recently had bronchitis over thanskgiving.   . Multiple Sclerosis    On Tecfidera. Tolerating well.   Genia Hotter two nights ago and has bruises on knees. Left leg gives out on her. She has waekness in both legs. She got approved for [facility] but thinks [facility] needs forms filled out. I provided her fax [phone] for her to have them fax forms if we do need to fill anything out.    HISTORY OF PRESENT ILLNESS:  Veronica Frazier is a 53 y.o. woman with MS and related symptoms.    Update 11/16/18: She is on Tecfidera and tolerates  It well.   She denies exacerbations but has had some falls due to weakness in her left side as the day goes on.    Her left side is weaker than her right.   She notes no spasticity.  In general, her gait worsens as the day goes on and she has had frequent falls especially in the late afternoons and evenings.  She was evaluated by PT and had a wheelchair evaluation.   Hand strength is fine.   She feels she is clumsy and drops items.     Vision is ok Bladder is doing well.  Her main problem is fatigue.   She gets a benefit from modafinil and Adderall but feels it wears off by the afternoon.    She takes Provigil 100 mg in am and 100 mg in mid afternoon and Adderall 10 mg po qAM and again around noon and around 3 pm.     Mood is doing well.   She notes some verbal fluency issues and some reduced orientation around day of the week.    She is sleeping well most nights but has one x nocturia but quickly falls asleep  Update 06/06/2018: She has not noted any exacerbations and feels her MS is mostly stable.  She is on Tecfidera 240 mg twice daily and tolerates it fairly well.  Last MRI of her  brain, 09/01/2017, did not show any new findings.  Many foci are nonspecific, so she appears to have a mixture of both demyelinating and chronic microvascular ischemic change foci  She feels her gait is worse due to left ankle swelling.   She wears a brace.   Pain is throbbing there.     She has more difficulties with fatigue even when she is taking phentermine and Provigil.  She currently takes Provigil 300 mg daily.  Im the past, IV steroid helped some.   She notes insomnia and has difficulty staying asleep more than falling asleep. She often is awake in the middle of the night.    She takes lorazepam.  Doxepin caused her to be drowsy the next day.   She has had some difficulties with depression and anxiety but feels this is stable.  She is on duloxetine and fluoxetine.   She feels her mood is stable.    Update to 02/17/2018: She feels her MS has been stable. She is on Tecfidera and tolerates it well. She denies any recent issues with flushing or GI symptoms.   She notes no change in her gait, strength or sensation since last visit. Bladder function  is doing well. She has had some blurry vision but no significant change since the last visit.   The MRI of the brain 09/01/2017 did not show any acute findings. Has many T2/FLAIR hyperintense foci. A little more consistent with MS and other look more consistent with chronic microvascular ischemic change. Her homocystine was elevated so she was started on l-methylfolate. She cannot afford it as it is not covered by her insurance.  She has difficulty with fatigue and has had some daytime sleepiness.   Phentermine and Provigil have helped sleepiness.  She stopped doxepin for insomnia due to a hangover the next day.  She is using an OTC sleep aid and does better with it.    Mood is better.   She remains on Duloxetine and fluoxetine.      From 05/31/2017: MS:  As a disease modifying therapy, she is on Tecfidera. She tolerates it well. Initially she had mild GI  symptoms but these no longer occur. She does not have flushing.    She is noting more blurry vision and worse STM.   Gait/strength/sensation:   Her balance and gait are stable.   She has had a few falls but usually if she trips.   She tires out quickly with walking.  She has more trouble inside and outside her home.     She has mild right arm dysesthesia   Vision:   She notes more blurred vision that has been gradual.    Depth perception is often off.   Vision is worse if she has a headache.    Bladder:  She denies much difficulties with her bladder. Bowel is doing better (had colitis).    Pain:   Her back and hand pain has done better this year.      Mood/ Cognition:  She feels depression is mildly worse.    She is going through a divorce (18 years marriage).   She feels mood is better on duloxetine and fluoxetine.  She has cognitive difficulties, Specifically, she notes verbal fluency and short term memory.   Fatigue/sleep:  She has difficulty with fatigue and she has some daytime sleepiness. This is usually worse in the heat.   She gets benefit from Provigil (takes 1. To 1.5 pills daily) and phentermine.   She has sleep maintenance worse than sleep onset insomnia.   Lorazepam has helped her fall asleep more than stay asleep.   Flexeril causes a hangover.      MS History:   She presented in 2000  with pain and tingling in the left arm. At that time, she had an MRI of the brain performed that was concerning for multiple sclerosis. Additionally, she had a lumbar puncture performed with CSF that was consistent with MS. Initially, she was placed on Avonex injections weekly. Initially, she did well with the Avonex but then had an MRI that showed some progression. About 2014 or 2015,  she switched to Tecfidera.     REVIEW OF SYSTEMS:  Constitutional: No fevers, chills, sweats, or change in appetite   She has fatigue that has worsened Eyes: Blurry vision and rare diplopia.  No eye pain Ear, nose and  throat: No hearing loss, ear pain, nasal congestion, sore throat Cardiovascular: No chest pain, palpitations Respiratory:  No shortness of breath at rest or with exertion.   No wheezes   She snores GastrointestinaI: No nausea but recent diarrhea and constipation.    Colitis being treated Genitourinary:  No dysuria, urinary retention or  frequency.  No nocturia. Musculoskeletal:  No neck pain.  Mild axial back pain.   Right arm pain Integumentary: No rash, pruritus, skin lesions Neurological: as above Psychiatric:  She has some depression Endocrine: No palpitations, diaphoresis.     Hematologic/Lymphatic:  No anemia, purpura, petechiae. Allergic/Immunologic: No itchy/runny eyes, nasal congestion, recent allergic reactions, rashes  ALLERGIES: No Known Allergies  HOME MEDICATIONS: Outpatient Medications Prior to Visit  Medication Sig Dispense Refill  . amphetamine-dextroamphetamine (ADDERALL) 10 MG tablet Take 2 to 3 tablets daily 90 tablet 0  . aspirin-acetaminophen-caffeine (EXCEDRIN MIGRAINE) 250-250-65 MG per tablet Take 1 tablet by mouth 3 (three) times daily as needed.      Marland Kitchen atorvastatin (LIPITOR) 40 MG tablet TAKE 1 TABLET BY MOUTH ONCE DAILY AT NIGHT  3  . cyclobenzaprine (FLEXERIL) 5 MG tablet Take 1 tablet (5 mg total) by mouth at bedtime. (Patient taking differently: Take 5 mg by mouth as needed. ) 30 tablet 5  . Dimethyl Fumarate 240 MG CPDR Take 1 capsule (240 mg total) by mouth 2 (two) times daily at 10 AM and 5 PM. 180 capsule 3  . DULoxetine (CYMBALTA) 60 MG capsule TAKE 1 CAPSULE BY MOUTH ONCE DAILY 90 capsule 2  . FLUoxetine (PROZAC) 40 MG capsule TAKE 1 CAPSULE BY MOUTH ONCE DAILY 90 capsule 1  . Folic Acid-Vit B6-Vit B12 (FOLBEE) 2.5-25-1 MG TABS tablet Take 1 tablet by mouth daily. 90 tablet 4  . gabapentin (NEURONTIN) 300 MG capsule TAKE 1 CAPSULE BY MOUTH 4 TIMES DAILY 360 capsule 1  . LOSARTAN POTASSIUM PO Take by mouth.    . meloxicam (MOBIC) 15 MG tablet TAKE 1  TABLET BY MOUTH ONCE DAILY 90 tablet 3  . modafinil (PROVIGIL) 200 MG tablet TAKE 1/2 (ONE-HALF) TABLET BY MOUTH TWICE DAILY TO THREE TIMES DAILY 135 tablet 1  . Multiple Vitamins-Calcium (ONE-A-DAY WOMENS FORMULA PO) Take 1 tablet by mouth daily.      Marland Kitchen omeprazole (PRILOSEC) 20 MG capsule     . LORazepam (ATIVAN) 1 MG tablet TAKE 1 TABLET BY MOUTH ONCE DAILY AT BEDTIME 30 tablet 5   No facility-administered medications prior to visit.     PAST MEDICAL HISTORY: Past Medical History:  Diagnosis Date  . Hypertension   . Multiple sclerosis (HCC)   . Neuropathy   . Stroke (HCC)   . Vision abnormalities     PAST SURGICAL HISTORY: Past Surgical History:  Procedure Laterality Date  . ABDOMINAL HYSTERECTOMY    . APPENDECTOMY    . BREAST SURGERY    . CHOLECYSTECTOMY    . TONSILLECTOMY      FAMILY HISTORY: Family History  Problem Relation Age of Onset  . Hypertension Mother   . Hyperlipidemia Mother   . Colitis Mother   . Hypertension Father     SOCIAL HISTORY:  Social History   Socioeconomic History  . Marital status: Married    Spouse name: Not on file  . Number of children: Not on file  . Years of education: Not on file  . Highest education level: Not on file  Occupational History  . Not on file  Social Needs  . Financial resource strain: Not on file  . Food insecurity:    Worry: Not on file    Inability: Not on file  . Transportation needs:    Medical: Not on file    Non-medical: Not on file  Tobacco Use  . Smoking status: Current Every Day Smoker  . Smokeless tobacco: Never  Used  Substance and Sexual Activity  . Alcohol use: No    Alcohol/week: 0.0 standard drinks  . Drug use: No  . Sexual activity: Not on file  Lifestyle  . Physical activity:    Days per week: Not on file    Minutes per session: Not on file  . Stress: Not on file  Relationships  . Social connections:    Talks on phone: Not on file    Gets together: Not on file    Attends  religious service: Not on file    Active member of club or organization: Not on file    Attends meetings of clubs or organizations: Not on file    Relationship status: Not on file  . Intimate partner violence:    Fear of current or ex partner: Not on file    Emotionally abused: Not on file    Physically abused: Not on file    Forced sexual activity: Not on file  Other Topics Concern  . Not on file  Social History Narrative  . Not on file     PHYSICAL EXAM  Vitals:   11/16/18 0846  BP: 124/80  Pulse: 79  Weight: 202 lb (91.6 kg)  Height: 5\' 7"  (1.702 m)    Body mass index is 31.64 kg/m.   General: The patient is well-developed and well-nourished and in no acute distress.   No rashes or edema     Neurologic Exam  Mental status: The patient is alert and oriented x 3 at the time of the examination. The patient has apparent normal recent and remote memory, with an apparently normal attention span and concentration ability.   Speech is normal.  Cranial nerves: Extraocular movements are full.   Facial strength and sensation was normal.  Trapezius strength was normal.  Motor:  Muscle bulk and tone are normal.  Strength is 5/5  Sensory: Sensory testing is intact to touch in all 4 extremities.  Coordination: Cerebellar testing shows normal finger-nose-finger..  Gait and station: Station is normal.  Gait is slightly wide.  Tandem gait is wide.  Romberg is negative.  Reflexes: Deep tendon reflexes are symmetric and normal bilaterally.     DIAGNOSTIC DATA (LABS, IMAGING, TESTING) - I reviewed patient records, labs, notes, testing and imaging myself where available.  Lab Results  Component Value Date   WBC 4.5 06/06/2018   HGB 12.6 06/06/2018   HCT 37.7 06/06/2018   MCV 93 06/06/2018   PLT 230 06/06/2018      Component Value Date/Time   NA 136 12/30/2017 2327   K 3.4 (L) 12/30/2017 2327   CL 99 (L) 12/30/2017 2327   CO2 27 12/30/2017 2327   GLUCOSE 146 (H)  12/30/2017 2327   BUN 18 12/30/2017 2327   CREATININE 0.72 12/30/2017 2327   CALCIUM 9.2 12/30/2017 2327   PROT 7.7 12/30/2017 2327   ALBUMIN 4.2 12/30/2017 2327   AST 23 12/30/2017 2327   ALT 31 12/30/2017 2327   ALKPHOS 88 12/30/2017 2327   BILITOT 0.9 12/30/2017 2327   GFRNONAA >60 12/30/2017 2327   GFRAA >60 12/30/2017 2327       ASSESSMENT AND PLAN  Multiple sclerosis (HCC) - Plan: CBC with Differential/Platelet  High risk medication use - Plan: CBC with Differential/Platelet  Chronic fatigue  Insomnia, unspecified type  Gait disturbance    1.   Continue Tecfidera. Check CBC --- she had a reading of 0.4 but the next reading was 0.9 with lymphocytes if she  continues to fluctuate with some 0.5 or below we would need to switch meds.  Consider Aubagio if this happens.   2.   Continue Provigil and Adderall.    Renew Lorazepam at night. 3.   She had a Rehab evaluation and [facility] is setting her up for an electric wheelchair due to her many falls, especially later in the afternoon/evening 4.   She will return to see me in 4 -5 months or sooner if she has new or worsening symptoms.    A. Epimenio Foot, MD, PhD 11/16/2018, 9:14 AM Certified in Neurology, Clinical Neurophysiology, Sleep Medicine, Pain Medicine and Neuroimaging  [facility] [address], [address] [place], [place] [address] [phone]

## 2018-11-17 LAB — CBC WITH DIFFERENTIAL/PLATELET
BASOS: 1 %
Basophils Absolute: 0 10*3/uL (ref 0.0–0.2)
EOS (ABSOLUTE): 0.1 10*3/uL (ref 0.0–0.4)
EOS: 3 %
HEMATOCRIT: 37.1 % (ref 34.0–46.6)
HEMOGLOBIN: 12.5 g/dL (ref 11.1–15.9)
IMMATURE GRANS (ABS): 0 10*3/uL (ref 0.0–0.1)
Immature Granulocytes: 0 %
LYMPHS: 18 %
Lymphocytes Absolute: 0.6 10*3/uL — ABNORMAL LOW (ref 0.7–3.1)
MCH: 31.1 pg (ref 26.6–33.0)
MCHC: 33.7 g/dL (ref 31.5–35.7)
MCV: 92 fL (ref 79–97)
MONOCYTES: 10 %
Monocytes Absolute: 0.3 10*3/uL (ref 0.1–0.9)
NEUTROS ABS: 2.5 10*3/uL (ref 1.4–7.0)
Neutrophils: 68 %
PLATELETS: 230 10*3/uL (ref 150–450)
RBC: 4.02 x10E6/uL (ref 3.77–5.28)
RDW: 13 % (ref 11.7–15.4)
WBC: 3.6 10*3/uL (ref 3.4–10.8)

## 2018-11-19 ENCOUNTER — Other Ambulatory Visit: Payer: Self-pay | Admitting: Neurology

## 2018-11-19 DIAGNOSIS — G35 Multiple sclerosis: Secondary | ICD-10-CM

## 2018-11-19 DIAGNOSIS — Z79899 Other long term (current) drug therapy: Secondary | ICD-10-CM

## 2018-11-20 ENCOUNTER — Telehealth: Payer: Self-pay | Admitting: *Deleted

## 2018-11-20 NOTE — Telephone Encounter (Signed)
Called, LVM for pt to call about results.  

## 2018-11-20 NOTE — Telephone Encounter (Signed)
-----   Message from Asa Lente, MD sent at 11/19/2018  7:01 PM EST ----- Please let her know that the lymphocyte count was a little low.  It had been low in the past but had also been in the normal range in the past.  We do need to recheck this in about 2 months.  If she does have 2 low readings in a row we will need to consider a different disease modifying therapy.   I placed an order to recheck the lymphocyte count in 2 months.

## 2018-11-22 NOTE — Telephone Encounter (Signed)
Called and spoke with pt about results per Dr. Epimenio Foot note. She verbalized understanding. She will come back in a couple months for repeat labs per Dr. Epimenio Foot.

## 2018-11-26 ENCOUNTER — Telehealth: Payer: Self-pay | Admitting: Neurology

## 2018-11-26 NOTE — Telephone Encounter (Signed)
I spoke with Veronica Frazier about the wheelchair request.   As the need is for only part of the day, she could use a simpler wheelchair.

## 2018-12-04 ENCOUNTER — Other Ambulatory Visit: Payer: Self-pay | Admitting: Neurology

## 2018-12-04 MED ORDER — AMPHETAMINE-DEXTROAMPHETAMINE 10 MG PO TABS: ORAL_TABLET | ORAL | 0 refills | Status: DC

## 2018-12-04 NOTE — Telephone Encounter (Signed)
Pt request refill for amphetamine-dextroamphetamine (ADDERALL) 10 MG tablet sent to [facility] [address] [place], [place] - [address]

## 2018-12-26 ENCOUNTER — Other Ambulatory Visit: Payer: Self-pay | Admitting: Neurology

## 2019-01-03 ENCOUNTER — Other Ambulatory Visit: Payer: Self-pay | Admitting: Neurology

## 2019-01-03 MED ORDER — AMPHETAMINE-DEXTROAMPHETAMINE 10 MG PO TABS: ORAL_TABLET | ORAL | 0 refills | Status: DC

## 2019-01-03 NOTE — Telephone Encounter (Signed)
Pt request refill for amphetamine-dextroamphetamine (ADDERALL) 10 MG tablet sent to [facility]

## 2019-01-07 MED ORDER — AMPHETAMINE-DEXTROAMPHETAMINE 10 MG PO TABS: ORAL_TABLET | ORAL | 0 refills | Status: DC

## 2019-01-07 NOTE — Addendum Note (Signed)
Addended by: Hillis Range on: 01/07/2019 03:20 PM   Modules accepted: Orders

## 2019-01-07 NOTE — Telephone Encounter (Signed)
Pt said medication is on backorder at [facility]. She is requesting that script to be cancelled. She has found it at [facility] [address]. Please send script

## 2019-01-08 ENCOUNTER — Other Ambulatory Visit: Payer: Self-pay | Admitting: Neurology

## 2019-01-10 ENCOUNTER — Telehealth: Payer: Self-pay | Admitting: Neurology

## 2019-01-10 NOTE — Telephone Encounter (Signed)
Pt is wanting to get IV infusion. Pt states her body is run down. Please call to advise

## 2019-01-10 NOTE — Telephone Encounter (Signed)
I called pt back. Offered work in for IV steroids today if she could get here before 3pm. That is when [facility] can work her in for infusion. Pt declined stating she could not make it here by then and she has to pick up her kids by 330pm. She will give it the weekend and see how she feels. She will call back the first of next week to give me an update.

## 2019-01-10 NOTE — Telephone Encounter (Signed)
I called patient back. She fell and broke a couple of her toes two days ago. She did not go to get evaluated. they  turned black and blue yesterday are are swollen. She is very fatigued and "run down". Concerned about low WBC.   Has not gotten a WC from [facility] yet. She has not heard any updates from them about this. I recommended she call for update and let us know.  She denies any signs of infection, has not started any new meds. Starting to get headaches. Over the weekend, she ran out of adderall for a few days and she thought this was what was causing her fatigue. She has now been taking for a couple days and it has not improved. She wants to come in for IV treatment. Advised I will speak with Dr. Epimenio Foot and call back.

## 2019-01-21 ENCOUNTER — Telehealth: Payer: Self-pay | Admitting: Neurology

## 2019-01-21 NOTE — Telephone Encounter (Signed)
Pt was told 2 mths ago to come for labs this month. She is wanting to know if she should come, does she need to make an appt?? Please call to advise

## 2019-01-21 NOTE — Telephone Encounter (Signed)
Spoke with Dr. Epimenio Foot- she is okay to come in and get repeat CBC diff/platelet. Lab order already placed.   I called pt and let her know. She will come this week to have labs completed.

## 2019-02-08 ENCOUNTER — Other Ambulatory Visit: Payer: Self-pay | Admitting: Neurology

## 2019-02-08 NOTE — Telephone Encounter (Signed)
Pt called in for a refill of amphetamine-dextroamphetamine (ADDERALL) 10 MG tablet to be sent to [facility] [address] [place], [place] - [address]

## 2019-02-11 MED ORDER — AMPHETAMINE-DEXTROAMPHETAMINE 10 MG PO TABS: ORAL_TABLET | ORAL | 0 refills | Status: DC

## 2019-03-07 ENCOUNTER — Other Ambulatory Visit: Payer: Self-pay | Admitting: Neurology

## 2019-03-12 ENCOUNTER — Encounter

## 2019-03-12 ENCOUNTER — Ambulatory Visit: Payer: PPO | Admitting: Neurology

## 2019-03-15 ENCOUNTER — Other Ambulatory Visit: Payer: Self-pay | Admitting: Neurology

## 2019-03-15 NOTE — Telephone Encounter (Signed)
Pt is needing a refill on her amphetamine-dextroamphetamine (ADDERALL) 10 MG tablet sent to the [facility] on [address] °

## 2019-03-18 ENCOUNTER — Telehealth: Payer: Self-pay | Admitting: Neurology

## 2019-03-18 ENCOUNTER — Other Ambulatory Visit: Payer: Self-pay | Admitting: Neurology

## 2019-03-18 DIAGNOSIS — G35 Multiple sclerosis: Secondary | ICD-10-CM

## 2019-03-18 MED ORDER — AMPHETAMINE-DEXTROAMPHETAMINE 10 MG PO TABS: ORAL_TABLET | ORAL | 0 refills | Status: DC

## 2019-03-18 MED ORDER — DIMETHYL FUMARATE 240 MG PO CPDR: 240.0000 mg | DELAYED_RELEASE_CAPSULE | Freq: Two times a day (BID) | ORAL | 3 refills | Status: DC

## 2019-03-18 NOTE — Telephone Encounter (Signed)
Dr. Epimenio Foot- there is a prescription refill request in your rx refills. Can you escribe from 03/15/19? Thank yous

## 2019-03-18 NOTE — Telephone Encounter (Signed)
Pt called again stating tht her medication amphetamine-dextroamphetamine (ADDERALL) 10 MG tablet is still not at the pharmacy [facility] on [address] Please advise.

## 2019-03-18 NOTE — Telephone Encounter (Signed)
Pt called in for a refill of Dimethyl Fumarate 240 MG CPDR to be sent to [facility]) - [place], [place] - [address]

## 2019-03-18 NOTE — Telephone Encounter (Signed)
E-scribed rx to pharmacy as requested.  

## 2019-04-17 ENCOUNTER — Other Ambulatory Visit: Payer: Self-pay | Admitting: Neurology

## 2019-04-17 MED ORDER — AMPHETAMINE-DEXTROAMPHETAMINE 10 MG PO TABS: ORAL_TABLET | ORAL | 0 refills | Status: DC

## 2019-04-17 NOTE — Telephone Encounter (Signed)
.  Pt is requesting a refill of amphetamine-dextroamphetamine (ADDERALL) 10 MG tablet , to be sent to [place], [place]

## 2019-05-09 ENCOUNTER — Encounter: Payer: Self-pay | Admitting: Neurology

## 2019-05-15 ENCOUNTER — Other Ambulatory Visit: Payer: Self-pay | Admitting: Neurology

## 2019-05-15 MED ORDER — AMPHETAMINE-DEXTROAMPHETAMINE 10 MG PO TABS: ORAL_TABLET | ORAL | 0 refills | Status: DC

## 2019-05-17 ENCOUNTER — Ambulatory Visit: Payer: PPO | Admitting: Neurology

## 2019-05-23 ENCOUNTER — Other Ambulatory Visit: Payer: Self-pay | Admitting: Neurology

## 2019-06-04 ENCOUNTER — Other Ambulatory Visit: Payer: Self-pay

## 2019-06-04 ENCOUNTER — Encounter: Payer: Self-pay | Admitting: Neurology

## 2019-06-04 ENCOUNTER — Ambulatory Visit (INDEPENDENT_AMBULATORY_CARE_PROVIDER_SITE_OTHER): Payer: PPO | Admitting: Neurology

## 2019-06-04 VITALS — BP 100/65 | HR 73 | Temp 97.5°F | Ht 67.0 in | Wt 199.0 lb

## 2019-06-04 DIAGNOSIS — G4700 Insomnia, unspecified: Secondary | ICD-10-CM

## 2019-06-04 DIAGNOSIS — G35 Multiple sclerosis: Secondary | ICD-10-CM

## 2019-06-04 DIAGNOSIS — R5382 Chronic fatigue, unspecified: Secondary | ICD-10-CM

## 2019-06-04 DIAGNOSIS — R269 Unspecified abnormalities of gait and mobility: Secondary | ICD-10-CM

## 2019-06-04 MED ORDER — MODAFINIL 200 MG PO TABS: ORAL_TABLET | ORAL | 1 refills | Status: DC

## 2019-06-04 NOTE — Progress Notes (Signed)
[facility]  PATIENT: Veronica Frazier DOB: 09/13/1966  REFERRING CLINICIAN: Harlene RamusGretchen Velazquesz HISTORY FROM: patient REASON FOR VISIT: MS   HISTORICAL  CHIEF COMPLAINT:  Chief Complaint  Patient presents with  . Follow-up    RM 13, alone. Last seen 11/16/18.   . Multiple Sclerosis    On Tecfidera. Takes lorazepam qhs  . Fatigue    Taking provigil, adderall. NEEDS REFILL ON PROVIGIL  . Form Completion    [facility] on what to include in office note for power WC (paper in room)    HISTORY OF PRESENT ILLNESS:  Veronica Frazier is a 53 y.o. woman with MS and related symptoms.    Update 06/04/2019: MS is stable.  She is on Tecfidera and tolerates it well.   Lymphocyte counts have fluctuated between 0.6 and 0.9Gait is better than last year.   She denies much weakness or numbness but her strength fatigues easily, especially in heat.  She was having a lot of falls after walking longer distances and sometimes even in her home last year and this year, breaking her shoulder with one fall.    She is doing better now.    She needed to rent scooters when she went out wit her family.   With Covid she is doing less outside.   We discussed options.  An electric wheelchair was recommended by her PT.  However, she only requires assistance for short distances  MRI shows extensive white matter change - some looking more c/w MS and others more consistent with small vessel ischemic changes.   She has HTN but not DM.  She used to smoke.     Update 11/16/18: She is on Tecfidera and tolerates  It well.   She denies exacerbations but has had some falls due to weakness in her left side as the day goes on.    Her left side is weaker than her right.   She notes no spasticity.  In general, her gait worsens as the day goes on and she has had frequent falls especially in the late afternoons and evenings.  She was evaluated by PT and had a wheelchair evaluation.   Hand  strength is fine.   She feels she is clumsy and drops items.     Vision is ok Bladder is doing well.  Her main problem is fatigue.   She gets a benefit from modafinil and Adderall but feels it wears off by the afternoon.    She takes Provigil 100 mg in am and 100 mg in mid afternoon and Adderall 10 mg po qAM and again around noon and around 3 pm.     Mood is doing well.   She notes some verbal fluency issues and some reduced orientation around day of the week.    She is sleeping well most nights but has one x nocturia but quickly falls asleep  Update 06/06/2018: She has not noted any exacerbations and feels her MS is mostly stable.  She is on Tecfidera 240 mg twice daily and tolerates it fairly well.  Last MRI of her brain, 09/01/2017, did not show any new findings.  Many foci are nonspecific, so she appears to have a mixture of both demyelinating and chronic microvascular ischemic change foci  She feels her gait is worse due to left ankle swelling.   She wears a brace.   Pain is throbbing there.     She has more difficulties with fatigue even  when she is taking phentermine and Provigil.  She currently takes Provigil 300 mg daily.  Im the past, IV steroid helped some.   She notes insomnia and has difficulty staying asleep more than falling asleep. She often is awake in the middle of the night.    She takes lorazepam.  Doxepin caused her to be drowsy the next day.   She has had some difficulties with depression and anxiety but feels this is stable.  She is on duloxetine and fluoxetine.   She feels her mood is stable.    Update to 02/17/2018: She feels her MS has been stable. She is on Tecfidera and tolerates it well. She denies any recent issues with flushing or GI symptoms.   She notes no change in her gait, strength or sensation since last visit. Bladder function is doing well. She has had some blurry vision but no significant change since the last visit.   The MRI of the brain 09/01/2017 did not show  any acute findings. Has many T2/FLAIR hyperintense foci. A little more consistent with MS and other look more consistent with chronic microvascular ischemic change. Her homocystine was elevated so she was started on l-methylfolate. She cannot afford it as it is not covered by her insurance.  She has difficulty with fatigue and has had some daytime sleepiness.   Phentermine and Provigil have helped sleepiness.  She stopped doxepin for insomnia due to a hangover the next day.  She is using an OTC sleep aid and does better with it.    Mood is better.   She remains on Duloxetine and fluoxetine.      From 05/31/2017: MS:  As a disease modifying therapy, she is on Tecfidera. She tolerates it well. Initially she had mild GI symptoms but these no longer occur. She does not have flushing.    She is noting more blurry vision and worse STM.   Gait/strength/sensation:   Her balance and gait are stable.   She has had a few falls but usually if she trips.   She tires out quickly with walking.  She has more trouble inside and outside her home.     She has mild right arm dysesthesia   Vision:   She notes more blurred vision that has been gradual.    Depth perception is often off.   Vision is worse if she has a headache.    Bladder:  She denies much difficulties with her bladder. Bowel is doing better (had colitis).    Pain:   Her back and hand pain has done better this year.      Mood/ Cognition:  She feels depression is mildly worse.    She is going through a divorce (18 years marriage).   She feels mood is better on duloxetine and fluoxetine.  She has cognitive difficulties, Specifically, she notes verbal fluency and short term memory.   Fatigue/sleep:  She has difficulty with fatigue and she has some daytime sleepiness. This is usually worse in the heat.   She gets benefit from Provigil (takes 1. To 1.5 pills daily) and phentermine.   She has sleep maintenance worse than sleep onset insomnia.   Lorazepam has  helped her fall asleep more than stay asleep.   Flexeril causes a hangover.      MS History:   She presented in 2000  with pain and tingling in the left arm. At that time, she had an MRI of the brain performed that was concerning for  multiple sclerosis. Additionally, she had a lumbar puncture performed with CSF that was consistent with MS. Initially, she was placed on Avonex injections weekly. Initially, she did well with the Avonex but then had an MRI that showed some progression. About 2014 or 2015,  she switched to Tecfidera.     REVIEW OF SYSTEMS:  Constitutional: No fevers, chills, sweats, or change in appetite   She has fatigue that has worsened Eyes: Blurry vision and rare diplopia.  No eye pain Ear, nose and throat: No hearing loss, ear pain, nasal congestion, sore throat Cardiovascular: No chest pain, palpitations Respiratory:  No shortness of breath at rest or with exertion.   No wheezes   She snores GastrointestinaI: No nausea but recent diarrhea and constipation.    Colitis being treated Genitourinary:  No dysuria, urinary retention or frequency.  No nocturia. Musculoskeletal:  No neck pain.  Mild axial back pain.   Right arm pain Integumentary: No rash, pruritus, skin lesions Neurological: as above Psychiatric:  She has some depression Endocrine: No palpitations, diaphoresis.     Hematologic/Lymphatic:  No anemia, purpura, petechiae. Allergic/Immunologic: No itchy/runny eyes, nasal congestion, recent allergic reactions, rashes  ALLERGIES: No Known Allergies  HOME MEDICATIONS: Outpatient Medications Prior to Visit  Medication Sig Dispense Refill  . amphetamine-dextroamphetamine (ADDERALL) 10 MG tablet Take 2 to 3 tablets daily 90 tablet 0  . aspirin-acetaminophen-caffeine (EXCEDRIN MIGRAINE) 250-250-65 MG per tablet Take 1 tablet by mouth 3 (three) times daily as needed.      Marland Kitchen atorvastatin (LIPITOR) 40 MG tablet TAKE 1 TABLET BY MOUTH ONCE DAILY AT NIGHT  3  .  cyclobenzaprine (FLEXERIL) 5 MG tablet Take 1 tablet (5 mg total) by mouth at bedtime. (Patient taking differently: Take 5 mg by mouth as needed. ) 30 tablet 5  . Dimethyl Fumarate 240 MG CPDR Take 1 capsule (240 mg total) by mouth 2 (two) times daily at 10 AM and 5 PM. 180 capsule 3  . DULoxetine (CYMBALTA) 60 MG capsule TAKE 1 CAPSULE BY MOUTH ONCE DAILY 90 capsule 2  . FLUoxetine (PROZAC) 40 MG capsule Take 1 capsule by mouth once daily 90 capsule 1  . FOLBEE 2.5-25-1 MG TABS tablet TAKE 1 TABLET BY MOUTH ONCE DAILY 90 each 0  . gabapentin (NEURONTIN) 300 MG capsule TAKE 1 CAPSULE BY MOUTH 4 TIMES DAILY 360 capsule 3  . LORazepam (ATIVAN) 1 MG tablet TAKE 1 TABLET BY MOUTH AT BEDTIME 30 tablet 5  . LOSARTAN POTASSIUM PO Take by mouth.    . meloxicam (MOBIC) 15 MG tablet TAKE 1 TABLET BY MOUTH ONCE DAILY 90 tablet 1  . modafinil (PROVIGIL) 200 MG tablet TAKE 1/2 (ONE-HALF) TABLET BY MOUTH TWICE DAILY TO THREE TIMES DAILY 135 tablet 0  . Multiple Vitamins-Calcium (ONE-A-DAY WOMENS FORMULA PO) Take 1 tablet by mouth daily.      Marland Kitchen omeprazole (PRILOSEC) 20 MG capsule      No facility-administered medications prior to visit.     PAST MEDICAL HISTORY: Past Medical History:  Diagnosis Date  . Hypertension   . Multiple sclerosis (HCC)   . Neuropathy   . Stroke (HCC)   . Vision abnormalities     PAST SURGICAL HISTORY: Past Surgical History:  Procedure Laterality Date  . ABDOMINAL HYSTERECTOMY    . APPENDECTOMY    . BREAST SURGERY    . CHOLECYSTECTOMY    . TONSILLECTOMY      FAMILY HISTORY: Family History  Problem Relation Age of Onset  . Hypertension  Mother   . Hyperlipidemia Mother   . Colitis Mother   . Hypertension Father     SOCIAL HISTORY:  Social History   Socioeconomic History  . Marital status: Married    Spouse name: Not on file  . Number of children: Not on file  . Years of education: Not on file  . Highest education level: Not on file  Occupational History   . Not on file  Social Needs  . Financial resource strain: Not on file  . Food insecurity    Worry: Not on file    Inability: Not on file  . Transportation needs    Medical: Not on file    Non-medical: Not on file  Tobacco Use  . Smoking status: Current Every Day Smoker  . Smokeless tobacco: Never Used  Substance and Sexual Activity  . Alcohol use: No    Alcohol/week: 0.0 standard drinks  . Drug use: No  . Sexual activity: Not on file  Lifestyle  . Physical activity    Days per week: Not on file    Minutes per session: Not on file  . Stress: Not on file  Relationships  . Social Musician on phone: Not on file    Gets together: Not on file    Attends religious service: Not on file    Active member of club or organization: Not on file    Attends meetings of clubs or organizations: Not on file    Relationship status: Not on file  . Intimate partner violence    Fear of current or ex partner: Not on file    Emotionally abused: Not on file    Physically abused: Not on file    Forced sexual activity: Not on file  Other Topics Concern  . Not on file  Social History Narrative  . Not on file     PHYSICAL EXAM  Vitals:   06/04/19 1100  BP: 100/65  Pulse: 73  Temp: (!) 97.5 F (36.4 C)  SpO2: 98%  Weight: 199 lb (90.3 kg)  Height: 5\' 7"  (1.702 m)    Body mass index is 31.17 kg/m.   General: The patient is well-developed and well-nourished and in no acute distress.   No rashes or edema     Neurologic Exam  Mental status: The patient is alert and oriented x 3 at the time of the examination. The patient has apparent normal recent and remote memory, with an apparently normal attention span and concentration ability.   Speech is normal.  Cranial nerves: Extraocular movements are full.   Facial strength and sensation was normal.  Trapezius strength was normal.  Motor:  Muscle bulk and tone are normal.  Strength is 5/5  Sensory: Sensory testing is intact  to touch in all 4 extremities.  Coordination: Cerebellar testing shows normal finger-nose-finger..  Gait and station: Station is normal.  Gait is slightly wide.  Tandem gait is wide.  Romberg is negative.  Reflexes: Deep tendon reflexes are symmetric and normal bilaterally.     DIAGNOSTIC DATA (LABS, IMAGING, TESTING) - I reviewed patient records, labs, notes, testing and imaging myself where available.  Lab Results  Component Value Date   WBC 3.6 11/16/2018   HGB 12.5 11/16/2018   HCT 37.1 11/16/2018   MCV 92 11/16/2018   PLT 230 11/16/2018      Component Value Date/Time   NA 136 12/30/2017 2327   K 3.4 (L) 12/30/2017 2327   CL 99 (  L) 12/30/2017 2327   CO2 27 12/30/2017 2327   GLUCOSE 146 (H) 12/30/2017 2327   BUN 18 12/30/2017 2327   CREATININE 0.72 12/30/2017 2327   CALCIUM 9.2 12/30/2017 2327   PROT 7.7 12/30/2017 2327   ALBUMIN 4.2 12/30/2017 2327   AST 23 12/30/2017 2327   ALT 31 12/30/2017 2327   ALKPHOS 88 12/30/2017 2327   BILITOT 0.9 12/30/2017 2327   GFRNONAA >60 12/30/2017 2327   GFRAA >60 12/30/2017 2327       ASSESSMENT AND PLAN    1. Multiple sclerosis (HCC)   2. Gait disturbance   3. Insomnia, unspecified type   4. Chronic fatigue     1.   Continue Tecfidera. Check CBC --- she had a reading of 0.4 but the next reading was 0.9 but then next reading 0.6.  Consider Aubagio if she has another low lymphocyte readings.   2.   Continue Provigil and Adderall.   3.   As gait is usually fine, she will not qualify for a power wheelchair.  She may want to buy a used scooter for longer distances.  This will also be more portable to transfer without getting more equipment 4.   She will return to see me in 4 -5 months or sooner if she has new or worsening symptoms.   Richard A. Felecia Shelling, MD, PhD 03/07/5276, 82:42 AM Certified in Neurology, Clinical Neurophysiology, Sleep Medicine, Pain Medicine and Neuroimaging  [facility] [address],  [place], [place] [address] [phone]

## 2019-06-05 ENCOUNTER — Telehealth: Payer: Self-pay | Admitting: *Deleted

## 2019-06-05 LAB — CBC WITH DIFFERENTIAL/PLATELET
Basophils Absolute: 0.1 x10E3/uL (ref 0.0–0.2)
Basos: 1 %
EOS (ABSOLUTE): 0.1 x10E3/uL (ref 0.0–0.4)
Eos: 3 %
Hematocrit: 39.3 % (ref 34.0–46.6)
Hemoglobin: 13.4 g/dL (ref 11.1–15.9)
Immature Grans (Abs): 0 x10E3/uL (ref 0.0–0.1)
Immature Granulocytes: 0 %
Lymphocytes Absolute: 0.9 x10E3/uL (ref 0.7–3.1)
Lymphs: 20 %
MCH: 31.4 pg (ref 26.6–33.0)
MCHC: 34.1 g/dL (ref 31.5–35.7)
MCV: 92 fL (ref 79–97)
Monocytes Absolute: 0.5 x10E3/uL (ref 0.1–0.9)
Monocytes: 11 %
Neutrophils Absolute: 3 x10E3/uL (ref 1.4–7.0)
Neutrophils: 65 %
Platelets: 251 x10E3/uL (ref 150–450)
RBC: 4.27 x10E6/uL (ref 3.77–5.28)
RDW: 12.9 % (ref 11.7–15.4)
WBC: 4.6 x10E3/uL (ref 3.4–10.8)

## 2019-06-05 LAB — HEPATIC FUNCTION PANEL
ALT: 16 IU/L (ref 0–32)
AST: 12 IU/L (ref 0–40)
Albumin: 4.8 g/dL (ref 3.8–4.9)
Alkaline Phosphatase: 89 IU/L (ref 39–117)
Bilirubin Total: 0.8 mg/dL (ref 0.0–1.2)
Bilirubin, Direct: 0.18 mg/dL (ref 0.00–0.40)
Total Protein: 6.8 g/dL (ref 6.0–8.5)

## 2019-06-05 NOTE — Telephone Encounter (Signed)
-----   Message from Britt Bottom, MD sent at 06/05/2019  2:10 PM EDT ----- Please let the patient know that the lab work is fine.  The lymphocytes were back at 0.9.  (Our goal is 0.7 or above)

## 2019-06-05 NOTE — Telephone Encounter (Signed)
Called and spoke with pt about lab results per Dr. Sater note. Pt verbalized understanding.  

## 2019-06-20 ENCOUNTER — Other Ambulatory Visit: Payer: Self-pay | Admitting: Neurology

## 2019-06-20 MED ORDER — AMPHETAMINE-DEXTROAMPHETAMINE 10 MG PO TABS: ORAL_TABLET | ORAL | 0 refills | Status: DC

## 2019-06-20 NOTE — Telephone Encounter (Signed)
Pt is needing a refill on her amphetamine-dextroamphetamine (ADDERALL) 10 MG tablet sent to the [facility] on [address]

## 2019-07-06 ENCOUNTER — Other Ambulatory Visit: Payer: Self-pay | Admitting: Neurology

## 2019-07-17 ENCOUNTER — Other Ambulatory Visit: Payer: Self-pay | Admitting: *Deleted

## 2019-07-17 DIAGNOSIS — G35 Multiple sclerosis: Secondary | ICD-10-CM

## 2019-07-17 MED ORDER — TECFIDERA 240 MG PO CPDR: DELAYED_RELEASE_CAPSULE | ORAL | 11 refills | Status: DC

## 2019-07-18 ENCOUNTER — Other Ambulatory Visit: Payer: Self-pay | Admitting: Neurology

## 2019-07-18 MED ORDER — AMPHETAMINE-DEXTROAMPHETAMINE 10 MG PO TABS: ORAL_TABLET | ORAL | 0 refills | Status: DC

## 2019-07-18 NOTE — Telephone Encounter (Signed)
Pt is needing a refill on her amphetamine-dextroamphetamine (ADDERALL) 10 MG tablet sent to [facility] on [address]

## 2019-07-18 NOTE — Telephone Encounter (Signed)
Pt last seen 06/04/19. Drug registry checked. She last refilled rx adderall 10mg  06/21/19 #90. She can fill on or after 07/21/19

## 2019-07-29 ENCOUNTER — Other Ambulatory Visit: Payer: Self-pay | Admitting: Neurology

## 2019-07-29 MED ORDER — AMPHETAMINE-DEXTROAMPHETAMINE 10 MG PO TABS: ORAL_TABLET | ORAL | 0 refills | Status: DC

## 2019-07-29 NOTE — Telephone Encounter (Signed)
Called pt to further discuss. She states her pharmacy: [facility] in [place], [place] only had 10 tabs in stock and unsure when they will get more. They called around and found that [place] has some in stock. Advised I will need to check with MD to make sure we can send there for #80 since she already picked up #10. I will only call back if we are unable to do so. She verbalized understanding.

## 2019-07-29 NOTE — Telephone Encounter (Signed)
Pt is requesting a refill of amphetamine-dextroamphetamine (ADDERALL) 10 MG tablet  , to be sent to [place], [place]

## 2019-07-29 NOTE — Telephone Encounter (Signed)
Spoke with Dr. Felecia Shelling, he is ok to provide new rx to [facility] #80. Sent request to MD to escribe

## 2019-07-29 NOTE — Telephone Encounter (Signed)
Checked drug registry. She last refilled rx 07/26/19 #10. Rx was sent 07/18/19 to be filled on or after 07/21/19 for #90. Unsure why pt only got #10?

## 2019-08-26 ENCOUNTER — Other Ambulatory Visit: Payer: Self-pay | Admitting: Neurology

## 2019-08-26 MED ORDER — AMPHETAMINE-DEXTROAMPHETAMINE 10 MG PO TABS: ORAL_TABLET | ORAL | 0 refills | Status: DC

## 2019-08-26 NOTE — Telephone Encounter (Signed)
Pt is requesting a refill of amphetamine-dextroamphetamine (ADDERALL) 10 MG tablet , to be sent to [place], [place]

## 2019-09-02 ENCOUNTER — Other Ambulatory Visit: Payer: Self-pay | Admitting: Neurology

## 2019-09-09 DIAGNOSIS — E782 Mixed hyperlipidemia: Secondary | ICD-10-CM | POA: Diagnosis not present

## 2019-09-09 DIAGNOSIS — G35 Multiple sclerosis: Secondary | ICD-10-CM | POA: Diagnosis not present

## 2019-09-09 DIAGNOSIS — Z23 Encounter for immunization: Secondary | ICD-10-CM | POA: Diagnosis not present

## 2019-09-09 DIAGNOSIS — I10 Essential (primary) hypertension: Secondary | ICD-10-CM | POA: Diagnosis not present

## 2019-09-09 DIAGNOSIS — F320 Major depressive disorder, single episode, mild: Secondary | ICD-10-CM | POA: Diagnosis not present

## 2019-09-09 DIAGNOSIS — Z87891 Personal history of nicotine dependence: Secondary | ICD-10-CM | POA: Diagnosis not present

## 2019-09-23 ENCOUNTER — Other Ambulatory Visit: Payer: Self-pay | Admitting: Neurology

## 2019-09-23 MED ORDER — AMPHETAMINE-DEXTROAMPHETAMINE 10 MG PO TABS: ORAL_TABLET | ORAL | 0 refills | Status: DC

## 2019-09-23 NOTE — Telephone Encounter (Signed)
1) Medication(s) Requested (by name): adderall 2) Pharmacy of Choice:  [facility] [address]

## 2019-09-23 NOTE — Telephone Encounter (Signed)
[place] drug registry has been verified. Last refill was 08/26/2019 # 90 for a 30 day supply.  Pt has been compliant with her f/u's.

## 2019-09-25 ENCOUNTER — Telehealth: Payer: Self-pay | Admitting: Neurology

## 2019-09-25 MED ORDER — TECFIDERA 240 MG PO CPDR: DELAYED_RELEASE_CAPSULE | ORAL | 3 refills | Status: DC

## 2019-09-25 NOTE — Telephone Encounter (Signed)
Rx has been sent for a 90 day supply of Tecfidera.

## 2019-09-25 NOTE — Telephone Encounter (Signed)
Angie with [facility] called to request a 90 day supply of the patients TECFIDERA 240 MG CPDR  For it to be 3 refills qty of 180  Please follow up.

## 2019-10-29 ENCOUNTER — Other Ambulatory Visit: Payer: Self-pay | Admitting: Neurology

## 2019-10-29 MED ORDER — AMPHETAMINE-DEXTROAMPHETAMINE 10 MG PO TABS: ORAL_TABLET | ORAL | 0 refills | Status: DC

## 2019-10-29 NOTE — Telephone Encounter (Signed)
Pt is needing a refill on her amphetamine-dextroamphetamine (ADDERALL) 10 MG tablet sent in to the [facility] on [address] in [facility]

## 2019-10-29 NOTE — Telephone Encounter (Signed)
Checked drug registry. She last refilled adderall 10mg  09/25/2019 #90. Last seen 06/04/2019

## 2019-11-12 ENCOUNTER — Other Ambulatory Visit: Payer: Self-pay | Admitting: *Deleted

## 2019-11-12 ENCOUNTER — Telehealth: Payer: Self-pay | Admitting: Neurology

## 2019-11-12 MED ORDER — FOLBEE 2.5-25-1 MG PO TABS: 1.0000 | ORAL_TABLET | Freq: Every day | ORAL | 0 refills | Status: DC

## 2019-11-12 MED ORDER — MODAFINIL 200 MG PO TABS: ORAL_TABLET | ORAL | 1 refills | Status: DC

## 2019-11-12 MED ORDER — FLUOXETINE HCL 40 MG PO CAPS: 40.0000 mg | ORAL_CAPSULE | Freq: Every day | ORAL | 0 refills | Status: DC

## 2019-11-12 MED ORDER — MELOXICAM 15 MG PO TABS: 15.0000 mg | ORAL_TABLET | Freq: Every day | ORAL | 0 refills | Status: DC

## 2019-11-12 MED ORDER — GABAPENTIN 300 MG PO CAPS: ORAL_CAPSULE | ORAL | 0 refills | Status: DC

## 2019-11-12 MED ORDER — DULOXETINE HCL 60 MG PO CPEP: 60.0000 mg | ORAL_CAPSULE | Freq: Every day | ORAL | 0 refills | Status: DC

## 2019-11-12 NOTE — Telephone Encounter (Signed)
Pt is requesting a refill of LORazepam (ATIVAN) 1 MG tablet , to be sent to [facility] [place],  - [address] 

## 2019-11-12 NOTE — Telephone Encounter (Signed)
Checked drug registry. She last refilled 10/07/2019 #30. Last seen 06/04/19 and has no f/u scheduled. Dr. Epimenio Foot wanted to see her back in 4-5 months.   I called pt and scheduled f/u for 11/14/2019 at 1pm with Dr. Epimenio Foot.  Advised rx lorazepam last sent 05/23/2019 #30, 5 refills. She should have enough until 11/23/19. She checked her rx bottle and noticed she has one more refill. She will contact pharmacy.

## 2019-11-14 ENCOUNTER — Ambulatory Visit: Payer: Self-pay | Admitting: Neurology

## 2019-12-02 ENCOUNTER — Other Ambulatory Visit: Payer: Self-pay

## 2019-12-02 MED ORDER — AMPHETAMINE-DEXTROAMPHETAMINE 10 MG PO TABS: ORAL_TABLET | ORAL | 0 refills | Status: DC

## 2019-12-02 NOTE — Addendum Note (Signed)
Addended by: Hillis Range on: 12/02/2019 04:09 PM   Modules accepted: Orders

## 2019-12-02 NOTE — Telephone Encounter (Signed)
1) Medication(s) Requested (by name): amphetamine-dextroamphetamine (ADDERALL) 10 MG tablet  2) Pharmacy of Choice: [facility] [address]

## 2019-12-02 NOTE — Telephone Encounter (Signed)
1) Medication(s) Requested (by name):  amphetamine-dextroamphetamine (ADDERALL) 10 MG tablet  2) Pharmacy [facility] Choice:  [facility] [address]   Pharmacy does not have the qty she needs

## 2019-12-03 ENCOUNTER — Telehealth: Payer: Self-pay | Admitting: *Deleted

## 2019-12-03 NOTE — Telephone Encounter (Addendum)
Called pt because I received message from [facility] that she called requesting steroids. She has been having severe pain in left shoulder, muscle spasms in fingers, fatigue. She is requesting IV steroids prior to appt at 4pm tomorrow because she is aware they close at a certain time. She is experiencing flushing with Tecfidera within the last three days. This happened when she was first on it but not since, she is concerned. I advised it would be best for Dr. Epimenio Foot to see her first but I will send message to Dr. Epimenio Foot to see if anything else can be done.  I spoke with Dr. Epimenio Foot and he is ok to bring her in at 3pm tomorrow for IV solumedrol 1Gx1 day. I provided orders to [facility]. Pt will come tomorrow at 2:30pm for infusion.

## 2019-12-04 ENCOUNTER — Encounter: Payer: Self-pay | Admitting: Neurology

## 2019-12-04 ENCOUNTER — Other Ambulatory Visit: Payer: Self-pay

## 2019-12-04 ENCOUNTER — Ambulatory Visit (INDEPENDENT_AMBULATORY_CARE_PROVIDER_SITE_OTHER): Payer: PPO | Admitting: Neurology

## 2019-12-04 VITALS — BP 124/81 | HR 77 | Temp 97.1°F | Ht 67.0 in | Wt 207.5 lb

## 2019-12-04 DIAGNOSIS — Z79899 Other long term (current) drug therapy: Secondary | ICD-10-CM

## 2019-12-04 DIAGNOSIS — R5382 Chronic fatigue, unspecified: Secondary | ICD-10-CM | POA: Diagnosis not present

## 2019-12-04 DIAGNOSIS — E559 Vitamin D deficiency, unspecified: Secondary | ICD-10-CM | POA: Diagnosis not present

## 2019-12-04 DIAGNOSIS — G35 Multiple sclerosis: Secondary | ICD-10-CM | POA: Diagnosis not present

## 2019-12-04 MED ORDER — MELOXICAM 15 MG PO TABS: 15.0000 mg | ORAL_TABLET | Freq: Every day | ORAL | 3 refills | Status: DC

## 2019-12-04 MED ORDER — FLUOXETINE HCL 40 MG PO CAPS: 40.0000 mg | ORAL_CAPSULE | Freq: Every day | ORAL | 3 refills | Status: DC

## 2019-12-04 MED ORDER — DULOXETINE HCL 60 MG PO CPEP: 60.0000 mg | ORAL_CAPSULE | Freq: Every day | ORAL | 3 refills | Status: DC

## 2019-12-04 MED ORDER — MODAFINIL 200 MG PO TABS: ORAL_TABLET | ORAL | 1 refills | Status: DC

## 2019-12-04 MED ORDER — GABAPENTIN 300 MG PO CAPS: ORAL_CAPSULE | ORAL | 3 refills | Status: DC

## 2019-12-04 NOTE — Progress Notes (Signed)
[facility]  PATIENT: Veronica Frazier DOB: [birth year]  REFERRING CLINICIAN: Harlene Ramus HISTORY FROM: patient REASON FOR VISIT: MS   HISTORICAL  CHIEF COMPLAINT:  Chief Complaint  Patient presents with  . Follow-up    RM 12, alone. Received 1G IV solumedrol today prior to appt.   . Multiple Sclerosis    On Tecfidera    HISTORY OF PRESENT ILLNESS:  Veronica Frazier is a 54 y.o. woman with relapsing remitting MS and related symptoms.    Update 12/04/2019: She feels her MS is stable but her fatigue is much worse.  She is on Tecfidera.   She has noted some flushing recently (had the first month but then none for a long time).   She has had flushing a few times the last month.  Last lymphocyte count was 0.9.     She doesn't always take after food.   She also has had more left shoulder pain.  She is noting more spasms down the left arm into the hand.    She is walking well and has no falls.   She has hand numbness and tingling, worse in cold temperatures.    Gabapentin helps.    Modafinil helps fatigue incompletely.    She has had some word finding issues at times.    Update 06/04/2019: MS is stable.  She is on Tecfidera and tolerates it well.   Lymphocyte counts have fluctuated between 0.6 and 0.9Gait is better than last year.   She denies much weakness or numbness but her strength fatigues easily, especially in heat.  She was having a lot of falls after walking longer distances and sometimes even in her home last year and this year, breaking her shoulder with one fall.    She is doing better now.    She needed to rent scooters when she went out wit her family.   With Covid she is doing less outside.   We discussed options.  An electric wheelchair was recommended by her PT.  However, she only requires assistance for short distances  MRI shows extensive white matter change - some looking more c/w MS and others more consistent with small vessel ischemic changes.    She has HTN but not DM.  She used to smoke.     Update 11/16/18: She is on Tecfidera and tolerates  It well.   She denies exacerbations but has had some falls due to weakness in her left side as the day goes on.    Her left side is weaker than her right.   She notes no spasticity.  In general, her gait worsens as the day goes on and she has had frequent falls especially in the late afternoons and evenings.  She was evaluated by PT and had a wheelchair evaluation.   Hand strength is fine.   She feels she is clumsy and drops items.     Vision is ok Bladder is doing well.  Her main problem is fatigue.   She gets a benefit from modafinil and Adderall but feels it wears off by the afternoon.    She takes Provigil 100 mg in am and 100 mg in mid afternoon and Adderall 10 mg po qAM and again around noon and around 3 pm.     Mood is doing well.   She notes some verbal fluency issues and some reduced orientation around day of the week.    She is sleeping well most nights but has one x  nocturia but quickly falls asleep  Update 06/06/2018: She has not noted any exacerbations and feels her MS is mostly stable.  She is on Tecfidera 240 mg twice daily and tolerates it fairly well.  Last MRI of her brain, 09/01/2017, did not show any new findings.  Many foci are nonspecific, so she appears to have a mixture of both demyelinating and chronic microvascular ischemic change foci  She feels her gait is worse due to left ankle swelling.   She wears a brace.   Pain is throbbing there.     She has more difficulties with fatigue even when she is taking phentermine and Provigil.  She currently takes Provigil 300 mg daily.  Im the past, IV steroid helped some.   She notes insomnia and has difficulty staying asleep more than falling asleep. She often is awake in the middle of the night.    She takes lorazepam.  Doxepin caused her to be drowsy the next day.   She has had some difficulties with depression and anxiety but feels this is  stable.  She is on duloxetine and fluoxetine.   She feels her mood is stable.    Update to 02/17/2018: She feels her MS has been stable. She is on Tecfidera and tolerates it well. She denies any recent issues with flushing or GI symptoms.   She notes no change in her gait, strength or sensation since last visit. Bladder function is doing well. She has had some blurry vision but no significant change since the last visit.   The MRI of the brain 09/01/2017 did not show any acute findings. Has many T2/FLAIR hyperintense foci. A little more consistent with MS and other look more consistent with chronic microvascular ischemic change. Her homocystine was elevated so she was started on l-methylfolate. She cannot afford it as it is not covered by her insurance.  She has difficulty with fatigue and has had some daytime sleepiness.   Phentermine and Provigil have helped sleepiness.  She stopped doxepin for insomnia due to a hangover the next day.  She is using an OTC sleep aid and does better with it.    Mood is better.   She remains on Duloxetine and fluoxetine.      From 05/31/2017: MS:  As a disease modifying therapy, she is on Tecfidera. She tolerates it well. Initially she had mild GI symptoms but these no longer occur. She does not have flushing.    She is noting more blurry vision and worse STM.   Gait/strength/sensation:   Her balance and gait are stable.   She has had a few falls but usually if she trips.   She tires out quickly with walking.  She has more trouble inside and outside her home.     She has mild right arm dysesthesia   Vision:   She notes more blurred vision that has been gradual.    Depth perception is often off.   Vision is worse if she has a headache.    Bladder:  She denies much difficulties with her bladder. Bowel is doing better (had colitis).    Pain:   Her back and hand pain has done better this year.      Mood/ Cognition:  She feels depression is mildly worse.    She is going  through a divorce (71 years marriage).   She feels mood is better on duloxetine and fluoxetine.  She has cognitive difficulties, Specifically, she notes verbal fluency and short term  memory.   Fatigue/sleep:  She has difficulty with fatigue and she has some daytime sleepiness. This is usually worse in the heat.   She gets benefit from Provigil (takes 1. To 1.5 pills daily) and phentermine.   She has sleep maintenance worse than sleep onset insomnia.   Lorazepam has helped her fall asleep more than stay asleep.   Flexeril causes a hangover.      MS History:   She presented in 2000  with pain and tingling in the left arm. At that time, she had an MRI of the brain performed that was concerning for multiple sclerosis. Additionally, she had a lumbar puncture performed with CSF that was consistent with MS. Initially, she was placed on Avonex injections weekly. Initially, she did well with the Avonex but then had an MRI that showed some progression. About 2014 or 2015,  she switched to Tecfidera.     REVIEW OF SYSTEMS:  Constitutional: No fevers, chills, sweats, or change in appetite   She has fatigue that has worsened Eyes: Blurry vision and rare diplopia.  No eye pain Ear, nose and throat: No hearing loss, ear pain, nasal congestion, sore throat Cardiovascular: No chest pain, palpitations Respiratory:  No shortness of breath at rest or with exertion.   No wheezes   She snores GastrointestinaI: No nausea but recent diarrhea and constipation.    Colitis being treated Genitourinary:  No dysuria, urinary retention or frequency.  No nocturia. Musculoskeletal:  No neck pain.  Mild axial back pain.   Right arm pain Integumentary: No rash, pruritus, skin lesions Neurological: as above Psychiatric:  She has some depression Endocrine: No palpitations, diaphoresis.     Hematologic/Lymphatic:  No anemia, purpura, petechiae. Allergic/Immunologic: No itchy/runny eyes, nasal congestion, recent allergic reactions,  rashes  ALLERGIES: No Known Allergies  HOME MEDICATIONS: Outpatient Medications Prior to Visit  Medication Sig Dispense Refill  . amphetamine-dextroamphetamine (ADDERALL) 10 MG tablet Take 2 to 3 tablets daily 90 tablet 0  . ASPIRIN 81 PO Take 1 tablet by mouth daily.    Marland Kitchen aspirin-acetaminophen-caffeine (EXCEDRIN MIGRAINE) 250-250-65 MG per tablet Take 1 tablet by mouth 3 (three) times daily as needed.      Marland Kitchen atorvastatin (LIPITOR) 40 MG tablet TAKE 1 TABLET BY MOUTH ONCE DAILY AT NIGHT  3  . cyclobenzaprine (FLEXERIL) 5 MG tablet Take 1 tablet (5 mg total) by mouth at bedtime. (Patient taking differently: Take 5 mg by mouth as needed. ) 30 tablet 5  . Folic Acid-Vit B6-Vit B12 (FOLBEE) 2.5-25-1 MG TABS tablet Take 1 tablet by mouth daily. 90 tablet 0  . hydrochlorothiazide (HYDRODIURIL) 12.5 MG tablet Take 12.5 mg by mouth daily.    Marland Kitchen LORazepam (ATIVAN) 1 MG tablet TAKE 1 TABLET BY MOUTH AT BEDTIME 30 tablet 5  . LOSARTAN POTASSIUM PO Take 0.5 tablets by mouth daily.     . Multiple Vitamins-Calcium (ONE-A-DAY WOMENS FORMULA PO) Take 1 tablet by mouth daily.      Marland Kitchen omeprazole (PRILOSEC) 20 MG capsule     . TECFIDERA 240 MG CPDR Take one capsule twice daily at 10am and 5pm. 180 capsule 3  . VITAMIN D PO Take by mouth.    . DULoxetine (CYMBALTA) 60 MG capsule Take 1 capsule (60 mg total) by mouth daily. 90 capsule 0  . FLUoxetine (PROZAC) 40 MG capsule Take 1 capsule (40 mg total) by mouth daily. 90 capsule 0  . gabapentin (NEURONTIN) 300 MG capsule TAKE 1 CAPSULE BY MOUTH 4 TIMES DAILY  360 capsule 0  . meloxicam (MOBIC) 15 MG tablet Take 1 tablet (15 mg total) by mouth daily. 90 tablet 0  . modafinil (PROVIGIL) 200 MG tablet Take 1/2 pill po tid 135 tablet 1   No facility-administered medications prior to visit.    PAST MEDICAL HISTORY: Past Medical History:  Diagnosis Date  . Hypertension   . Multiple sclerosis (HCC)   . Neuropathy   . Stroke (HCC)   . Vision abnormalities      PAST SURGICAL HISTORY: Past Surgical History:  Procedure Laterality Date  . ABDOMINAL HYSTERECTOMY    . APPENDECTOMY    . BREAST SURGERY    . CHOLECYSTECTOMY    . TONSILLECTOMY      FAMILY HISTORY: Family History  Problem Relation Age of Onset  . Hypertension Mother   . Hyperlipidemia Mother   . Colitis Mother   . Hypertension Father     SOCIAL HISTORY:  Social History   Socioeconomic History  . Marital status: Married    Spouse name: Not on file  . Number of children: Not on file  . Years of education: Not on file  . Highest education level: Not on file  Occupational History  . Not on file  Tobacco Use  . Smoking status: Current Every Day Smoker  . Smokeless tobacco: Never Used  Substance and Sexual Activity  . Alcohol use: No    Alcohol/week: 0.0 standard drinks  . Drug use: No  . Sexual activity: Not on file  Other Topics Concern  . Not on file  Social History Narrative  . Not on file   Social Determinants of Health   Financial Resource Strain:   . Difficulty of Paying Living Expenses: Not on file  Food Insecurity:   . Worried About Programme researcher, broadcasting/film/video in the Last Year: Not on file  . Ran Out of Food in the Last Year: Not on file  Transportation Needs:   . Lack of Transportation (Medical): Not on file  . Lack of Transportation (Non-Medical): Not on file  Physical Activity:   . Days of Exercise per Week: Not on file  . Minutes of Exercise per Session: Not on file  Stress:   . Feeling of Stress : Not on file  Social Connections:   . Frequency of Communication with Friends and Family: Not on file  . Frequency of Social Gatherings with Friends and Family: Not on file  . Attends Religious Services: Not on file  . Active Member of Clubs or Organizations: Not on file  . Attends Banker Meetings: Not on file  . Marital Status: Not on file  Intimate Partner Violence:   . Fear of Current or Ex-Partner: Not on file  . Emotionally Abused:  Not on file  . Physically Abused: Not on file  . Sexually Abused: Not on file     PHYSICAL EXAM  Vitals:   12/04/19 1601  BP: 124/81  Pulse: 77  Temp: (!) 97.1 F (36.2 C)  Weight: 207 lb 8 oz (94.1 kg)  Height: 5\' 7"  (1.702 m)    Body mass index is 32.5 kg/m.   General: The patient is well-developed and well-nourished and in no acute distress.   No rashes or edema     Neurologic Exam  Mental status: The patient is alert and oriented x 3 at the time of the examination. The patient has apparent normal recent and remote memory, with an apparently normal attention span and concentration ability.  Speech is normal.  Cranial nerves: Extraocular movements are full.   Facial strength and sensation was normal.  Trapezius strength was normal.  Motor:  Muscle bulk and tone are normal.  Strength is 5/5  Sensory: Sensory testing is intact to touch in all 4 extremities.  Coordination: Cerebellar testing shows normal finger-nose-finger..  Gait and station: Station is normal.  Gait is slightly wide.  Tandem gait is moderately wide.   Romberg is negative Reflexes: Deep tendon reflexes are symmetric and normal bilaterally.     DIAGNOSTIC DATA (LABS, IMAGING, TESTING) - I reviewed patient records, labs, notes, testing and imaging myself where available.  Lab Results  Component Value Date   WBC 4.6 06/04/2019   HGB 13.4 06/04/2019   HCT 39.3 06/04/2019   MCV 92 06/04/2019   PLT 251 06/04/2019      Component Value Date/Time   NA 136 12/30/2017 2327   K 3.4 (L) 12/30/2017 2327   CL 99 (L) 12/30/2017 2327   CO2 27 12/30/2017 2327   GLUCOSE 146 (H) 12/30/2017 2327   BUN 18 12/30/2017 2327   CREATININE 0.72 12/30/2017 2327   CALCIUM 9.2 12/30/2017 2327   PROT 6.8 06/04/2019 1149   ALBUMIN 4.8 06/04/2019 1149   AST 12 06/04/2019 1149   ALT 16 06/04/2019 1149   ALKPHOS 89 06/04/2019 1149   BILITOT 0.8 06/04/2019 1149   GFRNONAA >60 12/30/2017 2327   GFRAA >60 12/30/2017  2327       ASSESSMENT AND PLAN    1. Multiple sclerosis (HCC)   2. Chronic fatigue   3. Multiple sclerosis, relapsing-remitting (HCC)   4. High risk medication use   5. Vitamin D deficiency     1.   Continue Tecfidera. Check CBC with diff.   Consider Aubagio if  low lymphocyte    2.   Continue Provigil, meloxicam.   3.   Continue antidepressants.  4.   She will return to see me in 4 -5 months or sooner if she has new or worsening symptoms.   Camerin Ladouceur A. Epimenio Foot, MD, PhD 12/04/2019, 4:40 PM Certified in Neurology, Clinical Neurophysiology, Sleep Medicine, Pain Medicine and Neuroimaging  [facility] [address] [place], [place] [address] [phone]

## 2019-12-05 ENCOUNTER — Telehealth: Payer: Self-pay | Admitting: *Deleted

## 2019-12-05 LAB — CBC WITH DIFFERENTIAL/PLATELET
Basophils Absolute: 0 10*3/uL (ref 0.0–0.2)
Basos: 1 %
EOS (ABSOLUTE): 0.1 10*3/uL (ref 0.0–0.4)
Eos: 1 %
Hematocrit: 38.7 % (ref 34.0–46.6)
Hemoglobin: 13.3 g/dL (ref 11.1–15.9)
Immature Grans (Abs): 0 10*3/uL (ref 0.0–0.1)
Immature Granulocytes: 0 %
Lymphocytes Absolute: 1 10*3/uL (ref 0.7–3.1)
Lymphs: 15 %
MCH: 31.5 pg (ref 26.6–33.0)
MCHC: 34.4 g/dL (ref 31.5–35.7)
MCV: 92 fL (ref 79–97)
Monocytes Absolute: 0.2 10*3/uL (ref 0.1–0.9)
Monocytes: 4 %
Neutrophils Absolute: 5.4 10*3/uL (ref 1.4–7.0)
Neutrophils: 79 %
Platelets: 263 10*3/uL (ref 150–450)
RBC: 4.22 x10E6/uL (ref 3.77–5.28)
RDW: 13 % (ref 11.7–15.4)
WBC: 6.8 10*3/uL (ref 3.4–10.8)

## 2019-12-05 LAB — VITAMIN D 25 HYDROXY (VIT D DEFICIENCY, FRACTURES): Vit D, 25-Hydroxy: 57.9 ng/mL (ref 30.0–100.0)

## 2019-12-05 NOTE — Telephone Encounter (Signed)
-----   Message from Asa Lente, MD sent at 12/05/2019  2:53 PM EST ----- Blood counts and vit d look good

## 2019-12-05 NOTE — Telephone Encounter (Signed)
Called and spoke with pt about lab results. Pt verbalized understanding.  

## 2019-12-10 ENCOUNTER — Other Ambulatory Visit: Payer: Self-pay | Admitting: Neurology

## 2019-12-10 MED ORDER — LORAZEPAM 1 MG PO TABS: 1.0000 mg | ORAL_TABLET | Freq: Every day | ORAL | 5 refills | Status: DC

## 2019-12-10 NOTE — Telephone Encounter (Signed)
Pt is requesting a refill of LORazepam (ATIVAN) 1 MG tablet , to be sent to [facility] [address] [place], [place] - [address]

## 2019-12-30 ENCOUNTER — Other Ambulatory Visit: Payer: Self-pay

## 2019-12-30 MED ORDER — AMPHETAMINE-DEXTROAMPHETAMINE 10 MG PO TABS: ORAL_TABLET | ORAL | 0 refills | Status: DC

## 2019-12-30 NOTE — Telephone Encounter (Signed)
Pt called office and left VM requesting a refill on her adderall. Pt is up to date on her appts and is due for a refill. She asked that it be sent to [facility] on [address]. [place] Controlled Substance Registry checked.

## 2020-02-03 ENCOUNTER — Other Ambulatory Visit: Payer: Self-pay | Admitting: Neurology

## 2020-02-03 MED ORDER — AMPHETAMINE-DEXTROAMPHETAMINE 10 MG PO TABS: ORAL_TABLET | ORAL | 0 refills | Status: DC

## 2020-02-03 NOTE — Telephone Encounter (Signed)
Pt called to update pharmacy she would like medication [facility] [address] [place]

## 2020-02-03 NOTE — Telephone Encounter (Signed)
1) Medication(s) Requested (by name): adderrall 2) Pharmacy of Choice:  [facility] [address]

## 2020-02-04 MED ORDER — AMPHETAMINE-DEXTROAMPHETAMINE 10 MG PO TABS: ORAL_TABLET | ORAL | 0 refills | Status: DC

## 2020-02-04 NOTE — Telephone Encounter (Signed)
Pt called to request her adderrall medication be [facility] on [address] [place]

## 2020-02-04 NOTE — Telephone Encounter (Addendum)
Called [facility] and cx rx adderall sent 02/03/20. Spoke with Santina Evans.

## 2020-02-04 NOTE — Addendum Note (Signed)
Addended by: Arther Abbott on: 02/04/2020 11:43 AM   Modules accepted: Orders

## 2020-03-04 ENCOUNTER — Other Ambulatory Visit: Payer: Self-pay | Admitting: Neurology

## 2020-03-04 MED ORDER — AMPHETAMINE-DEXTROAMPHETAMINE 10 MG PO TABS: ORAL_TABLET | ORAL | 0 refills | Status: DC

## 2020-03-04 NOTE — Telephone Encounter (Signed)
Pt is needing a refill on her amphetamine-dextroamphetamine (ADDERALL) 10 MG tablet sent in to [facility]

## 2020-03-05 ENCOUNTER — Other Ambulatory Visit: Payer: Self-pay | Admitting: Neurology

## 2020-03-10 ENCOUNTER — Telehealth: Payer: Self-pay | Admitting: Neurology

## 2020-03-10 NOTE — Telephone Encounter (Signed)
Tried calling pt back. LVM returning her call

## 2020-03-10 NOTE — Telephone Encounter (Signed)
Pt called wanting to know if she can come in for an infusion tomorrow. Please advise.

## 2020-03-11 ENCOUNTER — Other Ambulatory Visit (INDEPENDENT_AMBULATORY_CARE_PROVIDER_SITE_OTHER): Payer: PPO

## 2020-03-11 ENCOUNTER — Other Ambulatory Visit: Payer: Self-pay

## 2020-03-11 ENCOUNTER — Other Ambulatory Visit: Payer: Self-pay | Admitting: *Deleted

## 2020-03-11 DIAGNOSIS — Z79899 Other long term (current) drug therapy: Secondary | ICD-10-CM

## 2020-03-11 DIAGNOSIS — R5382 Chronic fatigue, unspecified: Secondary | ICD-10-CM | POA: Diagnosis not present

## 2020-03-11 DIAGNOSIS — G35 Multiple sclerosis: Secondary | ICD-10-CM

## 2020-03-11 DIAGNOSIS — R5383 Other fatigue: Secondary | ICD-10-CM

## 2020-03-11 DIAGNOSIS — Z0289 Encounter for other administrative examinations: Secondary | ICD-10-CM

## 2020-03-11 NOTE — Telephone Encounter (Signed)
Called, LVM for pt to call office. 

## 2020-03-11 NOTE — Telephone Encounter (Signed)
Called pt again. She will come at 2pm today for IV steroids. Provided signed order to intrafusion. She is aware that he wants labs/UA prior to her getting IV infusion. Orders placed.

## 2020-03-11 NOTE — Telephone Encounter (Signed)
We can do one gram solu-medrol Before IV she needs to get CBC with diff, CMP and UAand UCx

## 2020-03-11 NOTE — Telephone Encounter (Addendum)
Called pt back. She is very fatigued, she has no energy. This has been going on for abbout 2 weeks. She denies any changes in medications or any signs/sx of infection. Advised I will discuss with MD once he gets to the office and I will call her back with his recommendation.

## 2020-03-11 NOTE — Telephone Encounter (Signed)
Dr. Sater- please advise 

## 2020-03-14 LAB — COMPREHENSIVE METABOLIC PANEL
ALT: 23 IU/L (ref 0–32)
AST: 16 IU/L (ref 0–40)
Albumin/Globulin Ratio: 2.3 — ABNORMAL HIGH (ref 1.2–2.2)
Albumin: 4.8 g/dL (ref 3.8–4.9)
Alkaline Phosphatase: 100 IU/L (ref 39–117)
BUN/Creatinine Ratio: 17 (ref 9–23)
BUN: 14 mg/dL (ref 6–24)
Bilirubin Total: 0.9 mg/dL (ref 0.0–1.2)
CO2: 24 mmol/L (ref 20–29)
Calcium: 9.7 mg/dL (ref 8.7–10.2)
Chloride: 101 mmol/L (ref 96–106)
Creatinine, Ser: 0.81 mg/dL (ref 0.57–1.00)
GFR calc Af Amer: 95 mL/min/{1.73_m2} (ref >59)
GFR calc non Af Amer: 82 mL/min/{1.73_m2} (ref >59)
Globulin, Total: 2.1 g/dL (ref 1.5–4.5)
Glucose: 110 mg/dL — ABNORMAL HIGH (ref 65–99)
Potassium: 4.4 mmol/L (ref 3.5–5.2)
Sodium: 140 mmol/L (ref 134–144)
Total Protein: 6.9 g/dL (ref 6.0–8.5)

## 2020-03-14 LAB — CBC WITH DIFFERENTIAL/PLATELET
Basophils Absolute: 0.1 10*3/uL (ref 0.0–0.2)
Basos: 1 %
EOS (ABSOLUTE): 0.2 10*3/uL (ref 0.0–0.4)
Eos: 3 %
Hematocrit: 39.1 % (ref 34.0–46.6)
Hemoglobin: 13.5 g/dL (ref 11.1–15.9)
Immature Grans (Abs): 0 10*3/uL (ref 0.0–0.1)
Immature Granulocytes: 0 %
Lymphocytes Absolute: 0.9 10*3/uL (ref 0.7–3.1)
Lymphs: 16 %
MCH: 31.9 pg (ref 26.6–33.0)
MCHC: 34.5 g/dL (ref 31.5–35.7)
MCV: 92 fL (ref 79–97)
Monocytes Absolute: 0.5 10*3/uL (ref 0.1–0.9)
Monocytes: 10 %
Neutrophils Absolute: 3.9 10*3/uL (ref 1.4–7.0)
Neutrophils: 70 %
Platelets: 259 10*3/uL (ref 150–450)
RBC: 4.23 x10E6/uL (ref 3.77–5.28)
RDW: 12.9 % (ref 11.7–15.4)
WBC: 5.5 10*3/uL (ref 3.4–10.8)

## 2020-03-14 LAB — URINALYSIS, ROUTINE W REFLEX MICROSCOPIC
Bilirubin, UA: NEGATIVE
Glucose, UA: NEGATIVE
Nitrite, UA: NEGATIVE
RBC, UA: NEGATIVE
Specific Gravity, UA: 1.023 (ref 1.005–1.030)
Urobilinogen, Ur: 1 mg/dL (ref 0.2–1.0)
pH, UA: 6.5 (ref 5.0–7.5)

## 2020-03-14 LAB — MICROSCOPIC EXAMINATION
Casts: NONE SEEN /lpf
Epithelial Cells (non renal): >10 /hpf — AB (ref 0–10)

## 2020-03-14 LAB — URINE CULTURE

## 2020-03-16 ENCOUNTER — Telehealth: Payer: Self-pay | Admitting: *Deleted

## 2020-03-16 NOTE — Telephone Encounter (Signed)
Called and spoke with pt about results per Dr. Sater note. Pt verbalized understanding. 

## 2020-03-16 NOTE — Telephone Encounter (Signed)
-----   Message from Asa Lente, MD sent at 03/14/2020  2:06 PM EDT ----- Please let the patient know that the lab work is ok.   Urine culture did not show infection

## 2020-04-02 ENCOUNTER — Other Ambulatory Visit: Payer: Self-pay | Admitting: Neurology

## 2020-04-02 MED ORDER — AMPHETAMINE-DEXTROAMPHETAMINE 10 MG PO TABS: ORAL_TABLET | ORAL | 0 refills | Status: DC

## 2020-04-02 NOTE — Telephone Encounter (Signed)
1) Medication(s) Requested (by name): amphetamine-dextroamphetamine (ADDERALL) 10 MG tablet   2) Pharmacy of Choice:  [facility] - [place], [place] - [address]  [address], [place] [address]

## 2020-04-30 ENCOUNTER — Other Ambulatory Visit: Payer: Self-pay

## 2020-04-30 MED ORDER — AMPHETAMINE-DEXTROAMPHETAMINE 10 MG PO TABS: ORAL_TABLET | ORAL | 0 refills | Status: DC

## 2020-04-30 NOTE — Telephone Encounter (Signed)
Pt is up to date on her appts. Pt is due for a refill on adderall. [place] Controlled Substance Registry checked and is appropriate. Will send to the work in MD in Dr. Bonnita Hollow absence.

## 2020-05-01 ENCOUNTER — Other Ambulatory Visit: Payer: Self-pay | Admitting: Neurology

## 2020-05-01 NOTE — Progress Notes (Signed)
I returned phone call from [facility]ist from [facility] in [facility] regarding a prescription for Adderall for this patient given by Dr. Vickey Huger yesterday her DEA number was inactive and hence pharmacist could not fill the prescription.  I have authorized the pharmacist to prescribe under my DEA number which I gave to him over the phone for a 9090 tablets of Adderall with no refill.

## 2020-06-04 ENCOUNTER — Other Ambulatory Visit: Payer: Self-pay | Admitting: Neurology

## 2020-06-04 DIAGNOSIS — Z1231 Encounter for screening mammogram for malignant neoplasm of breast: Secondary | ICD-10-CM | POA: Diagnosis not present

## 2020-06-04 MED ORDER — AMPHETAMINE-DEXTROAMPHETAMINE 10 MG PO TABS: ORAL_TABLET | ORAL | 0 refills | Status: DC

## 2020-06-04 NOTE — Telephone Encounter (Signed)
Pt request refill amphetamine-dextroamphetamine (ADDERALL) 10 MG tablet at [facility]

## 2020-06-08 ENCOUNTER — Other Ambulatory Visit: Payer: Self-pay | Admitting: Neurology

## 2020-06-08 NOTE — Telephone Encounter (Signed)
Per last office visit, continue Provigil. Pt has pending appt on 06/15/20. Per [place] registry, patient last filled on 03/06/2020 Modafinil 200 Mg Tablet #135.0 90 day supply by Dr. Epimenio Foot. Sending Rx to Dr. Epimenio Foot to e-scribe.

## 2020-06-15 ENCOUNTER — Ambulatory Visit: Payer: PPO | Admitting: Neurology

## 2020-06-16 ENCOUNTER — Other Ambulatory Visit: Payer: Self-pay | Admitting: Neurology

## 2020-06-16 NOTE — Telephone Encounter (Signed)
Pt is up to date on her appts and has an appt scheduled for 09/23/2020. Pt is due for a refill on lorazepam.  [place] Controlled Substance Registry checked and is appropriate.

## 2020-07-07 ENCOUNTER — Other Ambulatory Visit: Payer: Self-pay

## 2020-07-07 ENCOUNTER — Telehealth: Payer: Self-pay

## 2020-07-07 DIAGNOSIS — G35 Multiple sclerosis: Secondary | ICD-10-CM

## 2020-07-07 NOTE — Telephone Encounter (Signed)
Called, LVM returning pt's call 

## 2020-07-07 NOTE — Telephone Encounter (Signed)
Pt left a VM asking for call to discuss getting an infusion for an MS flare up.

## 2020-07-07 NOTE — Telephone Encounter (Signed)
Pt returning phone call ° °

## 2020-07-07 NOTE — Addendum Note (Signed)
Addended by: Arther Abbott on: 07/07/2020 04:30 PM   Modules accepted: Orders

## 2020-07-07 NOTE — Telephone Encounter (Signed)
Called pt back to further discuss. Pt states sx started this past Sat. She reports it started off feeling like she had a pinched nerve in left lower back. She denies any injuries prior to sx starting. States sx have gradually worsened.  Has constant numbness in both legs/left arm. It was intermittent to begin with. Denies starting any new meds. Denies any signs/symptoms of infection. Confirmed she is still taking Tecifdera as prescribed. Advised I will discuss with MD and call her back about next steps. Pt last seen 12/04/19 and next f/u 09/23/20

## 2020-07-07 NOTE — Telephone Encounter (Signed)
Spoke with Dr. Epimenio Foot. He would like pt to come for 2 days IV steroids (1G each day). Per infusion suite, they can fit her in tomorrow morning at 8am. Provided signed orders to infusion suite. Dr. Epimenio Foot would also like to order updated MRI brain and cervical w/wo.  I called pt. Relayed above info. Pt agreed to 8am for infusion. She prefers to use [facility] in [place], [place] to get MRI's completed. I placed orders.

## 2020-07-07 NOTE — Telephone Encounter (Signed)
Pt left a VM asking for a refill on adderall sent to [facility].

## 2020-07-08 ENCOUNTER — Telehealth: Payer: Self-pay | Admitting: Neurology

## 2020-07-08 DIAGNOSIS — G35 Multiple sclerosis: Secondary | ICD-10-CM | POA: Diagnosis not present

## 2020-07-08 MED ORDER — AMPHETAMINE-DEXTROAMPHETAMINE 10 MG PO TABS: ORAL_TABLET | ORAL | 0 refills | Status: DC

## 2020-07-08 NOTE — Telephone Encounter (Signed)
Health team order faxed to [facility] they will reach out to the patient to schedule. Ph # [phone] & fax # [phone].

## 2020-07-08 NOTE — Addendum Note (Signed)
Addended by: Arther Abbott on: 07/08/2020 06:55 AM   Modules accepted: Orders

## 2020-07-09 DIAGNOSIS — G35 Multiple sclerosis: Secondary | ICD-10-CM | POA: Diagnosis not present

## 2020-08-10 ENCOUNTER — Other Ambulatory Visit: Payer: Self-pay

## 2020-08-10 ENCOUNTER — Encounter: Payer: Self-pay | Admitting: Neurology

## 2020-08-10 ENCOUNTER — Ambulatory Visit: Payer: PPO | Admitting: Neurology

## 2020-08-10 ENCOUNTER — Telehealth: Payer: Self-pay | Admitting: *Deleted

## 2020-08-10 VITALS — BP 109/70 | HR 81 | Ht 67.0 in | Wt 196.5 lb

## 2020-08-10 DIAGNOSIS — G35 Multiple sclerosis: Secondary | ICD-10-CM

## 2020-08-10 DIAGNOSIS — R5383 Other fatigue: Secondary | ICD-10-CM

## 2020-08-10 DIAGNOSIS — R269 Unspecified abnormalities of gait and mobility: Secondary | ICD-10-CM

## 2020-08-10 DIAGNOSIS — Z79899 Other long term (current) drug therapy: Secondary | ICD-10-CM

## 2020-08-10 DIAGNOSIS — M47812 Spondylosis without myelopathy or radiculopathy, cervical region: Secondary | ICD-10-CM | POA: Diagnosis not present

## 2020-08-10 MED ORDER — LORAZEPAM 1 MG PO TABS
1.0000 mg | ORAL_TABLET | Freq: Every day | ORAL | 5 refills | Status: DC
Start: 2020-08-10 — End: 2021-02-15

## 2020-08-10 MED ORDER — AMPHETAMINE-DEXTROAMPHETAMINE 10 MG PO TABS: ORAL_TABLET | ORAL | 0 refills | Status: DC

## 2020-08-10 NOTE — Telephone Encounter (Signed)
Called pt because she was on wait list for any cx. She accepted appt today at 1pm with Dr. Epimenio Foot. Asked she check in by 12:50p at the latest. She has MRI appt in [place] at 2:30p. She also is requesting refill on Adderall. Advised Dr. Epimenio Foot can address this at her follow up today

## 2020-08-10 NOTE — Progress Notes (Signed)
[facility]  PATIENT: Veronica Frazier DOB: 07-29-1966  REFERRING CLINICIAN: Harlene Ramus HISTORY FROM: patient REASON FOR VISIT: MS   HISTORICAL  CHIEF COMPLAINT:  Chief Complaint  Patient presents with  . Follow-up    RM 12, alone. Last seen 12/04/2019.Has MRI scheduled in [facility], [place] today at 2:30pm. needs refill for adderall sent to [facility].   . Multiple Sclerosis    On Tecfidera    HISTORY OF PRESENT ILLNESS:  Veronica Frazier is a 54 y.o. woman with relapsing remitting MS and related symptoms.    Update 08/10/2020: She feels her MS is stable.   No exacerbations.  She is on Tecfidera.   No recent flushing or stomach upset..  Last lymphocyte count was 0.9 5/12/20211      She is walking well for the most part but fell twice when she tripped on items.   She sometimes veers to the left and sometimes hits the door frames. Legs are strong.    She has mild hand numbness and tingling and usually notes more in colder weather.    Gabapentin helps.      She note mild fatigue, better than last visit.   Usually she does best in spring and fall.   Modafinil helps fatigue.    She notes mild cognitive issues with decreased memory and word finding issues at times.    She has had the Pfizer vaccination  And the booster last month      MS History:   She presented in 2000  with pain and tingling in the left arm. At that time, she had an MRI of the brain performed that was concerning for multiple sclerosis. Additionally, she had a lumbar puncture performed with CSF that was consistent with MS. Initially, she was placed on Avonex injections weekly. Initially, she did well with the Avonex but then had an MRI that showed some progression. About 2014 or 2015,  she switched to Tecfidera.    MRI of the brain 09/01/2017 showed multiple T2/FLAIR hyperintense foci in the periventricular, subcortical and deep white matter of both hemispheres and in the pons. Although most of  the foci are nonspecific, some are radially oriented to the ventricles and others are in the juxtacortical white matter, consistent with chronic demyelinating plaque associated with multiple sclerosis. None of the foci appears to be acute and they do not enhance.   REVIEW OF SYSTEMS:  Constitutional: No fevers, chills, sweats, or change in appetite   She has fatigue that has worsened Eyes: Blurry vision and rare diplopia.  No eye pain Ear, nose and throat: No hearing loss, ear pain, nasal congestion, sore throat Cardiovascular: No chest pain, palpitations Respiratory:  No shortness of breath at rest or with exertion.   No wheezes   She snores GastrointestinaI: No nausea but recent diarrhea and constipation.    Colitis being treated Genitourinary:  No dysuria, urinary retention or frequency.  No nocturia. Musculoskeletal:  No neck pain.  Mild axial back pain.   Right arm pain Integumentary: No rash, pruritus, skin lesions Neurological: as above Psychiatric:  She has some depression Endocrine: No palpitations, diaphoresis.     Hematologic/Lymphatic:  No anemia, purpura, petechiae. Allergic/Immunologic: No itchy/runny eyes, nasal congestion, recent allergic reactions, rashes  ALLERGIES: No Known Allergies  HOME MEDICATIONS: Outpatient Medications Prior to Visit  Medication Sig Dispense Refill  . ASPIRIN 81 PO Take 1 tablet by mouth daily.    Marland Kitchen aspirin-acetaminophen-caffeine (EXCEDRIN MIGRAINE) 250-250-65 MG per tablet  Take 1 tablet by mouth 3 (three) times daily as needed.      Marland Kitchen atorvastatin (LIPITOR) 40 MG tablet TAKE 1 TABLET BY MOUTH ONCE DAILY AT NIGHT  3  . cyclobenzaprine (FLEXERIL) 5 MG tablet Take 1 tablet (5 mg total) by mouth at bedtime. (Patient taking differently: Take 5 mg by mouth as needed. ) 30 tablet 5  . DULoxetine (CYMBALTA) 60 MG capsule Take 1 capsule (60 mg total) by mouth daily. 90 capsule 3  . FLUoxetine (PROZAC) 40 MG capsule Take 1 capsule (40 mg total) by mouth  daily. 90 capsule 3  . FOLBEE 2.5-25-1 MG TABS tablet Take 1 tablet by mouth daily (Patient needs to call and schedule follow up for ongoing refills) 90 tablet 1  . gabapentin (NEURONTIN) 300 MG capsule TAKE 1 CAPSULE BY MOUTH 4 TIMES DAILY 360 capsule 3  . hydrochlorothiazide (HYDRODIURIL) 12.5 MG tablet Take 12.5 mg by mouth daily.    Marland Kitchen LOSARTAN POTASSIUM PO Take 0.5 tablets by mouth daily.     . meloxicam (MOBIC) 15 MG tablet Take 1 tablet (15 mg total) by mouth daily. 90 tablet 3  . modafinil (PROVIGIL) 200 MG tablet Take 1/2 tablet by mouth 3 times a day 135 tablet 0  . Multiple Vitamins-Calcium (ONE-A-DAY WOMENS FORMULA PO) Take 1 tablet by mouth daily.      Marland Kitchen omeprazole (PRILOSEC) 20 MG capsule     . TECFIDERA 240 MG CPDR Take one capsule twice daily at 10am and 5pm. 180 capsule 3  . VITAMIN D PO Take by mouth.    Marland Kitchen amphetamine-dextroamphetamine (ADDERALL) 10 MG tablet Take 2 to 3 tablets daily 90 tablet 0  . LORazepam (ATIVAN) 1 MG tablet TAKE 1 TABLET BY MOUTH ONCE DAILY AT BEDTIME 30 tablet 5   No facility-administered medications prior to visit.    PAST MEDICAL HISTORY: Past Medical History:  Diagnosis Date  . Hypertension   . Multiple sclerosis (HCC)   . Neuropathy   . Stroke (HCC)   . Vision abnormalities     PAST SURGICAL HISTORY: Past Surgical History:  Procedure Laterality Date  . ABDOMINAL HYSTERECTOMY    . APPENDECTOMY    . BREAST SURGERY    . CHOLECYSTECTOMY    . TONSILLECTOMY      FAMILY HISTORY: Family History  Problem Relation Age of Onset  . Hypertension Mother   . Hyperlipidemia Mother   . Colitis Mother   . Hypertension Father     SOCIAL HISTORY:  Social History   Socioeconomic History  . Marital status: Married    Spouse name: Not on file  . Number of children: Not on file  . Years of education: Not on file  . Highest education level: Not on file  Occupational History  . Not on file  Tobacco Use  . Smoking status: Current Every Day  Smoker  . Smokeless tobacco: Never Used  Substance and Sexual Activity  . Alcohol use: No    Alcohol/week: 0.0 standard drinks  . Drug use: No  . Sexual activity: Not on file  Other Topics Concern  . Not on file  Social History Narrative   Right handed    Social Determinants of Health   Financial Resource Strain:   . Difficulty of Paying Living Expenses: Not on file  Food Insecurity:   . Worried About Programme researcher, broadcasting/film/video in the Last Year: Not on file  . Ran Out of Food in the Last Year: Not on file  Transportation Needs:   . Freight forwarder (Medical): Not on file  . Lack of Transportation (Non-Medical): Not on file  Physical Activity:   . Days of Exercise per Week: Not on file  . Minutes of Exercise per Session: Not on file  Stress:   . Feeling of Stress : Not on file  Social Connections:   . Frequency of Communication with Friends and Family: Not on file  . Frequency of Social Gatherings with Friends and Family: Not on file  . Attends Religious Services: Not on file  . Active Member of Clubs or Organizations: Not on file  . Attends Banker Meetings: Not on file  . Marital Status: Not on file  Intimate Partner Violence:   . Fear of Current or Ex-Partner: Not on file  . Emotionally Abused: Not on file  . Physically Abused: Not on file  . Sexually Abused: Not on file     PHYSICAL EXAM  Vitals:   08/10/20 1257  BP: 109/70  Pulse: 81  Weight: 196 lb 8 oz (89.1 kg)  Height: 5\' 7"  (1.702 m)    Body mass index is 30.78 kg/m.   General: The patient is well-developed and well-nourished and in no acute distress.   No rashes or edema     Neurologic Exam  Mental status: The patient is alert and oriented x 3 at the time of the examination. The patient has apparent normal recent and remote memory, with an apparently normal attention span and concentration ability.   Speech is normal.  Cranial nerves: Extraocular movements are full.   Facial strength  and sensation was normal.  Trapezius strength was normal.  Motor:  Muscle bulk and tone are normal.  Strength is 5/5  Sensory: Intact sensation to touch and vibration in the arms and legs.  Coordination: Cerebellar testing shows normal finger-nose-finger..  Gait and station: Station is normal.  Gait is slightly wide.  Tandem gait is moderately wide.   Romberg is negative  Reflexes: Deep tendon reflexes are symmetric and normal bilaterally.     DIAGNOSTIC DATA (LABS, IMAGING, TESTING) - I reviewed patient records, labs, notes, testing and imaging myself where available.  Lab Results  Component Value Date   WBC 5.5 03/11/2020   HGB 13.5 03/11/2020   HCT 39.1 03/11/2020   MCV 92 03/11/2020   PLT 259 03/11/2020      Component Value Date/Time   NA 140 03/11/2020 1409   K 4.4 03/11/2020 1409   CL 101 03/11/2020 1409   CO2 24 03/11/2020 1409   GLUCOSE 110 (H) 03/11/2020 1409   GLUCOSE 146 (H) 12/30/2017 2327   BUN 14 03/11/2020 1409   CREATININE 0.81 03/11/2020 1409   CALCIUM 9.7 03/11/2020 1409   PROT 6.9 03/11/2020 1409   ALBUMIN 4.8 03/11/2020 1409   AST 16 03/11/2020 1409   ALT 23 03/11/2020 1409   ALKPHOS 100 03/11/2020 1409   BILITOT 0.9 03/11/2020 1409   GFRNONAA 82 03/11/2020 1409   GFRAA 95 03/11/2020 1409       ASSESSMENT AND PLAN    1. Multiple sclerosis (HCC)   2. High risk medication use   3. Gait disturbance   4. Fatigue, unspecified type     1.   Continue Tecfidera. Check CBC with diff and LFT.   She will be having an MRI later today to determine if subclinical activity and consider a different DMT if this is occurring.  Consider Aubagio if  low lymphocyte  2.   Continue Provigil, meloxicam.   3.   Continue antidepressants.  4.   She will return to see me in 6 months or sooner if she has new or worsening symptoms.   Armoni Kludt A. Epimenio Foot, MD, PhD 08/10/2020, 1:18 PM Certified in Neurology, Clinical Neurophysiology, Sleep Medicine, Pain Medicine  and Neuroimaging  [facility] [address] [place], [place] [address] [phone]

## 2020-08-11 ENCOUNTER — Telehealth: Payer: Self-pay | Admitting: *Deleted

## 2020-08-11 LAB — CBC WITH DIFFERENTIAL/PLATELET
Basophils Absolute: 0 10*3/uL (ref 0.0–0.2)
Basos: 1 %
EOS (ABSOLUTE): 0.1 10*3/uL (ref 0.0–0.4)
Eos: 2 %
Hematocrit: 37.6 % (ref 34.0–46.6)
Hemoglobin: 12.7 g/dL (ref 11.1–15.9)
Immature Grans (Abs): 0 10*3/uL (ref 0.0–0.1)
Immature Granulocytes: 0 %
Lymphocytes Absolute: 0.9 10*3/uL (ref 0.7–3.1)
Lymphs: 19 %
MCH: 31.6 pg (ref 26.6–33.0)
MCHC: 33.8 g/dL (ref 31.5–35.7)
MCV: 94 fL (ref 79–97)
Monocytes Absolute: 0.4 10*3/uL (ref 0.1–0.9)
Monocytes: 9 %
Neutrophils Absolute: 3.5 10*3/uL (ref 1.4–7.0)
Neutrophils: 69 %
Platelets: 233 10*3/uL (ref 150–450)
RBC: 4.02 x10E6/uL (ref 3.77–5.28)
RDW: 12.5 % (ref 11.7–15.4)
WBC: 5 10*3/uL (ref 3.4–10.8)

## 2020-08-11 LAB — HEPATIC FUNCTION PANEL
ALT: 20 IU/L (ref 0–32)
AST: 16 IU/L (ref 0–40)
Albumin: 4.3 g/dL (ref 3.8–4.9)
Alkaline Phosphatase: 93 IU/L (ref 44–121)
Bilirubin Total: 0.7 mg/dL (ref 0.0–1.2)
Bilirubin, Direct: 0.15 mg/dL (ref 0.00–0.40)
Total Protein: 6.4 g/dL (ref 6.0–8.5)

## 2020-08-11 NOTE — Telephone Encounter (Signed)
Called, LVM for pt about results per Dr. Epimenio Foot note. Provided office number if she has further questions. Advised she did not have to return call otherwise.

## 2020-08-11 NOTE — Telephone Encounter (Signed)
-----   Message from Asa Lente, MD sent at 08/11/2020 10:56 AM EDT ----- Please let the patient know that the lab work is fine.

## 2020-08-12 ENCOUNTER — Telehealth: Payer: Self-pay | Admitting: *Deleted

## 2020-08-12 NOTE — Telephone Encounter (Signed)
Called and spoke with pt about results per Dr. Epimenio Foot note. She verbalized understanding and appreciation. She wants to know how many lesions she has overall. Advised I will send message to MD and call her back with his response.

## 2020-08-12 NOTE — Telephone Encounter (Signed)
-----   Message from Asa Lente, MD sent at 08/11/2020  6:17 PM EDT ----- Regarding: MRI Please let Veronica Frazier know that I reviewed the MRI images from this week and compared it to the MRI of the brain from 2018.  The MRI of the brain is unchanged.  She has no new lesions.  The MRI of the cervical spine is normal for age.  There are no MS lesions in the spinal cord.  There are only minimal disc degenerative changes and no nerve root compression

## 2020-08-24 ENCOUNTER — Encounter: Payer: Self-pay | Admitting: Family Medicine

## 2020-09-08 ENCOUNTER — Other Ambulatory Visit: Payer: Self-pay | Admitting: Neurology

## 2020-09-08 MED ORDER — AMPHETAMINE-DEXTROAMPHETAMINE 10 MG PO TABS
ORAL_TABLET | ORAL | 0 refills | Status: DC
Start: 2020-09-08 — End: 2020-10-07

## 2020-09-08 NOTE — Telephone Encounter (Signed)
Pt request refill amphetamine-dextroamphetamine (ADDERALL) 10 MG tablet at [facility] 

## 2020-09-15 ENCOUNTER — Other Ambulatory Visit: Payer: Self-pay | Admitting: *Deleted

## 2020-09-15 ENCOUNTER — Other Ambulatory Visit: Payer: Self-pay | Admitting: Neurology

## 2020-09-23 ENCOUNTER — Ambulatory Visit: Payer: PPO | Admitting: Neurology

## 2020-09-29 ENCOUNTER — Other Ambulatory Visit: Payer: Self-pay | Admitting: *Deleted

## 2020-09-29 MED ORDER — TECFIDERA 240 MG PO CPDR: DELAYED_RELEASE_CAPSULE | ORAL | 3 refills | Status: DC

## 2020-10-07 ENCOUNTER — Other Ambulatory Visit: Payer: Self-pay | Admitting: Neurology

## 2020-10-07 MED ORDER — AMPHETAMINE-DEXTROAMPHETAMINE 10 MG PO TABS
ORAL_TABLET | ORAL | 0 refills | Status: DC
Start: 2020-10-07 — End: 2020-10-08

## 2020-10-07 NOTE — Telephone Encounter (Signed)
Pt request refill amphetamine-dextroamphetamine (ADDERALL) 10 MG tablet at [facility]

## 2020-10-08 ENCOUNTER — Other Ambulatory Visit: Payer: Self-pay | Admitting: Neurology

## 2020-10-08 MED ORDER — AMPHETAMINE-DEXTROAMPHETAMINE 10 MG PO TABS
ORAL_TABLET | ORAL | 0 refills | Status: DC
Start: 2020-10-08 — End: 2020-11-04

## 2020-10-08 NOTE — Telephone Encounter (Signed)
Pt called stating that her amphetamine-dextroamphetamine (ADDERALL) 10 MG tablet was sent to the wrong pharmacy and she is needing to pick it up tonight due to leaving for [place] for a funeral and she will be gone for quite a while. Please resend prescription to [facility]

## 2020-10-08 NOTE — Telephone Encounter (Signed)
Called pt and informed her rx called in by Dr. Epimenio Foot to correct pharmacy. She verbalized understanding.

## 2020-11-04 ENCOUNTER — Other Ambulatory Visit: Payer: Self-pay | Admitting: Neurology

## 2020-11-04 ENCOUNTER — Telehealth: Payer: Self-pay | Admitting: Neurology

## 2020-11-04 MED ORDER — AMPHETAMINE-DEXTROAMPHETAMINE 10 MG PO TABS
ORAL_TABLET | ORAL | 0 refills | Status: DC
Start: 2020-11-04 — End: 2020-12-07

## 2020-11-04 NOTE — Addendum Note (Signed)
Addended by: Arther Abbott on: 11/04/2020 10:44 AM   Modules accepted: Orders

## 2020-11-04 NOTE — Telephone Encounter (Signed)
Pt called, having some fatigue related to my MS. Can Dr. Epimenio Foot put in a order for me to get an infusion. Would like a call from the nurse.

## 2020-11-04 NOTE — Telephone Encounter (Signed)
Pt is requesting a refill for amphetamine-dextroamphetamine (ADDERALL) 10 MG tablet.  Pharmacy: [facility]  (pt stated now new insurance with new year.)

## 2020-11-04 NOTE — Telephone Encounter (Signed)
LVM returning pt call. 

## 2020-11-04 NOTE — Telephone Encounter (Signed)
LVM for pt again.

## 2020-11-11 ENCOUNTER — Telehealth: Payer: Self-pay | Admitting: Neurology

## 2020-11-11 DIAGNOSIS — G35 Multiple sclerosis: Secondary | ICD-10-CM | POA: Diagnosis not present

## 2020-11-11 NOTE — Telephone Encounter (Signed)
Pt called needing to speak to the RN to discuss an infusion. Please advise.

## 2020-11-11 NOTE — Telephone Encounter (Signed)
Called pt back. Having extreme fatigue. Was having back pain yesterday but it has resolved today. Denies any infection. Denies any changes in medications. I placed her on hold. MD approved 1G IV solumedrol. Liane can fit pt in after 12pm. Pt will be here at 3pm. Gave completed/signed orders to [facility].

## 2020-11-17 ENCOUNTER — Other Ambulatory Visit: Payer: Self-pay | Admitting: *Deleted

## 2020-11-17 MED ORDER — GABAPENTIN 300 MG PO CAPS: ORAL_CAPSULE | ORAL | 3 refills | Status: DC

## 2020-12-04 ENCOUNTER — Other Ambulatory Visit: Payer: Self-pay | Admitting: Neurology

## 2020-12-04 NOTE — Telephone Encounter (Signed)
Pt. is requesting a refill for amphetamine-dextroamphetamine (ADDERALL) 10 MG tablet.  Pharmacy: [facility]  

## 2020-12-07 MED ORDER — AMPHETAMINE-DEXTROAMPHETAMINE 10 MG PO TABS: ORAL_TABLET | ORAL | 0 refills | Status: DC

## 2020-12-07 NOTE — Addendum Note (Signed)
Addended by: Arther Abbott on: 12/07/2020 07:43 AM   Modules accepted: Orders

## 2020-12-18 ENCOUNTER — Other Ambulatory Visit: Payer: Self-pay | Admitting: Neurology

## 2020-12-18 DIAGNOSIS — J0100 Acute maxillary sinusitis, unspecified: Secondary | ICD-10-CM | POA: Diagnosis not present

## 2020-12-18 MED ORDER — MODAFINIL 200 MG PO TABS: ORAL_TABLET | ORAL | 0 refills | Status: DC

## 2020-12-18 NOTE — Telephone Encounter (Signed)
Pt is needing a refill on her modafinil (PROVIGIL) 200 MG tablet sent in to the [facility]

## 2021-01-05 ENCOUNTER — Other Ambulatory Visit: Payer: Self-pay | Admitting: Neurology

## 2021-01-05 MED ORDER — AMPHETAMINE-DEXTROAMPHETAMINE 10 MG PO TABS: ORAL_TABLET | ORAL | 0 refills | Status: DC

## 2021-01-05 NOTE — Addendum Note (Signed)
Addended by: Arther Abbott on: 01/05/2021 01:37 PM   Modules accepted: Orders

## 2021-01-05 NOTE — Telephone Encounter (Signed)
Pt. is requesting a refill for amphetamine-dextroamphetamine (ADDERALL) 10 MG tablet.  Pharmacy: [facility]

## 2021-01-13 ENCOUNTER — Other Ambulatory Visit: Payer: Self-pay | Admitting: Neurology

## 2021-01-26 ENCOUNTER — Other Ambulatory Visit: Payer: Self-pay | Admitting: Neurology

## 2021-01-26 MED ORDER — MODAFINIL 200 MG PO TABS: ORAL_TABLET | ORAL | 0 refills | Status: DC

## 2021-01-26 NOTE — Telephone Encounter (Addendum)
Per Dr. Epimenio Foot, can do a one time refill for #75. We can not refill early again in the future. This is a one time occurrence. I called pt to let her know. Advised she may need to use goodrx coupon since it may not be covered by insurance since she is filling early. She verbalized understanding.

## 2021-01-26 NOTE — Telephone Encounter (Signed)
Called pt back. Advised per drug registry, it appears she got #135 12/18/20. She states this was incorrect. She got #60 initially because [facility] did not have enough and said they owed her 75 tablets. When she went to follow up with them, they initially could not find where they owed her medicine but ended up finding note where they owed her and shipped out medicine. They told her they delivered it to someone on 12/31/20 and no signature required. She does not have medication. Advised  I will speak w/ MD and call back.

## 2021-01-26 NOTE — Telephone Encounter (Signed)
Pt request refill   modafinil (PROVIGIL) 200 MG tablet. Sent to [facility]

## 2021-02-08 ENCOUNTER — Ambulatory Visit: Payer: PPO | Admitting: Family Medicine

## 2021-02-08 ENCOUNTER — Other Ambulatory Visit: Payer: Self-pay

## 2021-02-08 ENCOUNTER — Other Ambulatory Visit: Payer: Self-pay | Admitting: Neurology

## 2021-02-08 ENCOUNTER — Telehealth: Payer: Self-pay | Admitting: Neurology

## 2021-02-08 MED ORDER — AMPHETAMINE-DEXTROAMPHETAMINE 10 MG PO TABS: ORAL_TABLET | ORAL | 0 refills | Status: DC

## 2021-02-08 NOTE — Telephone Encounter (Signed)
Pt left VM prior to this call requesting same thing. Request routed to MD already, pending approval.

## 2021-02-08 NOTE — Telephone Encounter (Signed)
Patient left a voicemail this morning requesting a refill on her Adderall sent to [facility].

## 2021-02-08 NOTE — Addendum Note (Signed)
Addended by: Arther Abbott on: 02/08/2021 10:52 AM   Modules accepted: Orders

## 2021-02-08 NOTE — Telephone Encounter (Signed)
Pt called for a refill on her amphetamine-dextroamphetamine (ADDERALL) 10 MG tablet sent in to the [facility]

## 2021-02-15 ENCOUNTER — Other Ambulatory Visit: Payer: Self-pay | Admitting: Neurology

## 2021-02-17 ENCOUNTER — Other Ambulatory Visit: Payer: Self-pay

## 2021-02-17 ENCOUNTER — Ambulatory Visit: Payer: PPO | Admitting: Family Medicine

## 2021-02-17 ENCOUNTER — Encounter: Payer: Self-pay | Admitting: Family Medicine

## 2021-02-17 VITALS — BP 119/79 | HR 87 | Ht 67.0 in | Wt 203.0 lb

## 2021-02-17 DIAGNOSIS — G35 Multiple sclerosis: Secondary | ICD-10-CM | POA: Diagnosis not present

## 2021-02-17 DIAGNOSIS — R296 Repeated falls: Secondary | ICD-10-CM

## 2021-02-17 DIAGNOSIS — R5382 Chronic fatigue, unspecified: Secondary | ICD-10-CM

## 2021-02-17 DIAGNOSIS — E559 Vitamin D deficiency, unspecified: Secondary | ICD-10-CM

## 2021-02-17 DIAGNOSIS — Z79899 Other long term (current) drug therapy: Secondary | ICD-10-CM

## 2021-02-17 DIAGNOSIS — G4700 Insomnia, unspecified: Secondary | ICD-10-CM

## 2021-02-17 DIAGNOSIS — R269 Unspecified abnormalities of gait and mobility: Secondary | ICD-10-CM

## 2021-02-17 DIAGNOSIS — R208 Other disturbances of skin sensation: Secondary | ICD-10-CM | POA: Diagnosis not present

## 2021-02-17 NOTE — Patient Instructions (Addendum)
Below is our plan:  We will continue current treatment plan. I will update labs, today. Please consider using an assistive device for prevention of falls. Wear foot drop brace as directed. Consider PT for gait assessment if needed.   Please make sure you are staying well hydrated. I recommend 50-60 ounces daily. Well balanced diet and regular exercise encouraged. Consistent sleep schedule with 6-8 hours recommended.   Please continue follow up with care team as directed.   Follow up with Dr Felecia Shelling in 6 months   You may receive a survey regarding today's visit. I encourage you to leave honest feed back as I do use this information to improve patient care. Thank you for seeing me today!    Fall Prevention in the Home, Adult Falls can cause injuries and can happen to people of all ages. There are many things you can do to make your home safe and to help prevent falls. Ask for help when making these changes. What actions can I take to prevent falls? General Instructions  Use good lighting in all rooms. Replace any light bulbs that burn out.  Turn on the lights in dark areas. Use night-lights.  Keep items that you use often in easy-to-reach places. Lower the shelves around your home if needed.  Set up your furniture so you have a clear path. Avoid moving your furniture around.  Do not have throw rugs or other things on the floor that can make you trip.  Avoid walking on wet floors.  If any of your floors are uneven, fix them.  Add color or contrast paint or tape to clearly mark and help you see: ? Grab bars or handrails. ? First and last steps of staircases. ? Where the edge of each step is.  If you use a stepladder: ? Make sure that it is fully opened. Do not climb a closed stepladder. ? Make sure the sides of the stepladder are locked in place. ? Ask someone to hold the stepladder while you use it.  Know where your pets are when moving through your home. What can I do in the  bathroom?  Keep the floor dry. Clean up any water on the floor right away.  Remove soap buildup in the tub or shower.  Use nonskid mats or decals on the floor of the tub or shower.  Attach bath mats securely with double-sided, nonslip rug tape.  If you need to sit down in the shower, use a plastic, nonslip stool.  Install grab bars by the toilet and in the tub and shower. Do not use towel bars as grab bars.      What can I do in the bedroom?  Make sure that you have a light by your bed that is easy to reach.  Do not use any sheets or blankets for your bed that hang to the floor.  Have a firm chair with side arms that you can use for support when you get dressed. What can I do in the kitchen?  Clean up any spills right away.  If you need to reach something above you, use a step stool with a grab bar.  Keep electrical cords out of the way.  Do not use floor polish or wax that makes floors slippery. What can I do with my stairs?  Do not leave any items on the stairs.  Make sure that you have a light switch at the top and the bottom of the stairs.  Make sure that  there are handrails on both sides of the stairs. Fix handrails that are broken or loose.  Install nonslip stair treads on all your stairs.  Avoid having throw rugs at the top or bottom of the stairs.  Choose a carpet that does not hide the edge of the steps on the stairs.  Check carpeting to make sure that it is firmly attached to the stairs. Fix carpet that is loose or worn. What can I do on the outside of my home?  Use bright outdoor lighting.  Fix the edges of walkways and driveways and fix any cracks.  Remove anything that might make you trip as you walk through a door, such as a raised step or threshold.  Trim any bushes or trees on paths to your home.  Check to see if handrails are loose or broken and that both sides of all steps have handrails.  Install guardrails along the edges of any raised  decks and porches.  Clear paths of anything that can make you trip, such as tools or rocks.  Have leaves, snow, or ice cleared regularly.  Use sand or salt on paths during winter.  Clean up any spills in your garage right away. This includes grease or oil spills. What other actions can I take?  Wear shoes that: ? Have a low heel. Do not wear high heels. ? Have rubber bottoms. ? Feel good on your feet and fit well. ? Are closed at the toe. Do not wear open-toe sandals.  Use tools that help you move around if needed. These include: ? Canes. ? Walkers. ? Scooters. ? Crutches.  Review your medicines with your doctor. Some medicines can make you feel dizzy. This can increase your chance of falling. Ask your doctor what else you can do to help prevent falls. Where to find more information  Centers for Disease Control and Prevention, [employer]AD[employer]: http://www.wolf.info/  National Institute on Aging: http://kim-miller.com/ Contact a doctor if:  You are afraid of falling at home.  You feel weak, drowsy, or dizzy at home.  You fall at home. Summary  There are many simple things that you can do to make your home safe and to help prevent falls.  Ways to make your home safe include removing things that can make you trip and installing grab bars in the bathroom.  Ask for help when making these changes in your home. This information is not intended to replace advice given to you by your health care provider. Make sure you discuss any questions you have with your health care provider. Document Revised: 05/20/2020 Document Reviewed: 05/20/2020 Elsevier Patient Education  2021 [place].   Chronic Fatigue Syndrome Chronic fatigue syndrome (CFS) is a condition that causes extreme tiredness (fatigue). This condition is also known as myalgic encephalomyelitis (ME). The fatigue in CFS does not improve with rest, and it gets worse with physical or mental activity. Several other symptoms may occur along with  fatigue. Symptoms may come and go, but they generally last for months. Sometimes, CFS gets better over time. In other cases, it can be a lifelong condition. There is no cure, but there are many possible treatments. You will need to work with your health care providers to find a treatment plan that works best for you. What are the causes? The cause of this condition is not known. CFS may be caused by a combination of things. Possible causes include:  An infection.  An abnormal body defense system (abnormal immune system).  Low  blood pressure.  Poor diet.  Living with a lot of physical or emotional stress. What increases the risk? The following factors may make you more likely to develop this condition:  Being female.  Being 50-4 years old.  Having a family history of CFS. What are the signs or symptoms? The main symptom of this condition is fatigue that is severe enough to interfere with day-to-day activities. This fatigue does not get better with rest, and it gets worse with physical or mental activity. There are eight other major symptoms of CFS:  Discomfort and lack of energy (malaise) that lasts more than 24 hours after physical activity.  Sleep that does not relieve fatigue (unrefreshing sleep).  Short-term memory loss or confusion.  Joint pain without redness or swelling.  Muscle aches.  Headaches.  Painful and swollen glands (lymph nodes) in the neck or under the arms.  Sore throat. Other symptoms can include:  Cramps in the abdomen, constipation, or diarrhea (irritable bowel syndrome).  Chills and night sweats.  Vision changes.  Dizziness and mental confusion (brain fog).  Clumsiness.  Sensitivity to food, noise, or odors.  Mood swings, depression, or anxiety attacks. How is this diagnosed? There are no tests that can diagnose this condition. Your health care provider will make the diagnosis based on:  Your symptoms and medical history.  A physical  exam and a mental health exam.  Tests to rule out other conditions. It is important to make sure that your symptoms are not caused by another medical condition. Tests may include lab tests or X-rays. For your health care provider to diagnose CFS:  You must have had fatigue for at least 6 straight months.  Fatigue must be your first symptom, and it must be severe enough to interfere with day-to-day activities.  You must also have at least four of the eight other major symptoms of CFS.  There must be no other cause found for the fatigue. How is this treated? There is no cure for CFS. The condition affects everyone differently. You will need to work with your team of health care providers to find the best treatments for your symptoms. Your team may include your primary care provider, physical and exercise therapists, and mental health therapists. Treatment may include:  Having a regular bedtime routine to help improve your sleep.  Avoiding caffeine, alcohol, and tobacco or nicotine products.  Doing light exercise and stretching during the day. You may also want to try movement exercises, such as yoga or tai chi.  Taking medicines to help you sleep or to relieve joint or muscle pain.  Learning and practicing relaxation techniques, such as deep breathing and muscle relaxation.  Using memory aids or doing brainteasers to improve memory and concentration.  Getting care for your body and mental well-being, such as: ? Seeing a mental health therapist to evaluate and treat depression, if necessary. ? Cognitive behavioral therapy (CBT). This therapy changes the way you think or act in response to the fatigue. This may help improve how you feel. ? Trying massage therapy and acupuncture.   Follow these instructions at home: Eating and drinking  Avoid caffeine and alcohol.  Avoid heavy meals in the evening.  Eat a healthy diet that includes foods such as vegetables, fruits, fish, and lean  meats.   Activity  Rest as told by your health care provider.  Avoid fatigue by pacing yourself during the day and getting enough sleep at night.  Exercise regularly, as told by your health  care provider.  Go to bed and get up at the same time every day. Lifestyle  Ask your health care provider whether you should keep a diary. Your health care provider will tell you what information to write in the diary. This may include when you have fatigue and how medicines and other behaviors or treatments help to reduce the fatigue.  Consider joining a CFS support group.  Avoid stress and use stress-reducing techniques that you learn in therapy. General instructions  Take over-the-counter and prescription medicines only as told by your health care provider.  Do not use herbal or dietary supplements unless they are approved by your health care provider.  Maintain a healthy weight.  Do not use any products that contain nicotine or tobacco, such as cigarettes, e-cigarettes, and chewing tobacco. If you need help quitting, ask your health care provider.  Keep all follow-up visits as told by your health care provider. This is important.   Where to find more information Get more information or find a support group near you at one of these links:  American Myalgic Encephalomyelitis and Chronic Fatigue Syndrome Society: ammes.org  Centers for Disease Control and Prevention: http://www.wolf.info/ Contact a health care provider if:  Your symptoms do not get better or they get worse.  You feel angry, guilty, anxious, or depressed. Get help right away if:  You have thoughts of self-harm. If you ever feel like you may hurt yourself or others, or have thoughts about taking your own life, get help right away. Go to your nearest emergency department or:  Call your local emergency services (911 in the [place]  Call a suicide crisis helpline, such as the [facility] at 408 586 6054.  This is open 24 hours a day in the [place]  Text the Crisis Text Line at 519 104 6061 (in the [place].). Summary  Chronic fatigue syndrome (CFS) is a condition that causes extreme tiredness (fatigue). This fatigue does not improve with rest, and it gets worse with physical or mental activity.  There is no cure for CFS. The condition affects everyone differently. You will need to work with your team of health care providers to find the best treatments for your symptoms.  Exercise regularly, as told by your health care provider. Avoid stress and use stress-reducing techniques that you learn in therapy.  Contact a health care provider if your symptoms do not get better or they get worse. This information is not intended to replace advice given to you by your health care provider. Make sure you discuss any questions you have with your health care provider. Document Revised: 09/30/2019 Document Reviewed: 09/30/2019 Elsevier Patient Education  2021 [place].   Multiple Sclerosis Multiple sclerosis (MS) is a disease of the brain, spinal cord, and optic nerves (central nervous system). It causes the body's disease-fighting (immune) system to destroy the protective covering (myelin sheath) around nerves in the brain. When this happens, signals (nerve impulses) going to and from the brain and spinal cord do not get sent properly or may not get sent at all. There are several types of MS:  Relapsing-remitting MS. This is the most common type. This causes sudden attacks of symptoms. After an attack, you may recover completely until the next attack, or some symptoms may remain permanently.  Secondary progressive MS. This usually develops after the onset of relapsing-remitting MS. Similar to relapsing-remitting MS, this type also causes sudden attacks of symptoms. Attacks may be less frequent, but symptoms slowly get worse (progress) over time.  Primary progressive MS. This causes symptoms that steadily progress  over time. This type of MS does not cause sudden attacks of symptoms. The age of onset of MS varies, but it often develops between 6-83 years of age. MS is a lifelong (chronic) condition. There is no cure, but treatment can help slow down the progression of the disease. What are the causes? The cause of this condition is not known. What increases the risk? You are more likely to develop this condition if:  You are a woman.  You have a relative with MS. However, the condition is not passed from parent to child (inherited).  You have a lack (deficiency) of vitamin D.  You smoke. MS is more common in the [place] than in the [place]. What are the signs or symptoms? Relapsing-remitting and secondary progressive MS cause symptoms to occur in episodes or attacks that may last weeks to months. There may be long periods between attacks in which there are almost no symptoms. Primary progressive MS causes symptoms to steadily progress after they develop. Symptoms of MS vary because of the many different ways it affects the central nervous system. The main symptoms include:  Vision problems and eye pain.  Numbness and weakness.  Inability to move your arms, hands, feet, or legs (paralysis).  Balance problems.  Shaking that you cannot control (tremors).  Muscle spasms.  Problems with thinking (cognitive changes). MS can also cause symptoms that are associated with the disease, but are not always the direct result of an MS attack. They may include:  Inability to control urination or bowel movements (incontinence).  Headaches.  Fatigue.  Inability to tolerate heat.  Emotional changes.  Depression.  Pain. How is this diagnosed? This condition is diagnosed based on:  Your symptoms.  A neurological exam. This involves checking central nervous system function, such as nerve function, reflexes, and coordination.  MRIs of the brain and spinal  cord.  Lab tests, including a lumbar puncture that tests the fluid that surrounds the brain and spinal cord (cerebrospinal fluid).  Tests to measure the electrical activity of the brain in response to stimulation (evoked potentials). How is this treated? There is no cure for MS, but medicines can help decrease the number and frequency of attacks and help relieve nuisance symptoms. Treatment options may include:  Medicines that reduce the frequency of attacks. These medicines may be given by injection, by mouth (orally), or through an IV.  Medicines that reduce inflammation (steroids). These may provide short-term relief of symptoms.  Medicines to help control pain, depression, fatigue, or incontinence.  Nutritional counseling. Vitamin D supplements, if you have a deficiency.  Using devices to help you move around (assistive devices), such as braces, a cane, or a walker.  Physical therapy to strengthen and stretch your muscles.  Occupational therapy to help you with everyday tasks.  Alternative or complementary treatments such as exercise, massage, or acupuncture.   Follow these instructions at home:  Take over-the-counter and prescription medicines only as told by your health care provider.  Do not drive or use heavy machinery while taking prescription pain medicine.  Use assistive devices as recommended by your physical therapist or your health care provider.  Exercise as directed by your health care provider.  Eating healthy can help manage MS symptoms.  Return to your normal activities as told by your health care provider. Ask your health care provider what activities are safe for you.  Reach out for support. Share  your feelings with friends, family, or a support group.  Keep all follow-up visits as told by your health care provider and therapists. This is important. Where to find more information  National Multiple Sclerosis Society:  https://www.nationalmssociety.[place] of Neurological Disorders and Stroke: http://hendricks-barton.net/  [employer]: GourmetRating.dk Contact a health care provider if:  You feel depressed.  You develop new pain or numbness.  You have tremors.  You have problems with sexual function. Get help right away if:  You develop paralysis.  You develop numbness.  You have problems with your bladder or bowel function.  You develop double vision.  You lose vision in one or both eyes.  You develop suicidal thoughts.  You develop severe confusion. If you ever feel like you may hurt yourself or others, or have thoughts about taking your own life, get help right away. You can go to your nearest emergency department or call:  Your local emergency services (911 in the [place]  A suicide crisis helpline, such as the [facility] at (818) 140-9161. This is open 24 hours a day. Summary  Multiple sclerosis (MS) is a disease of the central nervous system that causes the body's immune system to destroy the protective covering (myelin sheath) around nerves in the brain.  There are 3 types of MS: relapsing-remitting, secondary progressive, and primary progressive. Relapsing-remitting and secondary progressive MS cause symptoms to occur in episodes or attacks that may last weeks to months. Primary progressive MS causes symptoms to steadily progress after they develop.  There is no cure for MS, but medicines can help decrease the number and frequency of attacks and help relieve nuisance symptoms. Treatment may also include physical or occupational therapy.  If you develop numbness, paralysis, vision problems, or other neurological symptoms, get help right away. This information is not intended to replace advice given to you by your health care provider. Make sure you discuss any questions you have with your  health care provider. Document Revised: 07/28/2020 Document Reviewed: 07/28/2020 Elsevier Patient Education  2021 [employer].

## 2021-02-17 NOTE — Progress Notes (Signed)
Chief Complaint  Patient presents with  . Follow-up    RM 2  alone PT is well, MS is pretty stable but she is more fatigued and has fallen more lately. She had a bad fall about a week ago and falls about 2-3 times a week     HISTORY OF PRESENT ILLNESS: 02/17/21 ALL:  Veronica Frazier is a 55 y.o. 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 Labs stable 07/2020. MRI 07/2020 was stable.   MS is fairly stable. No new symptoms. She continues to have difficulty with gait. No weakness of legs. She trips over things. She says that she can climb a roof and do just fine but will trip going down the driveway. PT in the past. She does not use an assistive device. She feels assistive device will not help. She was fitted for foot drop brace but does not wear it. She has not noticed any concerns of foot drop. She feels that she just doesn't pay attention to what she is doing.   Gabapentin 300mg  QID helps with mild hand numbness/tingling. She denies feeling dizzy or lightheaded. She does not feel that medications contribute to falls. She takes meloxicam 15mg  daily for arthritic pain.   Mood is stable on fluoxetine 40mg  and duloxetine 60mg  daily. She is tolerating medications well. She is taking both modafinil 100mg  TID and Adderall 10mg  2-3 times daily for fatigue and concerns of fatigue and mild cognitive issues. She has difficulty with short term memory loss and word finding. She is able to care for her 3 grandchildren ages 43,3 and 77. No dedicated exercise.   Cyclobenzaprine 5mg  (usually takes 1-2 times a month) and lorazepam 1mg  every night at bedtime help with sleep.   She continues vitamin D supplements OTC. She thinks she takes 3000-5000iu daily.    HISTORY (copied from Dr previous note)  Veronica Frazier is a 55 y.o. woman with relapsing remitting MS and related symptoms.    Update 08/10/2020: She feels her MS is stable.   No exacerbations.  She is on Tecfidera.   No recent  flushing or stomach upset..  Last lymphocyte count was 0.9 5/12/20211      She is walking well for the most part but fell twice when she tripped on items.   She sometimes veers to the left and sometimes hits the door frames. Legs are strong.    She has mild hand numbness and tingling and usually notes more in colder weather.    Gabapentin helps.      She note mild fatigue, better than last visit.   Usually she does best in spring and fall.   Modafinil helps fatigue.    She notes mild cognitive issues with decreased memory and word finding issues at times.    She has had the Pfizer vaccination  And the booster last month      MS History:   She presented in 2000  with pain and tingling in the left arm. At that time, she had an MRI of the brain performed that was concerning for multiple sclerosis. Additionally, she had a lumbar puncture performed with CSF that was consistent with MS. Initially, she was placed on Avonex injections weekly. Initially, she did well with the Avonex but then had an MRI that showed some progression. About 2014 or 2015,  she switched to Tecfidera.    MRI of the brain 09/01/2017 showed multiple T2/FLAIR hyperintense foci in the periventricular, subcortical and  deep white matter of both hemispheres and in the pons. Although most of the foci are nonspecific, some are radially oriented to the ventricles and others are in the juxtacortical white matter, consistent with chronic demyelinating plaque associated with multiple sclerosis. None of the foci appears to be acute and they do not enhance.   REVIEW OF SYSTEMS: Out of a complete 14 system review of symptoms, the patient complains only of the following symptoms, arthritic pain, depression, anxiety, insomnia, falls, difficulty concentrating, fatigue and all other reviewed systems are negative.   ALLERGIES: No Known Allergies   HOME MEDICATIONS: Outpatient Medications Prior to Visit  Medication Sig Dispense Refill  .  amphetamine-dextroamphetamine (ADDERALL) 10 MG tablet TAKE 2 TO 3 TABLETS BY MOUTH DAILY 90 tablet 0  . ASPIRIN 81 PO Take 1 tablet by mouth daily.    Marland Kitchen. aspirin-acetaminophen-caffeine (EXCEDRIN MIGRAINE) 250-250-65 MG per tablet Take 1 tablet by mouth 3 (three) times daily as needed.    Marland Kitchen. atorvastatin (LIPITOR) 40 MG tablet TAKE 1 TABLET BY MOUTH ONCE DAILY AT NIGHT  3  . cyclobenzaprine (FLEXERIL) 5 MG tablet Take 1 tablet (5 mg total) by mouth at bedtime. (Patient taking differently: Take 5 mg by mouth as needed.) 30 tablet 5  . DULoxetine (CYMBALTA) 60 MG capsule Take 1 capsule by mouth daily 90 capsule 3  . FLUoxetine (PROZAC) 40 MG capsule Take 1 capsule by mouth daily 90 capsule 3  . FOLBEE 2.5-25-1 MG TABS tablet Take 1 tablet by mouth daily (Patient needs to call and schedule follow up for ongoing refills) 90 tablet 1  . gabapentin (NEURONTIN) 300 MG capsule TAKE 1 CAPSULE BY MOUTH 4 TIMES DAILY 360 capsule 3  . hydrochlorothiazide (HYDRODIURIL) 12.5 MG tablet Take 12.5 mg by mouth daily.    Marland Kitchen. LORazepam (ATIVAN) 1 MG tablet TAKE ONE TABLET BY MOUTH EVERY NIGHT AT BEDTIME 30 tablet 5  . LOSARTAN POTASSIUM PO Take 0.5 tablets by mouth daily.     . meloxicam (MOBIC) 15 MG tablet Take 1 tablet by mouth daily 90 tablet 3  . modafinil (PROVIGIL) 200 MG tablet Take 1/2 tablet by mouth 3 times a day 135 tablet 0  . Multiple Vitamins-Calcium (ONE-A-DAY WOMENS FORMULA PO) Take 1 tablet by mouth daily.    Marland Kitchen. omeprazole (PRILOSEC) 20 MG capsule     . TECFIDERA 240 MG CPDR Take one capsule twice daily at 10am and 5pm. 180 capsule 3  . VITAMIN D PO Take by mouth.    Marland Kitchen. amphetamine-dextroamphetamine (ADDERALL) 10 MG tablet Take 2 to 3 tablets daily 90 tablet 0  . modafinil (PROVIGIL) 200 MG tablet Take 1/2 tablet by mouth 3 times a day 75 tablet 0   No facility-administered medications prior to visit.     PAST MEDICAL HISTORY: Past Medical History:  Diagnosis Date  . Hypertension   . Multiple  sclerosis (HCC)   . Neuropathy   . Stroke (HCC)   . Vision abnormalities      PAST SURGICAL HISTORY: Past Surgical History:  Procedure Laterality Date  . ABDOMINAL HYSTERECTOMY    . APPENDECTOMY    . BREAST SURGERY    . CHOLECYSTECTOMY    . TONSILLECTOMY       FAMILY HISTORY: Family History  Problem Relation Age of Onset  . Hypertension Mother   . Hyperlipidemia Mother   . Colitis Mother   . Hypertension Father      SOCIAL HISTORY: Social History   Socioeconomic History  . Marital status:  Married    Spouse name: Not on file  . Number of children: Not on file  . Years of education: Not on file  . Highest education level: Not on file  Occupational History  . Not on file  Tobacco Use  . Smoking status: Current Every Day Smoker  . Smokeless tobacco: Never Used  Substance and Sexual Activity  . Alcohol use: No    Alcohol/week: 0.0 standard drinks  . Drug use: No  . Sexual activity: Not on file  Other Topics Concern  . Not on file  Social History Narrative   Right handed    Social Determinants of Health   Financial Resource Strain: Not on file  Food Insecurity: Not on file  Transportation Needs: Not on file  Physical Activity: Not on file  Stress: Not on file  Social Connections: Not on file  Intimate Partner Violence: Not on file      PHYSICAL EXAM  Vitals:   02/17/21 1324  BP: 119/79  Pulse: 87  Weight: 203 lb (92.1 kg)  Height: 5\' 7"  (1.702 m)   Body mass index is 31.79 kg/m.   Generalized: Well developed, in no acute distress  Cardiology: normal rate and rhythm, no murmur auscultated  Respiratory: clear to auscultation bilaterally    Neurological examination  Mentation: Alert oriented to time, place, history taking. Follows all commands speech and language fluent Cranial nerve II-XII: Pupils were equal round reactive to light. Extraocular movements were full, visual field were full on confrontational test. Facial sensation and  strength were normal. Head turning and shoulder shrug  were normal and symmetric. Motor: The motor testing reveals 5 over 5 strength of all 4 extremities. Good symmetric motor tone is noted throughout.  Sensory: Sensory testing is intact to soft touch on all 4 extremities. No evidence of extinction is noted.  Coordination: Cerebellar testing reveals good finger-nose-finger and heel-to-shin bilaterally.  Gait and station: Gait is normal, Tandem very mildly unsteady, no assistive device. No foot drop noted today.  Reflexes: Deep tendon reflexes are symmetric and normal bilaterally.     DIAGNOSTIC DATA (LABS, IMAGING, TESTING) - I reviewed patient records, labs, notes, testing and imaging myself where available.  Lab Results  Component Value Date   WBC 5.0 08/10/2020   HGB 12.7 08/10/2020   HCT 37.6 08/10/2020   MCV 94 08/10/2020   PLT 233 08/10/2020      Component Value Date/Time   NA 140 03/11/2020 1409   K 4.4 03/11/2020 1409   CL 101 03/11/2020 1409   CO2 24 03/11/2020 1409   GLUCOSE 110 (H) 03/11/2020 1409   GLUCOSE 146 (H) 12/30/2017 2327   BUN 14 03/11/2020 1409   CREATININE 0.81 03/11/2020 1409   CALCIUM 9.7 03/11/2020 1409   PROT 6.4 08/10/2020 1323   ALBUMIN 4.3 08/10/2020 1323   AST 16 08/10/2020 1323   ALT 20 08/10/2020 1323   ALKPHOS 93 08/10/2020 1323   BILITOT 0.7 08/10/2020 1323   GFRNONAA 82 03/11/2020 1409   GFRAA 95 03/11/2020 1409   No results found for: CHOL, HDL, LDLCALC, LDLDIRECT, TRIG, CHOLHDL No results found for: 05/11/2020 No results found for: VITAMINB12 No results found for: TSH  No flowsheet data found.   No flowsheet data found.   ASSESSMENT AND PLAN  55 y.o. year old female  has a past medical history of Hypertension, Multiple sclerosis (HCC), Neuropathy, Stroke (HCC), and Vision abnormalities. here with   Relapsing remitting multiple sclerosis (HCC) - Plan: CBC with  Differential/Platelets, Hepatic Function Panel  High risk medication  use - Plan: CBC with Differential/Platelets, Hepatic Function Panel  Gait disturbance  Chronic fatigue  Insomnia, unspecified type  Vitamin D deficiency - Plan: Vitamin D, 25-hydroxy  Multiple falls  Dysesthesia   Karalina feels that MS symptoms are stable. She continues to have multiple falls and chronic fatigue. Neuro exam intact. We have discussed safety precautions with falls. She was encouraged to use an assistive device to help prevent falls. Could consider formal gait assessment with PT. She does not feel this is needed. We have discussed ways to combat fatigue. Educational materials provided in AVS. She will continue Tecfidera. Continue Adderall and modafinil for fatigue and difficulty concentrating. She will continue cyclobenzaprine and lorazepam as needed for insomnia. PDMP reviewed and shows appropriate refills. She will continue gabapentin and meloxicam for pain and dysesthesias. Continue fluoxetine and duloxetine for mood management. Continue vitamin D supplements OTC. We will update labs. She will continue follow up with PCP as advised. Return to see Dr Epimenio Foot in 6 months. She verbalizes understanding and agreement with this plan.    Orders Placed This Encounter  Procedures  . CBC with Differential/Platelets  . Vitamin D, 25-hydroxy  . Hepatic Function Panel     No orders of the defined types were placed in this encounter.     I spent 30 minutes of face-to-face and non-face-to-face time with patient.  This included previsit chart review, lab review, study review, order entry, electronic health record documentation, patient education.    Shawnie Dapper, MSN, FNP-C 02/17/2021, 2:07 PM  [facility] [address] [place], [place] [address] [phone]

## 2021-02-18 LAB — CBC WITH DIFFERENTIAL/PLATELET
Basophils Absolute: 0 10*3/uL (ref 0.0–0.2)
Basos: 1 %
EOS (ABSOLUTE): 0.1 10*3/uL (ref 0.0–0.4)
Eos: 2 %
Hematocrit: 40.4 % (ref 34.0–46.6)
Hemoglobin: 13.6 g/dL (ref 11.1–15.9)
Immature Grans (Abs): 0 10*3/uL (ref 0.0–0.1)
Immature Granulocytes: 0 %
Lymphocytes Absolute: 1 10*3/uL (ref 0.7–3.1)
Lymphs: 19 %
MCH: 31.4 pg (ref 26.6–33.0)
MCHC: 33.7 g/dL (ref 31.5–35.7)
MCV: 93 fL (ref 79–97)
Monocytes Absolute: 0.5 10*3/uL (ref 0.1–0.9)
Monocytes: 8 %
Neutrophils Absolute: 3.8 10*3/uL (ref 1.4–7.0)
Neutrophils: 70 %
Platelets: 231 10*3/uL (ref 150–450)
RBC: 4.33 x10E6/uL (ref 3.77–5.28)
RDW: 13 % (ref 11.7–15.4)
WBC: 5.5 10*3/uL (ref 3.4–10.8)

## 2021-02-18 LAB — HEPATIC FUNCTION PANEL
ALT: 28 IU/L (ref 0–32)
AST: 19 IU/L (ref 0–40)
Albumin: 4.7 g/dL (ref 3.8–4.9)
Alkaline Phosphatase: 89 IU/L (ref 44–121)
Bilirubin Total: 0.5 mg/dL (ref 0.0–1.2)
Bilirubin, Direct: 0.12 mg/dL (ref 0.00–0.40)
Total Protein: 7.3 g/dL (ref 6.0–8.5)

## 2021-02-18 LAB — VITAMIN D 25 HYDROXY (VIT D DEFICIENCY, FRACTURES): Vit D, 25-Hydroxy: 38.9 ng/mL (ref 30.0–100.0)

## 2021-02-18 NOTE — Progress Notes (Signed)
I have read the note, and I agree with the clinical assessment and plan.  Yasiel Goyne A. Carlyn Lemke, MD, PhD, FAAN Certified in Neurology, Clinical Neurophysiology, Sleep Medicine, Pain Medicine and Neuroimaging  [facility] [address], [address] [place],  [address] [phone]  

## 2021-02-22 ENCOUNTER — Telehealth: Payer: Self-pay

## 2021-02-22 NOTE — Telephone Encounter (Signed)
Contacted pt regarding labs, LVM per DPR, informing her that labs looked good. Advised to call the office back with further questions.

## 2021-02-22 NOTE — Telephone Encounter (Signed)
-----   Message from Shawnie Dapper, NP sent at 02/18/2021  5:01 PM EDT ----- Labs look good!

## 2021-03-09 ENCOUNTER — Other Ambulatory Visit: Payer: Self-pay | Admitting: Neurology

## 2021-03-30 ENCOUNTER — Other Ambulatory Visit: Payer: Self-pay | Admitting: Neurology

## 2021-04-12 ENCOUNTER — Other Ambulatory Visit: Payer: Self-pay | Admitting: Neurology

## 2021-04-13 ENCOUNTER — Telehealth: Payer: Self-pay | Admitting: Family Medicine

## 2021-04-13 NOTE — Telephone Encounter (Signed)
Pt called needing a refill on her amphetamine-dextroamphetamine (ADDERALL) 10 MG tablet sent in to the [facility]

## 2021-04-13 NOTE — Telephone Encounter (Signed)
This was sent to Dr. Epimenio Foot via EJ/RN yesterday.

## 2021-05-11 ENCOUNTER — Other Ambulatory Visit: Payer: Self-pay | Admitting: Neurology

## 2021-06-09 ENCOUNTER — Other Ambulatory Visit: Payer: Self-pay | Admitting: Neurology

## 2021-06-28 ENCOUNTER — Telehealth: Payer: Self-pay | Admitting: Family Medicine

## 2021-06-28 NOTE — Telephone Encounter (Signed)
Pt would like a call about scheduling an infusion, please call.

## 2021-06-28 NOTE — Patient Instructions (Signed)
Below is our plan:  We will continue current treatment plan. I will update labs, today to look for any metabolic causes of worsening fatigue. I will also update MRI of the brain to make sure no breakthrough lesions are seen.   Please make sure you are staying well hydrated. I recommend 50-60 ounces daily. Well balanced diet and regular exercise encouraged. Consistent sleep schedule with 6-8 hours recommended.   Please continue follow up with care team as directed.   Follow up with Dr Epimenio Foot in 6 months   You may receive a survey regarding today's visit. I encourage you to leave honest feed back as I do use this information to improve patient care. Thank you for seeing me today!

## 2021-06-28 NOTE — Progress Notes (Addendum)
Chief Complaint  Patient presents with   Follow-up    New rm, alone. Pt reports being extremely fatigue since August. Pt reports being out of breath,  for example when taking a shower.      HISTORY OF PRESENT ILLNESS: 06/29/21 ALL:  Veronica Frazier returns, today, for an acute visit for worsening fatigue. She reports that fatigue significantly worsened 05/31/2021. Se noticed it while on a camping trip with her grand kids. She felt that it was due to the heat but has not improved. She denies recent illness. No known exposure to Covid. She has not tested. No unilateral weakness or sensory changes. She is usually able to swim at home but hasn't recently due to heat. She feels short of breath with activity. No chest pain. No changes in heart rate or palpitations. BP is good. She has appt with PCP next week.   She continues Tecfidera. Labs have been stable. Last MRI brain stable in 2018. MRI ordered 07/2020 but not performed.   She continues Adderall 10mg  TID and modafinil 200mg  1/2 tablet TID. She is also taking Folbee 2.5-25-1mg  and vitamin D supplements 3000-5000iu daily for B12 and vitamin D deficiency.   Mood is stable on fluoxetine 40 and duloxetine 60mg  QD. She is not sleeping well. She is waking up throughout the night. She feels this is due to napping during the day. She continues lorazepam 1mg  and cyclobenzaprine 5mg  QHS. She does snore. She has never had a sleep study.    Arthritic pain controlled with meloxicam 15mg  daily. She did have back pain last night but reports this isn't unusual for her.   She denies change in urinary function.   02/17/2021 ALL: Veronica Frazier is a 55 y.o. 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 Labs stable 07/2020. MRI 07/2020 was stable.   MS is fairly stable. No new symptoms. She continues to have difficulty with gait. No weakness of legs. She trips over things. She says that she can climb a roof and do just fine but will trip going down the  driveway. PT in the past. She does not use an assistive device. She feels assistive device will not help. She was fitted for foot drop brace but does not wear it. She has not noticed any concerns of foot drop. She feels that she just doesn't pay attention to what she is doing.   Gabapentin 300mg  QID helps with mild hand numbness/tingling. She denies feeling dizzy or lightheaded. She does not feel that medications contribute to falls. She takes meloxicam 15mg  daily for arthritic pain.   Mood is stable on fluoxetine 40mg  and duloxetine 60mg  daily. She is tolerating medications well. She is taking both modafinil 100mg  TID and Adderall 10mg  2-3 times daily for fatigue and concerns of fatigue and mild cognitive issues. She has difficulty with short term memory loss and word finding. She is able to care for her 3 grandchildren ages 262,3 and 26. No dedicated exercise.   Cyclobenzaprine 5mg  (usually takes 1-2 times a month) and lorazepam 1mg  every night at bedtime help with sleep.   She continues vitamin D supplements OTC. She thinks she takes 3000-5000iu daily.   HISTORY (copied from Dr Bonnita HollowSater's previous note)  Veronica Frazier is a 55 y.o. woman with relapsing remitting MS and related symptoms.     Update 08/10/2020: She feels her MS is stable.   No exacerbations.  She is on Tecfidera.   No recent flushing or stomach upset..  Last  lymphocyte count was 0.9 5/12/20211       She is walking well for the most part but fell twice when she tripped on items.   She sometimes veers to the left and sometimes hits the door frames. Legs are strong.    She has mild hand numbness and tingling and usually notes more in colder weather.    Gabapentin helps.       She note mild fatigue, better than last visit.   Usually she does best in spring and fall.   Modafinil helps fatigue.    She notes mild cognitive issues with decreased memory and word finding issues at times.     She has had the Pfizer vaccination  And the booster  last month      MS History:   She presented in 2000  with pain and tingling in the left arm. At that time, she had an MRI of the brain performed that was concerning for multiple sclerosis. Additionally, she had a lumbar puncture performed with CSF that was consistent with MS. Initially, she was placed on Avonex injections weekly. Initially, she did well with the Avonex but then had an MRI that showed some progression. About 2014 or 2015,  she switched to Tecfidera.     MRI of the brain 09/01/2017 showed multiple T2/FLAIR hyperintense foci in the periventricular, subcortical and deep white matter of both hemispheres and in the pons. Although most of the foci are nonspecific, some are radially oriented to the ventricles and others are in the juxtacortical white matter, consistent with chronic demyelinating plaque associated with multiple sclerosis. None of the foci appears to be acute and they do not enhance.   REVIEW OF SYSTEMS: Out of a complete 14 system review of symptoms, the patient complains only of the following symptoms, arthritic pain, depression, anxiety, insomnia, falls, difficulty concentrating, fatigue and all other reviewed systems are negative.   ALLERGIES: No Known Allergies   HOME MEDICATIONS: Outpatient Medications Prior to Visit  Medication Sig Dispense Refill   amphetamine-dextroamphetamine (ADDERALL) 10 MG tablet TAKE 2 TO 3 TABLETS BY MOUTH DAILY 90 tablet 0   ASPIRIN 81 PO Take 1 tablet by mouth daily.     aspirin-acetaminophen-caffeine (EXCEDRIN MIGRAINE) 250-250-65 MG per tablet Take 1 tablet by mouth 3 (three) times daily as needed.     atorvastatin (LIPITOR) 40 MG tablet TAKE 1 TABLET BY MOUTH ONCE DAILY AT NIGHT  3   cyclobenzaprine (FLEXERIL) 5 MG tablet Take 1 tablet (5 mg total) by mouth at bedtime. (Patient taking differently: Take 5 mg by mouth as needed. Per pt taking once a month) 30 tablet 5   DULoxetine (CYMBALTA) 60 MG capsule Take 1 capsule by mouth daily 90  capsule 3   FLUoxetine (PROZAC) 40 MG capsule Take 1 capsule by mouth daily 90 capsule 3   FOLBEE 2.5-25-1 MG TABS tablet Take 1 tablet by mouth daily (Patient needs to call and schedule follow up for ongoing refills) 90 tablet 0   gabapentin (NEURONTIN) 300 MG capsule TAKE 1 CAPSULE BY MOUTH 4 TIMES DAILY 360 capsule 3   hydrochlorothiazide (HYDRODIURIL) 12.5 MG tablet Take 12.5 mg by mouth daily.     LORazepam (ATIVAN) 1 MG tablet TAKE ONE TABLET BY MOUTH EVERY NIGHT AT BEDTIME 30 tablet 5   LOSARTAN POTASSIUM PO Take 0.5 tablets by mouth daily.      meloxicam (MOBIC) 15 MG tablet Take 1 tablet by mouth daily 90 tablet 3   modafinil (PROVIGIL) 200  MG tablet TAKE 1/2 TABLET BY MOUTH 3 TIMES A DAY 135 tablet 1   Multiple Vitamins-Calcium (ONE-A-DAY WOMENS FORMULA PO) Take 1 tablet by mouth daily.     omeprazole (PRILOSEC) 20 MG capsule      TECFIDERA 240 MG CPDR Take one capsule twice daily at 10am and 5pm. 180 capsule 3   VITAMIN D PO Take by mouth.     No facility-administered medications prior to visit.     PAST MEDICAL HISTORY: Past Medical History:  Diagnosis Date   Hypertension    Multiple sclerosis (HCC)    Neuropathy    Stroke (HCC)    Vision abnormalities      PAST SURGICAL HISTORY: Past Surgical History:  Procedure Laterality Date   ABDOMINAL HYSTERECTOMY     APPENDECTOMY     BREAST SURGERY     CHOLECYSTECTOMY     TONSILLECTOMY       FAMILY HISTORY: Family History  Problem Relation Age of Onset   Hypertension Mother    Hyperlipidemia Mother    Colitis Mother    Hypertension Father      SOCIAL HISTORY: Social History   Socioeconomic History   Marital status: Divorced    Spouse name: Not on file   Number of children: Not on file   Years of education: Not on file   Highest education level: Not on file  Occupational History   Not on file  Tobacco Use   Smoking status: Every Day   Smokeless tobacco: Never  Substance and Sexual Activity   Alcohol  use: No    Alcohol/week: 0.0 standard drinks   Drug use: No   Sexual activity: Not on file  Other Topics Concern   Not on file  Social History Narrative   Right handed    Social Determinants of Health   Financial Resource Strain: Not on file  Food Insecurity: Not on file  Transportation Needs: Not on file  Physical Activity: Not on file  Stress: Not on file  Social Connections: Not on file  Intimate Partner Violence: Not on file      PHYSICAL EXAM  Vitals:   06/29/21 1119  BP: 123/85  Pulse: 85  Weight: 208 lb 12.8 oz (94.7 kg)  Height: 5\' 6"  (1.676 m)    Body mass index is 33.7 kg/m.   Generalized: Well developed, in no acute distress  Cardiology: normal rate and rhythm, no murmur auscultated  Respiratory: clear to auscultation bilaterally    Neurological examination  Mentation: Alert oriented to time, place, history taking. Follows all commands speech and language fluent Cranial nerve II-XII: Pupils were equal round reactive to light. Extraocular movements were full, visual field were full on confrontational test. Facial sensation and strength were normal. Head turning and shoulder shrug  were normal and symmetric. Motor: The motor testing reveals 5 over 5 strength of all 4 extremities. Good symmetric motor tone is noted throughout.  Sensory: Sensory testing is intact to soft touch on all 4 extremities. No evidence of extinction is noted.  Coordination: Cerebellar testing reveals good finger-nose-finger and heel-to-shin bilaterally.  Gait and station: Gait is normal, Tandem very mildly unsteady, no assistive device. No foot drop noted today.  Reflexes: Deep tendon reflexes are symmetric and normal bilaterally.     DIAGNOSTIC DATA (LABS, IMAGING, TESTING) - I reviewed patient records, labs, notes, testing and imaging myself where available.  Lab Results  Component Value Date   WBC 5.5 02/17/2021   HGB 13.6 02/17/2021  HCT 40.4 02/17/2021   MCV 93  02/17/2021   PLT 231 02/17/2021      Component Value Date/Time   NA 140 03/11/2020 1409   K 4.4 03/11/2020 1409   CL 101 03/11/2020 1409   CO2 24 03/11/2020 1409   GLUCOSE 110 (H) 03/11/2020 1409   GLUCOSE 146 (H) 12/30/2017 2327   BUN 14 03/11/2020 1409   CREATININE 0.81 03/11/2020 1409   CALCIUM 9.7 03/11/2020 1409   PROT 7.3 02/17/2021 1410   ALBUMIN 4.7 02/17/2021 1410   AST 19 02/17/2021 1410   ALT 28 02/17/2021 1410   ALKPHOS 89 02/17/2021 1410   BILITOT 0.5 02/17/2021 1410   GFRNONAA 82 03/11/2020 1409   GFRAA 95 03/11/2020 1409   No results found for: CHOL, HDL, LDLCALC, LDLDIRECT, TRIG, CHOLHDL No results found for: DJME2A No results found for: VITAMINB12 No results found for: TSH  No flowsheet data found.   No flowsheet data found.   ASSESSMENT AND PLAN  55 y.o. year old female  has a past medical history of Hypertension, Multiple sclerosis (HCC), Neuropathy, Stroke (HCC), and Vision abnormalities. here with   Relapsing remitting multiple sclerosis (HCC) - Plan: MR BRAIN W WO CONTRAST, CBC with Differential/Platelets, CMP, Vitamin B12, Vitamin D, 25-hydroxy  High risk medication use - Plan: CBC with Differential/Platelets, CMP  Gait disturbance  Chronic fatigue - Plan: Vitamin B12, Vitamin D, 25-hydroxy, Thyroid Panel With TSH  Insomnia, unspecified type  Vitamin D deficiency - Plan: Vitamin D, 25-hydroxy  Dysesthesia  Low back pain, unspecified back pain laterality, unspecified chronicity, unspecified whether sciatica present - Plan: Urinalysis, Culture, Urine   Erilynn feels that MS symptoms are fairly stable with exception of worsening fatigue. Neuro exam intact. I will check labs today to rule out infectious/metabolic causes of fatigue. I will order MRI for monitoring. We have discussed ways to combat fatigue. Educational materials provided in AVS. She will continue Tecfidera. Continue Adderall and modafinil for fatigue and difficulty  concentrating. She will continue cyclobenzaprine and lorazepam as needed for insomnia. PDMP reviewed and shows appropriate refills. She will continue gabapentin and meloxicam for pain and dysesthesias. Continue fluoxetine and duloxetine for mood management. Continue vitamin D supplements OTC. We have discussed steroid infusion. She reports feeling terribly for days following previous infusions. May consider oral taper pending normal labs. She will continue follow up with PCP as advised. Return to see Dr Epimenio Foot in November as scheduled. She verbalizes understanding and agreement with this plan.    Orders Placed This Encounter  Procedures   Culture, Urine   MR BRAIN W WO CONTRAST    Standing Status:   Future    Standing Expiration Date:   06/28/2022    Order Specific Question:   If indicated for the ordered procedure, I authorize the administration of contrast media per Radiology protocol    Answer:   Yes    Order Specific Question:   What is the patient's sedation requirement?    Answer:   No Sedation    Order Specific Question:   Does the patient have a pacemaker or implanted devices?    Answer:   No    Order Specific Question:   Preferred imaging location?    Answer:   Internal   CBC with Differential/Platelets   CMP   Vitamin B12   Vitamin D, 25-hydroxy   Thyroid Panel With TSH   Urinalysis      No orders of the defined types were placed in this encounter.  Shawnie Dapper, MSN, FNP-C 06/29/2021, 12:04 PM  [facility] [address], [address] [place], [place] [address] [phone]   I have read the note, and I agree with the clinical assessment and plan.  Richard A. Epimenio Foot, MD, PhD, [facility] Certified in Neurology, Clinical Neurophysiology, Sleep Medicine, Pain Medicine and Neuroimaging  [facility] [address] [place], [place] [address] [phone]

## 2021-06-28 NOTE — Telephone Encounter (Signed)
The patient back to get more information.  Patient has been experiencing extreme fatigue that has been ongoing since August 1.  This has been continuous and causing disruption in her life.  Advised the patient our NP has an opening tomorrow to bring her in to be evaluated.  Patient was appreciative for the call back and accepted the appointment with Amy tomorrow 8/30 at 11:30 cm with check in 11 am

## 2021-06-29 ENCOUNTER — Encounter: Payer: Self-pay | Admitting: Family Medicine

## 2021-06-29 ENCOUNTER — Ambulatory Visit: Payer: PPO | Admitting: Family Medicine

## 2021-06-29 ENCOUNTER — Telehealth: Payer: Self-pay | Admitting: Family Medicine

## 2021-06-29 VITALS — BP 123/85 | HR 85 | Ht 66.0 in | Wt 208.8 lb

## 2021-06-29 DIAGNOSIS — R5382 Chronic fatigue, unspecified: Secondary | ICD-10-CM

## 2021-06-29 DIAGNOSIS — R208 Other disturbances of skin sensation: Secondary | ICD-10-CM

## 2021-06-29 DIAGNOSIS — G35 Multiple sclerosis: Secondary | ICD-10-CM

## 2021-06-29 DIAGNOSIS — G4700 Insomnia, unspecified: Secondary | ICD-10-CM

## 2021-06-29 DIAGNOSIS — Z79899 Other long term (current) drug therapy: Secondary | ICD-10-CM

## 2021-06-29 DIAGNOSIS — E559 Vitamin D deficiency, unspecified: Secondary | ICD-10-CM | POA: Diagnosis not present

## 2021-06-29 DIAGNOSIS — M545 Low back pain, unspecified: Secondary | ICD-10-CM

## 2021-06-29 DIAGNOSIS — R269 Unspecified abnormalities of gait and mobility: Secondary | ICD-10-CM

## 2021-06-29 NOTE — Telephone Encounter (Signed)
Health team order faxed to [facility] they will reach out to the patient to schedule. Ph # [phone] & fax # [phone].

## 2021-06-30 LAB — COMPREHENSIVE METABOLIC PANEL
ALT: 20 IU/L (ref 0–32)
AST: 17 IU/L (ref 0–40)
Albumin/Globulin Ratio: 2.2 (ref 1.2–2.2)
Albumin: 4.8 g/dL (ref 3.8–4.9)
Alkaline Phosphatase: 94 IU/L (ref 44–121)
BUN/Creatinine Ratio: 32 — ABNORMAL HIGH (ref 9–23)
BUN: 25 mg/dL — ABNORMAL HIGH (ref 6–24)
Bilirubin Total: 0.7 mg/dL (ref 0.0–1.2)
CO2: 25 mmol/L (ref 20–29)
Calcium: 9.7 mg/dL (ref 8.7–10.2)
Chloride: 98 mmol/L (ref 96–106)
Creatinine, Ser: 0.78 mg/dL (ref 0.57–1.00)
Globulin, Total: 2.2 g/dL (ref 1.5–4.5)
Glucose: 79 mg/dL (ref 65–99)
Potassium: 4.6 mmol/L (ref 3.5–5.2)
Sodium: 140 mmol/L (ref 134–144)
Total Protein: 7 g/dL (ref 6.0–8.5)
eGFR: 89 mL/min/{1.73_m2} (ref >59)

## 2021-06-30 LAB — URINALYSIS
Bilirubin, UA: NEGATIVE
Glucose, UA: NEGATIVE
Leukocytes,UA: NEGATIVE
Nitrite, UA: NEGATIVE
RBC, UA: NEGATIVE
Specific Gravity, UA: >=1.03 — AB (ref 1.005–1.030)
Urobilinogen, Ur: 1 mg/dL (ref 0.2–1.0)
pH, UA: 6 (ref 5.0–7.5)

## 2021-06-30 LAB — CBC WITH DIFFERENTIAL/PLATELET
Basophils Absolute: 0.1 10*3/uL (ref 0.0–0.2)
Basos: 1 %
EOS (ABSOLUTE): 0.1 10*3/uL (ref 0.0–0.4)
Eos: 2 %
Hematocrit: 40.7 % (ref 34.0–46.6)
Hemoglobin: 13.7 g/dL (ref 11.1–15.9)
Immature Grans (Abs): 0 10*3/uL (ref 0.0–0.1)
Immature Granulocytes: 1 %
Lymphocytes Absolute: 1.2 10*3/uL (ref 0.7–3.1)
Lymphs: 18 %
MCH: 31.1 pg (ref 26.6–33.0)
MCHC: 33.7 g/dL (ref 31.5–35.7)
MCV: 93 fL (ref 79–97)
Monocytes Absolute: 0.6 10*3/uL (ref 0.1–0.9)
Monocytes: 9 %
Neutrophils Absolute: 4.6 10*3/uL (ref 1.4–7.0)
Neutrophils: 69 %
Platelets: 257 10*3/uL (ref 150–450)
RBC: 4.4 x10E6/uL (ref 3.77–5.28)
RDW: 13.5 % (ref 11.7–15.4)
WBC: 6.6 10*3/uL (ref 3.4–10.8)

## 2021-06-30 LAB — THYROID PANEL WITH TSH
Free Thyroxine Index: 1.5 (ref 1.2–4.9)
T3 Uptake Ratio: 25 % (ref 24–39)
T4, Total: 6.1 ug/dL (ref 4.5–12.0)
TSH: 1.65 u[IU]/mL (ref 0.450–4.500)

## 2021-06-30 LAB — VITAMIN B12: Vitamin B-12: 990 pg/mL (ref 232–1245)

## 2021-06-30 LAB — VITAMIN D 25 HYDROXY (VIT D DEFICIENCY, FRACTURES): Vit D, 25-Hydroxy: 53.2 ng/mL (ref 30.0–100.0)

## 2021-07-02 DIAGNOSIS — Z87891 Personal history of nicotine dependence: Secondary | ICD-10-CM | POA: Diagnosis not present

## 2021-07-02 DIAGNOSIS — R5383 Other fatigue: Secondary | ICD-10-CM | POA: Diagnosis not present

## 2021-07-02 DIAGNOSIS — R7989 Other specified abnormal findings of blood chemistry: Secondary | ICD-10-CM | POA: Diagnosis not present

## 2021-07-02 DIAGNOSIS — R0600 Dyspnea, unspecified: Secondary | ICD-10-CM | POA: Diagnosis not present

## 2021-07-02 DIAGNOSIS — Z1159 Encounter for screening for other viral diseases: Secondary | ICD-10-CM | POA: Diagnosis not present

## 2021-07-02 DIAGNOSIS — I10 Essential (primary) hypertension: Secondary | ICD-10-CM | POA: Diagnosis not present

## 2021-07-02 DIAGNOSIS — G35 Multiple sclerosis: Secondary | ICD-10-CM | POA: Diagnosis not present

## 2021-07-02 DIAGNOSIS — I517 Cardiomegaly: Secondary | ICD-10-CM | POA: Diagnosis not present

## 2021-07-02 LAB — URINE CULTURE

## 2021-07-13 DIAGNOSIS — G35 Multiple sclerosis: Secondary | ICD-10-CM | POA: Diagnosis not present

## 2021-07-14 ENCOUNTER — Other Ambulatory Visit: Payer: Self-pay

## 2021-07-14 MED ORDER — AMPHETAMINE-DEXTROAMPHETAMINE 10 MG PO TABS: ORAL_TABLET | ORAL | 0 refills | Status: DC

## 2021-07-14 NOTE — Telephone Encounter (Signed)
Received refill request for Adderall. Last OV was on 06/29/21.  Next OV is scheduled for 09/01/21 .  Last RX was written on 06/09/21 for 90 tabs.   [place] Drug Database has been reviewed.

## 2021-07-15 DIAGNOSIS — I517 Cardiomegaly: Secondary | ICD-10-CM | POA: Diagnosis not present

## 2021-07-15 DIAGNOSIS — Z23 Encounter for immunization: Secondary | ICD-10-CM | POA: Diagnosis not present

## 2021-07-15 DIAGNOSIS — R5383 Other fatigue: Secondary | ICD-10-CM | POA: Diagnosis not present

## 2021-07-15 DIAGNOSIS — R0600 Dyspnea, unspecified: Secondary | ICD-10-CM | POA: Diagnosis not present

## 2021-07-20 ENCOUNTER — Telehealth: Payer: Self-pay | Admitting: Family Medicine

## 2021-07-20 MED ORDER — PREDNISONE 10 MG (21) PO TBPK: ORAL_TABLET | ORAL | 0 refills | Status: DC

## 2021-07-20 NOTE — Telephone Encounter (Signed)
Pt is asking for a call with results to her MRI as soon as they are available.

## 2021-07-20 NOTE — Telephone Encounter (Signed)
Called and spoke w/ pt about results. Pt verbalized understanding. She is still experiencing severe fatigue/muscle aches (states she cannot get up and move around as much d/t fatigue). Would like to try oral steroid taper that AL,NP offered previously. Would like this called into [facility] - [place], [place] - [address]. Advised I will send request to Amy L,NP.

## 2021-07-20 NOTE — Telephone Encounter (Signed)
EXAM:  MRI HEAD WITHOUT AND WITH CONTRAST   TECHNIQUE:  Multiplanar, multiecho pulse sequences of the brain and surrounding  structures were obtained without and with intravenous contrast.   COMPARISON:  MRI of the brain August 10, 2020.   FINDINGS:  Brain: No acute infarction, hemorrhage, hydrocephalus, extra-axial  collection or mass lesion. Scattered and confluent foci of T2  hyperintensity are seen within the white matter of the cerebral  hemispheres including deep, juxta cortical and periventricular white  matter, and within the pons, consistent with the clinical diagnosis  of multiple sclerosis. Tiny T2 hyperintense lesion within the left  cerebellar hemisphere may represent a remote lacunar infarct versus  demyelinating lesion, also unchanged. No new or enhancing lesion  identified.   Vascular: Normal flow voids.   Skull and upper cervical spine: Normal marrow signal.   Sinuses/Orbits: Negative.   Other: None.   IMPRESSION:  Findings consistent with the clinical diagnosis of multiple  sclerosis. No new or enhancing lesion identified.

## 2021-07-23 DIAGNOSIS — R0602 Shortness of breath: Secondary | ICD-10-CM | POA: Diagnosis not present

## 2021-08-05 DIAGNOSIS — J069 Acute upper respiratory infection, unspecified: Secondary | ICD-10-CM | POA: Diagnosis not present

## 2021-08-05 DIAGNOSIS — R0600 Dyspnea, unspecified: Secondary | ICD-10-CM | POA: Diagnosis not present

## 2021-08-05 DIAGNOSIS — R059 Cough, unspecified: Secondary | ICD-10-CM | POA: Diagnosis not present

## 2021-08-05 DIAGNOSIS — I119 Hypertensive heart disease without heart failure: Secondary | ICD-10-CM | POA: Diagnosis not present

## 2021-08-05 DIAGNOSIS — R0602 Shortness of breath: Secondary | ICD-10-CM | POA: Diagnosis not present

## 2021-08-11 ENCOUNTER — Other Ambulatory Visit: Payer: Self-pay | Admitting: Neurology

## 2021-08-16 ENCOUNTER — Other Ambulatory Visit: Payer: Self-pay | Admitting: Family Medicine

## 2021-08-16 MED ORDER — LORAZEPAM 1 MG PO TABS: 1.0000 mg | ORAL_TABLET | Freq: Every day | ORAL | 5 refills | Status: DC

## 2021-08-16 NOTE — Telephone Encounter (Signed)
Pt request refill for LORazepam (ATIVAN) 1 MG tablet at [facility]

## 2021-08-17 DIAGNOSIS — R0609 Other forms of dyspnea: Secondary | ICD-10-CM | POA: Diagnosis not present

## 2021-08-23 DIAGNOSIS — E669 Obesity, unspecified: Secondary | ICD-10-CM | POA: Diagnosis not present

## 2021-09-01 ENCOUNTER — Ambulatory Visit: Payer: PPO | Admitting: Neurology

## 2021-09-13 ENCOUNTER — Other Ambulatory Visit: Payer: Self-pay | Admitting: Neurology

## 2021-09-13 ENCOUNTER — Other Ambulatory Visit: Payer: Self-pay

## 2021-09-13 MED ORDER — AMPHETAMINE-DEXTROAMPHETAMINE 10 MG PO TABS: ORAL_TABLET | ORAL | 0 refills | Status: DC

## 2021-09-13 NOTE — Telephone Encounter (Signed)
Received refill request for modafinil.  Last OV was on 06/29/21.  Next OV has ont been scheduled.  Last RX was written on 06/09/21 for 135 tabs.    Drug Database has been reviewed.

## 2021-10-06 ENCOUNTER — Other Ambulatory Visit: Payer: Self-pay

## 2021-10-06 MED ORDER — TECFIDERA 240 MG PO CPDR: DELAYED_RELEASE_CAPSULE | ORAL | 0 refills | Status: DC

## 2021-10-18 ENCOUNTER — Other Ambulatory Visit: Payer: Self-pay

## 2021-10-18 NOTE — Progress Notes (Addendum)
Received refill request for Adderall 10mg .  Last OV was on 06/29/21.  Next OV is scheduled for 03/02/22. Last RX was written on 09/13/21 for 90 tabs.   [place] Drug Database has been reviewed.

## 2021-10-19 ENCOUNTER — Other Ambulatory Visit: Payer: Self-pay | Admitting: Family Medicine

## 2021-10-19 MED ORDER — AMPHETAMINE-DEXTROAMPHETAMINE 10 MG PO TABS: ORAL_TABLET | ORAL | 0 refills | Status: DC

## 2021-10-19 NOTE — Telephone Encounter (Signed)
Pt request refill for amphetamine-dextroamphetamine (ADDERALL) 10 MG tablet at [facility]

## 2021-11-18 ENCOUNTER — Other Ambulatory Visit: Payer: Self-pay | Admitting: Neurology

## 2021-11-23 ENCOUNTER — Other Ambulatory Visit: Payer: Self-pay | Admitting: Family Medicine

## 2021-11-23 MED ORDER — AMPHETAMINE-DEXTROAMPHETAMINE 10 MG PO TABS: ORAL_TABLET | ORAL | 0 refills | Status: DC

## 2021-11-23 NOTE — Telephone Encounter (Signed)
Pt request refill for amphetamine-dextroamphetamine (ADDERALL) 10 MG tablet at [facility]  °

## 2021-12-13 ENCOUNTER — Other Ambulatory Visit: Payer: Self-pay | Admitting: Neurology

## 2021-12-13 ENCOUNTER — Telehealth: Payer: Self-pay | Admitting: Family Medicine

## 2021-12-13 MED ORDER — TECFIDERA 240 MG PO CPDR: DELAYED_RELEASE_CAPSULE | ORAL | 3 refills | Status: DC

## 2021-12-13 NOTE — Telephone Encounter (Signed)
Pt is requesting a refill for TECFIDERA 240 MG CPDR.  Pharmacy:  [facility]

## 2021-12-13 NOTE — Telephone Encounter (Signed)
Refill has been sent for the pt. She has an  upcoming apt in may she must keep for future refills.

## 2021-12-14 NOTE — Telephone Encounter (Signed)
Received refill request for modafinil.  Last OV was on 06/29/21.  Next OV is scheduled for 03/02/22 .  Last RX was written on 09/13/21 for 135 tabs.   [place] Drug Database has been reviewed.

## 2021-12-21 ENCOUNTER — Other Ambulatory Visit: Payer: Self-pay | Admitting: Neurology

## 2021-12-22 ENCOUNTER — Other Ambulatory Visit: Payer: Self-pay | Admitting: Family Medicine

## 2021-12-22 MED ORDER — AMPHETAMINE-DEXTROAMPHETAMINE 10 MG PO TABS: ORAL_TABLET | ORAL | 0 refills | Status: DC

## 2021-12-22 MED ORDER — MODAFINIL 200 MG PO TABS: ORAL_TABLET | ORAL | 0 refills | Status: DC

## 2021-12-22 NOTE — Telephone Encounter (Addendum)
Pt is requesting a refill for amphetamine-dextroamphetamine (ADDERALL) 10 MG tablet . & 90 day for modafinil (PROVIGIL) 200 MG tablet  Pharmacy: [facility]

## 2021-12-22 NOTE — Telephone Encounter (Signed)
Amy- please e-scribe. Checked drug registry. Last refilled adderall 11/23/21 #90 and modafinil 12/14/21 #45 (30 days supply)

## 2021-12-23 ENCOUNTER — Other Ambulatory Visit: Payer: Self-pay | Admitting: Neurology

## 2021-12-23 MED ORDER — MODAFINIL 200 MG PO TABS: ORAL_TABLET | ORAL | 0 refills | Status: DC

## 2021-12-23 MED ORDER — AMPHETAMINE-DEXTROAMPHETAMINE 10 MG PO TABS: ORAL_TABLET | ORAL | 0 refills | Status: DC

## 2022-01-06 ENCOUNTER — Telehealth: Payer: Self-pay | Admitting: Family Medicine

## 2022-01-06 MED ORDER — TECFIDERA 240 MG PO CPDR: DELAYED_RELEASE_CAPSULE | ORAL | 3 refills | Status: DC

## 2022-01-06 NOTE — Telephone Encounter (Signed)
[facility] Brett Canales) request refill for TECFIDERA 240 MG CPDR at [facility] ?

## 2022-01-06 NOTE — Telephone Encounter (Signed)
Refill sent in today for the pt  ?

## 2022-01-10 ENCOUNTER — Telehealth: Payer: Self-pay | Admitting: Family Medicine

## 2022-01-10 DIAGNOSIS — G35 Multiple sclerosis: Secondary | ICD-10-CM

## 2022-01-10 MED ORDER — TECFIDERA 240 MG PO CPDR: DELAYED_RELEASE_CAPSULE | ORAL | 0 refills | Status: DC

## 2022-01-10 NOTE — Telephone Encounter (Signed)
E-scribed refill 

## 2022-01-10 NOTE — Telephone Encounter (Signed)
Veronica Frazier from [facility] called stating that the Pt is needing a refill request for her TECFIDERA 240 MG CPDR for a 90 day qt and she states that this is a STAT order. You can also fax to [phone] ?

## 2022-01-17 ENCOUNTER — Other Ambulatory Visit: Payer: Self-pay | Admitting: Family Medicine

## 2022-01-18 MED ORDER — AMPHETAMINE-DEXTROAMPHETAMINE 10 MG PO TABS: ORAL_TABLET | ORAL | 0 refills | Status: DC

## 2022-01-18 NOTE — Telephone Encounter (Signed)
Last OV was on 06/29/21.  ?Next OV is scheduled for 03/02/22 .  ?Last RX was written on 12/23/21 for 90 tabs.  ? ?[place] Drug Database has been reviewed.  ?

## 2022-02-17 ENCOUNTER — Other Ambulatory Visit: Payer: Self-pay | Admitting: Neurology

## 2022-02-17 MED ORDER — AMPHETAMINE-DEXTROAMPHETAMINE 10 MG PO TABS: ORAL_TABLET | ORAL | 0 refills | Status: DC

## 2022-02-20 ENCOUNTER — Other Ambulatory Visit: Payer: Self-pay

## 2022-02-20 ENCOUNTER — Emergency Department (HOSPITAL_COMMUNITY): Payer: PPO

## 2022-02-20 ENCOUNTER — Emergency Department (HOSPITAL_BASED_OUTPATIENT_CLINIC_OR_DEPARTMENT_OTHER): Payer: PPO

## 2022-02-20 ENCOUNTER — Encounter (HOSPITAL_BASED_OUTPATIENT_CLINIC_OR_DEPARTMENT_OTHER): Payer: Self-pay | Admitting: Emergency Medicine

## 2022-02-20 ENCOUNTER — Emergency Department (HOSPITAL_BASED_OUTPATIENT_CLINIC_OR_DEPARTMENT_OTHER)
Admission: EM | Admit: 2022-02-20 | Discharge: 2022-02-20 | Disposition: A | Discharge status: Home / Self Care | Payer: PPO | Attending: Emergency Medicine | Admitting: Emergency Medicine

## 2022-02-20 DIAGNOSIS — Z7982 Long term (current) use of aspirin: Secondary | ICD-10-CM | POA: Diagnosis not present

## 2022-02-20 DIAGNOSIS — R531 Weakness: Secondary | ICD-10-CM | POA: Diagnosis not present

## 2022-02-20 DIAGNOSIS — I10 Essential (primary) hypertension: Secondary | ICD-10-CM | POA: Insufficient documentation

## 2022-02-20 DIAGNOSIS — R519 Headache, unspecified: Secondary | ICD-10-CM | POA: Diagnosis not present

## 2022-02-20 DIAGNOSIS — Z20822 Contact with and (suspected) exposure to covid-19: Secondary | ICD-10-CM | POA: Insufficient documentation

## 2022-02-20 DIAGNOSIS — Z79899 Other long term (current) drug therapy: Secondary | ICD-10-CM | POA: Insufficient documentation

## 2022-02-20 DIAGNOSIS — J189 Pneumonia, unspecified organism: Secondary | ICD-10-CM

## 2022-02-20 DIAGNOSIS — R509 Fever, unspecified: Secondary | ICD-10-CM | POA: Diagnosis present

## 2022-02-20 LAB — URINALYSIS, ROUTINE W REFLEX MICROSCOPIC
Bilirubin Urine: NEGATIVE
Glucose, UA: NEGATIVE mg/dL
Hgb urine dipstick: NEGATIVE
Ketones, ur: NEGATIVE mg/dL
Leukocytes,Ua: NEGATIVE
Nitrite: NEGATIVE
Protein, ur: NEGATIVE mg/dL
Specific Gravity, Urine: 1.015 (ref 1.005–1.030)
pH: 6.5 (ref 5.0–8.0)

## 2022-02-20 LAB — BASIC METABOLIC PANEL
Anion gap: 11 (ref 5–15)
BUN: 23 mg/dL — ABNORMAL HIGH (ref 6–20)
CO2: 22 mmol/L (ref 22–32)
Calcium: 8.8 mg/dL — ABNORMAL LOW (ref 8.9–10.3)
Chloride: 100 mmol/L (ref 98–111)
Creatinine, Ser: 0.69 mg/dL (ref 0.44–1.00)
GFR, Estimated: >60 mL/min (ref >60)
Glucose, Bld: 123 mg/dL — ABNORMAL HIGH (ref 70–99)
Potassium: 4.1 mmol/L (ref 3.5–5.1)
Sodium: 133 mmol/L — ABNORMAL LOW (ref 135–145)

## 2022-02-20 LAB — CBC
HCT: 39.1 % (ref 36.0–46.0)
Hemoglobin: 13.3 g/dL (ref 12.0–15.0)
MCH: 31.8 pg (ref 26.0–34.0)
MCHC: 34 g/dL (ref 30.0–36.0)
MCV: 93.5 fL (ref 80.0–100.0)
Platelets: 195 10*3/uL (ref 150–400)
RBC: 4.18 MIL/uL (ref 3.87–5.11)
RDW: 12.6 % (ref 11.5–15.5)
WBC: 10 10*3/uL (ref 4.0–10.5)
nRBC: 0 % (ref 0.0–0.2)

## 2022-02-20 LAB — RESP PANEL BY RT-PCR (FLU A&B, COVID) ARPGX2
Influenza A by PCR: NEGATIVE
Influenza B by PCR: NEGATIVE
SARS Coronavirus 2 by RT PCR: NEGATIVE

## 2022-02-20 LAB — CBG MONITORING, ED: Glucose-Capillary: 142 mg/dL — ABNORMAL HIGH (ref 70–99)

## 2022-02-20 LAB — LACTIC ACID, PLASMA: Lactic Acid, Venous: 1.6 mmol/L (ref 0.5–1.9)

## 2022-02-20 MED ORDER — CEFTRIAXONE SODIUM 1 G IJ SOLR
1.0000 g | Freq: Once | INTRAMUSCULAR | Status: AC
  Administered 2022-02-20: 1 g via INTRAVENOUS
  Filled 2022-02-20: qty 10

## 2022-02-20 MED ORDER — METOCLOPRAMIDE HCL 5 MG/ML IJ SOLN
10.0000 mg | Freq: Once | INTRAMUSCULAR | Status: AC
  Administered 2022-02-20: 10 mg via INTRAVENOUS
  Filled 2022-02-20: qty 2

## 2022-02-20 MED ORDER — ACETAMINOPHEN 325 MG PO TABS
650.0000 mg | ORAL_TABLET | Freq: Once | ORAL | Status: AC
  Administered 2022-02-20: 650 mg via ORAL
  Filled 2022-02-20: qty 2

## 2022-02-20 MED ORDER — PROCHLORPERAZINE EDISYLATE 10 MG/2ML IJ SOLN
10.0000 mg | Freq: Once | INTRAMUSCULAR | Status: AC
  Administered 2022-02-20: 10 mg via INTRAVENOUS
  Filled 2022-02-20: qty 2

## 2022-02-20 MED ORDER — DIPHENHYDRAMINE HCL 50 MG/ML IJ SOLN
25.0000 mg | Freq: Once | INTRAMUSCULAR | Status: AC
  Administered 2022-02-20: 25 mg via INTRAVENOUS
  Filled 2022-02-20: qty 1

## 2022-02-20 MED ORDER — KETOROLAC TROMETHAMINE 15 MG/ML IJ SOLN
15.0000 mg | Freq: Once | INTRAMUSCULAR | Status: AC
  Administered 2022-02-20: 15 mg via INTRAVENOUS
  Filled 2022-02-20: qty 1

## 2022-02-20 MED ORDER — SODIUM CHLORIDE 0.9 % IV BOLUS
1000.0000 mL | Freq: Once | INTRAVENOUS | Status: AC
  Administered 2022-02-20: 1000 mL via INTRAVENOUS

## 2022-02-20 NOTE — ED Notes (Signed)
Pt arrives via [facility] for MRI. MRI made aware.  ?

## 2022-02-20 NOTE — ED Provider Notes (Signed)
4:54 PM ?Patient transferred to this facility from another, and on my initial discussion with the patient she has had a failed attempt at MRI, requests discharge, does not want to attempt symptom management with another MRI past.  She states that she has a neurologist with whom she is comfortable following up.  She is amenable to additional dose of medication for headache, but reiterates that she wants to go home after that, does not want to stay for additional evaluation.  Daughter and patient aware of return instructions as well. ?  ?Carmin Muskrat, MD ?02/20/22 1655 ? ?

## 2022-02-20 NOTE — ED Notes (Signed)
Stated she vomited in MRI and could not complete it. Stated she wanted to go home and call her neurologist tomorrow for follow up MRI. Stated the noise and lights with her being in a hallway bed was making her worse instead of better. MD informed. Patient in no distress. Daughter at bedside.  ?

## 2022-02-20 NOTE — ED Notes (Signed)
Report given to [facility]. [facility] at bedside 

## 2022-02-20 NOTE — ED Notes (Signed)
Pt transported to CT ?

## 2022-02-20 NOTE — ED Provider Notes (Signed)
?[facility] EMERGENCY DEPARTMENT ?Provider Note ? ? ?CSN: [id number] ?Arrival date & time: 02/20/22  1058 ? ?  ? ?History ? ?Chief Complaint  ?Patient presents with  ? Fatigue  ? Herpes Zoster  ? ? ?Veronica Frazier is a 56 y.o. female. ? ?Patient with history of MS, history of migraines, history of recent diagnosis of shingles approximately 10 days ago, presents with headache, fever, generalized fatigue.  Symptoms ongoing for the past 2 days.  Denies any vomiting denies any chest pain denies abdominal pain.  Denies any cough.  Denies neck pain. ? ? ?  ? ?Home Medications ?Prior to Admission medications   ?Medication Sig Start Date End Date Taking? Authorizing Provider  ?amphetamine-dextroamphetamine (ADDERALL) 10 MG tablet TAKE 2 TO 3 TABLETS BY MOUTH DAILY 02/17/22   Lomax, Amy, NP  ?ASPIRIN 81 PO Take 1 tablet by mouth daily.    [provider]  ?aspirin-acetaminophen-caffeine (EXCEDRIN MIGRAINE) [id number] MG per tablet Take 1 tablet by mouth 3 (three) times daily as needed.    [provider]  ?atorvastatin (LIPITOR) 40 MG tablet TAKE 1 TABLET BY MOUTH ONCE DAILY AT NIGHT 04/10/18   [provider]  ?cyclobenzaprine (FLEXERIL) 5 MG tablet Take 1 tablet (5 mg total) by mouth at bedtime. ?Patient taking differently: Take 5 mg by mouth as needed. Per pt taking once a month 06/06/18   Sater, Pearletha Furl, MD  ?DULoxetine (CYMBALTA) 60 MG capsule Take 1 capsule (60 mg total) by mouth daily. 12/22/21   Sater, Pearletha Furl, MD  ?FLUoxetine (PROZAC) 40 MG capsule Take 1 capsule (40 mg total) by mouth daily. 12/22/21   Sater, Pearletha Furl, MD  ?Maudie Flakes 2.5-25-1 MG TABS tablet Take 1 tablet by mouth daily (Patient needs to call and schedule follow up for ongoing refills) 03/31/21   Sater, Pearletha Furl, MD  ?gabapentin (NEURONTIN) 300 MG capsule Take 1 capsule by mouth 4 times a day 12/22/21   Sater, Pearletha Furl, MD  ?hydrochlorothiazide (HYDRODIURIL) 12.5 MG tablet Take 12.5 mg by mouth daily. 12/04/19    [provider]  ?LORazepam (ATIVAN) 1 MG tablet TAKE ONE TABLET BY MOUTH EVERY NIGHT AT BEDTIME 02/17/22   Lomax, Amy, NP  ?LOSARTAN POTASSIUM PO Take 0.5 tablets by mouth daily.     [provider]  ?meloxicam (MOBIC) 15 MG tablet Take 1 tablet (15 mg total) by mouth daily. 12/22/21   Sater, Pearletha Furl, MD  ?modafinil (PROVIGIL) 200 MG tablet Take 1/2 tablet by mouth three times daily 01/09/22   Shawnie Dapper, NP  ?Multiple Vitamins-Calcium (ONE-A-DAY WOMENS FORMULA PO) Take 1 tablet by mouth daily.    [provider]  ?omeprazole (PRILOSEC) 20 MG capsule  03/02/15   [provider]  ?predniSONE (STERAPRED UNI-PAK 21 TAB) 10 MG (21) TBPK tablet Taper pack as directed 07/20/21   Shawnie Dapper, NP  ?TECFIDERA 240 MG CPDR Take one capsule twice daily at 10am and 5pm. Pt must keep upcoming apt in May for future refills. 01/10/22   Lomax, Amy, NP  ?VITAMIN D PO Take by mouth.    [provider]  ?   ? ?Allergies    ?Patient has no known allergies.   ? ?Review of Systems   ?Review of Systems  ?Constitutional:  Positive for fever.  ?HENT:  Negative for ear pain.   ?Eyes:  Negative for pain.  ?Respiratory:  Negative for cough.   ?Cardiovascular:  Negative for chest pain.  ?Gastrointestinal:  Negative for abdominal pain.  ?  Genitourinary:  Negative for flank pain.  ?Musculoskeletal:  Negative for back pain.  ?Skin:  Negative for rash.  ?Neurological:  Positive for headaches.  ? ?Physical Exam ?Updated Vital Signs ?BP 114/67   Pulse 85   Temp 98.5 ?F (36.9 ?C) (Oral)   Resp 17   Ht 5\' 6"  (1.676 m)   Wt 82.6 kg   SpO2 93%   BMI 29.38 kg/m?  ?Physical Exam ?Constitutional:   ?   General: She is not in acute distress. ?   Appearance: Normal appearance.  ?HENT:  ?   Head: Normocephalic.  ?   Nose: Nose normal.  ?Eyes:  ?   Extraocular Movements: Extraocular movements intact.  ?   Conjunctiva/sclera: Conjunctivae normal.  ?   Pupils: Pupils are equal, round, and reactive to light.   ?Cardiovascular:  ?   Rate and Rhythm: Normal rate.  ?Pulmonary:  ?   Effort: Pulmonary effort is normal.  ?Musculoskeletal:     ?   General: Normal range of motion.  ?   Cervical back: Normal range of motion and neck supple. No rigidity.  ?Lymphadenopathy:  ?   Cervical: No cervical adenopathy.  ?Neurological:  ?   General: No focal deficit present.  ?   Mental Status: She is alert and oriented to person, place, and time. Mental status is at baseline.  ?   Cranial Nerves: No cranial nerve deficit.  ?   Motor: No weakness.  ? ? ?ED Results / Procedures / Treatments   ?Labs ?(all labs ordered are listed, but only abnormal results are displayed) ?Labs Reviewed  ?BASIC METABOLIC PANEL - Abnormal; Notable for the following components:  ?    Result Value  ? Sodium 133 (*)   ? Glucose, Bld 123 (*)   ? BUN 23 (*)   ? Calcium 8.8 (*)   ? All other components within normal limits  ?CBG MONITORING, ED - Abnormal; Notable for the following components:  ? Glucose-Capillary 142 (*)   ? All other components within normal limits  ?RESP PANEL BY RT-PCR (FLU A&B, COVID) [id number]  ?CULTURE, BLOOD (ROUTINE X 2)  ?CULTURE, BLOOD (ROUTINE X 2)  ?CBC  ?LACTIC ACID, PLASMA  ?URINALYSIS, ROUTINE W REFLEX MICROSCOPIC  ?LACTIC ACID, PLASMA  ? ? ?EKG ?EKG Interpretation ? ?Date/Time:  Sunday February 20 2022 11:32:10 EDT ?Ventricular Rate:  118 ?PR Interval:  140 ?QRS Duration: 80 ?QT Interval:  314 ?QTC Calculation: 440 ?R Axis:   32 ?Text Interpretation: Sinus tachycardia Otherwise normal ECG No previous ECGs available Confirmed by Norman Clay ([id number]) on 02/20/2022 11:58:36 AM ? ?Radiology ?CT Head Wo Contrast ? ?Result Date: 02/20/2022 ?CLINICAL DATA:  Provided history: Headache, new or worsening. Additional history provided: Slow gait, headache, dizziness, fatigue. Diagnosis of shingles 1 week prior to arrival. EXAM: CT HEAD WITHOUT CONTRAST TECHNIQUE: Contiguous axial images were obtained from the base of the skull through the vertex without  intravenous contrast. RADIATION DOSE REDUCTION: This exam was performed according to the departmental dose-optimization program which includes automated exposure control, adjustment of the mA and/or kV according to patient size and/or use of iterative reconstruction technique. COMPARISON:  Brain MRI 07/13/2021.  Head CT 06/11/2011. FINDINGS: Brain: No age advanced or lobar predominant parenchymal atrophy. Age advanced patchy and ill-defined hypoattenuation within the cerebral white matter, nonspecific but compatible with the patient's history of multiple sclerosis. Apparent subcentimeter focus of hypodensity within the right pons (series 2, image 9) (series 4, image 33). There is no acute  intracranial hemorrhage. No demarcated cortical infarct. No extra-axial fluid collection. No evidence of an intracranial mass. No midline shift. Vascular: No hyperdense vessel.  Atherosclerotic calcifications. Skull: Normal. Negative for fracture or focal lesion. Sinuses/Orbits: Visualized orbits show no acute finding. Mild mucosal thickening within the right sphenoid sinus. IMPRESSION: Apparent subcentimeter focus of abnormal hypodensity within the right pons, which may reflect an age-indeterminate lacunar infarct, a demyelinating lesion or artifact. Consider a brain MRI for further evaluation. Age-advanced cerebral white matter disease consistent with the patient's history of multiple sclerosis. Mild mucosal thickening within the right sphenoid sinus. Electronically Signed   By: Jackey Loge D.O.   On: 02/20/2022 12:36  ? ?DG Chest Portable 1 View ? ?Result Date: 02/20/2022 ?CLINICAL DATA:  Generalized weakness. Headache. Nausea and diarrhea. EXAM: PORTABLE CHEST 1 VIEW COMPARISON:  Chest two views 08/05/2021 FINDINGS: Cardiac silhouette is again mildly enlarged. Mediastinal contours are within normal limits with mild calcification within the aortic arch. New mild right basilar heterogeneous airspace opacification. The left lung  appears clear. No pleural effusion or pneumothorax. No acute skeletal abnormality. IMPRESSION: New right basilar heterogeneous airspace opacity suspicious for pneumonia. Recommend follow-up radiographs 6 weeks after treatment

## 2022-02-20 NOTE — ED Triage Notes (Signed)
Pt arrives pov, slow gait, c/o HA, dizziness and fatigue today at 0800. Last normal 10pm last night. Reports dx of shingles x 1 week pta. Decreased po intake. Bilaterally equal ?

## 2022-02-20 NOTE — Discharge Instructions (Signed)
As discussed, with your headache, it is very important that you follow-up with your neurologist.  Return here for concerning changes in your condition. ?

## 2022-02-22 ENCOUNTER — Telehealth: Payer: Self-pay | Admitting: Family Medicine

## 2022-02-22 NOTE — Telephone Encounter (Signed)
Pt's daughter [place] called needing to discuss an MRI and CT scan that the pt was to have in the ER ?

## 2022-02-22 NOTE — Telephone Encounter (Signed)
I placed 4/27 at 2:30 with amy on hold to offer to the pt for sooner apt. She is also scheduled 5/3 and may keep that upcoming apt and we can discuss ordering the imaging at that time,  ?

## 2022-02-22 NOTE — Telephone Encounter (Signed)
Attempted to call the daughter back and the number says "call can not be completed at this time..." I tried 4 different times and this was same response I would get. ?Called the pt and it went straight to VM. LVM advising that we had gotten call from the daughter. Amy had a opening this week and I was going to see about working her in sooner. Asked for pt to call back.  ?

## 2022-02-22 NOTE — Telephone Encounter (Signed)
Daughter called back informed office we had wrong phone number.  ?Pt took the appt for 4/27 at 2:30.  ?Correct phone number for daughter, [place] [phone] ?

## 2022-02-23 NOTE — Progress Notes (Addendum)
? ? ?Chief Complaint  ?Patient presents with  ? Follow-up  ?  Rm 8, alone. Here to f/u and discuss MRI and CT scan. Pt has been dizzy and lightheaded, BP has been high. Has been having HA.   ? ? ?HISTORY OF PRESENT ILLNESS: ? ?02/24/22 ALL:  ?Veronica Frazier returns for acute visit to discuss recent MRI/CT results. She was last seen by me in 05/2021 for acute visit due to worsening fatigue. MRI brain 07/2021 was stable, no new lesions. Steroid taper started for continued fatigue. Tecfidera continued. She had scheduled follow up with Dr Felecia Shelling in 08/2021 that was cancelled.  ? ?She returns, today, after calling yesterday for acute visit. She reports that 4 days ago she was having trouble with a headaches, felt light headed/dizzy, was nauseated and blood pressure was elevated. Went to [facility]. CT showed abnormal focus in right pons, possible lacunar infarct, demyelinating lesion or artifact. MRI attempted but she was unable due to vomiting. She did not wish to treat symptoms and repeat. Chest xray concerning for pna. She was treated with IV rocephin. She was given a migraine cocktail, headache resolved and she was sent home. She reports Monday and Tuesday she did not feel well. She reports BP has been up and down. She has two readings with her today from this morning, 169/96 and 157/89. She reports a period Monday night where she noted head twitching for 4 hours. She felt that she was aware of every breath, was very cold, shivering, had blurred vision. She mentions feeling like she was having a panic attack. She reports lying down and falling asleep. She woke up feeling back to baseline. Of note, she tells me that she was diagnosed with shingles about 10 days ago and started on valacyclovir. No known fevers, no double vision or vision loss, no new weakness/numbness. She does not smoke.  ? ?She continues Adderall 20-30mg  daily, modafinil 100-200mg  daily and Folbee/vitamin D for fatigue and cognitive deficits. On gabapentin  300mg  QID and duloxetine 60mg  daily for dysesthesias and mood. Flexeril 5mg  takien rarely for insomnia and muscle cramps. Taking Ativan 1mg  at bedtime for insomnia. Fluoxetine 40mg  for mood.  ? ? ?06/29/2021 ALL:  ?Veronica Frazier returns, today, for an acute visit for worsening fatigue. She reports that fatigue significantly worsened 05/31/2021. Se noticed it while on a camping trip with her grand kids. She felt that it was due to the heat but has not improved. She denies recent illness. No known exposure to Covid. She has not tested. No unilateral weakness or sensory changes. She is usually able to swim at home but hasn't recently due to heat. She feels short of breath with activity. No chest pain. No changes in heart rate or palpitations. BP is good. She has appt with PCP next week.  ? ?She continues Tecfidera. Labs have been stable. Last MRI brain stable in 2018. MRI ordered 07/2020 but not performed.  ? ?She continues Adderall 10mg  TID and modafinil 200mg  1/2 tablet TID. She is also taking Folbee 2.5-25-1mg  and vitamin D supplements 3000-5000iu daily for B12 and vitamin D deficiency.  ? ?Mood is stable on fluoxetine 40 and duloxetine 60mg  QD. She is not sleeping well. She is waking up throughout the night. She feels this is due to napping during the day. She continues lorazepam 1mg  and cyclobenzaprine 5mg  QHS. She does snore. She has never had a sleep study.   ? ?Arthritic pain controlled with meloxicam 15mg  daily. She did have back pain last night but  reports this isn't unusual for her.  ? ?She denies change in urinary function.  ? ?02/17/2021 ALL: ?Veronica Frazier is a 56 y.o. 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 Labs stable 07/2020. MRI 07/2020 was stable.  ? ?MS is fairly stable. No new symptoms. She continues to have difficulty with gait. No weakness of legs. She trips over things. She says that she can climb a roof and do just fine but will trip going down the driveway. PT in the past. She does not  use an assistive device. She feels assistive device will not help. She was fitted for foot drop brace but does not wear it. She has not noticed any concerns of foot drop. She feels that she just doesn't pay attention to what she is doing.  ? ?Gabapentin 300mg  QID helps with mild hand numbness/tingling. She denies feeling dizzy or lightheaded. She does not feel that medications contribute to falls. She takes meloxicam 15mg  daily for arthritic pain.  ? ?Mood is stable on fluoxetine 40mg  and duloxetine 60mg  daily. She is tolerating medications well. She is taking both modafinil 100mg  TID and Adderall 10mg  2-3 times daily for fatigue and concerns of fatigue and mild cognitive issues. She has difficulty with short term memory loss and word finding. She is able to care for her 3 grandchildren ages 13,3 and 30. No dedicated exercise.  ? ?Cyclobenzaprine 5mg  (usually takes 1-2 times a month) and lorazepam 1mg  every night at bedtime help with sleep.  ? ?She continues vitamin D supplements OTC. She thinks she takes 3000-5000iu daily.  ? ?HISTORY (copied from Dr Garth Bigness previous note) ? ?Veronica Frazier is a 56 y.o. woman with relapsing remitting MS and related symptoms.   ?  ?Update 08/10/2020: ?She feels her MS is stable.   No exacerbations.  She is on Tecfidera.   No recent flushing or stomach upset..  Last lymphocyte count was 0.9 5/12/20211     ?  ?She is walking well for the most part but fell twice when she tripped on items.   She sometimes veers to the left and sometimes hits the door frames. Legs are strong.    She has mild hand numbness and tingling and usually notes more in colder weather.    Gabapentin helps.     ?  ?She note mild fatigue, better than last visit.   Usually she does best in spring and fall.   Modafinil helps fatigue.    She notes mild cognitive issues with decreased memory and word finding issues at times.   ?  ?She has had the [place] vaccination  And the booster last month    ?  ?MS History:   She  presented in 2000  with pain and tingling in the left arm. At that time, she had an MRI of the brain performed that was concerning for multiple sclerosis. Additionally, she had a lumbar puncture performed with CSF that was consistent with MS. Initially, she was placed on Avonex injections weekly. Initially, she did well with the Avonex but then had an MRI that showed some progression. About 2014 or 2015,  she switched to Tecfidera.   ?  ?MRI of the brain 09/01/2017 showed multiple T2/FLAIR hyperintense foci in the periventricular, subcortical and deep white matter of both hemispheres and in the pons. Although most of the foci are nonspecific, some are radially oriented to the ventricles and others are in the juxtacortical white matter, consistent with chronic demyelinating plaque associated with  multiple sclerosis. None of the foci appears to be acute and they do not enhance. ? ? ?REVIEW OF SYSTEMS: Out of a complete 14 system review of symptoms, the patient complains only of the following symptoms, arthritic pain, depression, anxiety, insomnia, falls, difficulty concentrating, fatigue and all other reviewed systems are negative. ? ? ?ALLERGIES: ?No Known Allergies ? ? ?HOME MEDICATIONS: ?Outpatient Medications Prior to Visit  ?Medication Sig Dispense Refill  ? amphetamine-dextroamphetamine (ADDERALL) 10 MG tablet TAKE 2 TO 3 TABLETS BY MOUTH DAILY 90 tablet 0  ? ASPIRIN 81 PO Take 1 tablet by mouth daily.    ? aspirin-acetaminophen-caffeine (EXCEDRIN MIGRAINE) 250-250-65 MG per tablet Take 1 tablet by mouth 3 (three) times daily as needed.    ? atorvastatin (LIPITOR) 40 MG tablet TAKE 1 TABLET BY MOUTH ONCE DAILY AT NIGHT  3  ? cyclobenzaprine (FLEXERIL) 5 MG tablet Take 1 tablet (5 mg total) by mouth at bedtime. (Patient taking differently: Take 5 mg by mouth as needed. Per pt taking once a month) 30 tablet 5  ? DULoxetine (CYMBALTA) 60 MG capsule Take 1 capsule (60 mg total) by mouth daily. 90 capsule 1  ?  FLUoxetine (PROZAC) 40 MG capsule Take 1 capsule (40 mg total) by mouth daily. 90 capsule 1  ? gabapentin (NEURONTIN) 300 MG capsule Take 1 capsule by mouth 4 times a day 360 capsule 1  ? hydrochlorothiazide (HYD

## 2022-02-24 ENCOUNTER — Ambulatory Visit: Payer: PPO | Admitting: Family Medicine

## 2022-02-24 ENCOUNTER — Encounter: Payer: Self-pay | Admitting: Family Medicine

## 2022-02-24 VITALS — BP 108/73 | HR 76 | Ht 66.0 in | Wt 183.0 lb

## 2022-02-24 DIAGNOSIS — G35 Multiple sclerosis: Secondary | ICD-10-CM | POA: Diagnosis not present

## 2022-02-24 DIAGNOSIS — Z79899 Other long term (current) drug therapy: Secondary | ICD-10-CM | POA: Diagnosis not present

## 2022-02-24 DIAGNOSIS — B029 Zoster without complications: Secondary | ICD-10-CM | POA: Diagnosis not present

## 2022-02-24 DIAGNOSIS — R030 Elevated blood-pressure reading, without diagnosis of hypertension: Secondary | ICD-10-CM | POA: Diagnosis not present

## 2022-02-24 DIAGNOSIS — R9089 Other abnormal findings on diagnostic imaging of central nervous system: Secondary | ICD-10-CM

## 2022-02-24 NOTE — Patient Instructions (Signed)
Below is our plan: ? ?We will continue current treatment plan. I will order an MRI to assess for any changes. Please listen for a call to schedule. If your symptoms worse, please seek emergency medical attention immediately. Please call PCP asap for follow up on blood pressure, shingles, and dizziness.  ? ?Please make sure you are staying well hydrated. I recommend 50-60 ounces daily. Well balanced diet and regular exercise encouraged. Consistent sleep schedule with 6-8 hours recommended.  ? ?Please continue follow up with care team as directed.  ? ?Follow up with Dr Epimenio Foot at first available appt. ? ?You may receive a survey regarding today's visit. I encourage you to leave honest feed back as I do use this information to improve patient care. Thank you for seeing me today!  ? ? ?

## 2022-02-25 LAB — CULTURE, BLOOD (ROUTINE X 2)
Culture: NO GROWTH
Culture: NO GROWTH
Special Requests: ADEQUATE
Special Requests: ADEQUATE

## 2022-02-28 ENCOUNTER — Telehealth: Payer: Self-pay | Admitting: Family Medicine

## 2022-02-28 NOTE — Telephone Encounter (Signed)
HTA order sent to [facility], [facility] they will reach out to the patient.  ?

## 2022-03-02 ENCOUNTER — Ambulatory Visit: Payer: PPO | Admitting: Family Medicine

## 2022-03-06 ENCOUNTER — Other Ambulatory Visit: Payer: PPO

## 2022-03-09 ENCOUNTER — Ambulatory Visit
Admission: RE | Admit: 2022-03-09 | Discharge: 2022-03-09 | Disposition: A | Discharge status: Home / Self Care | Payer: PPO | Source: Ambulatory Visit | Attending: Family Medicine | Admitting: Family Medicine

## 2022-03-09 DIAGNOSIS — Z79899 Other long term (current) drug therapy: Secondary | ICD-10-CM

## 2022-03-09 DIAGNOSIS — G35 Multiple sclerosis: Secondary | ICD-10-CM | POA: Diagnosis not present

## 2022-03-09 DIAGNOSIS — R9089 Other abnormal findings on diagnostic imaging of central nervous system: Secondary | ICD-10-CM

## 2022-03-09 MED ORDER — GADOBENATE DIMEGLUMINE 529 MG/ML IV SOLN
17.0000 mL | Freq: Once | INTRAVENOUS | Status: AC | PRN
  Administered 2022-03-09: 17 mL via INTRAVENOUS

## 2022-03-16 ENCOUNTER — Ambulatory Visit: Payer: PPO | Admitting: Neurology

## 2022-03-16 ENCOUNTER — Encounter: Payer: Self-pay | Admitting: Neurology

## 2022-03-16 VITALS — BP 114/72 | HR 81 | Ht 66.0 in | Wt 181.0 lb

## 2022-03-16 DIAGNOSIS — R208 Other disturbances of skin sensation: Secondary | ICD-10-CM

## 2022-03-16 DIAGNOSIS — B029 Zoster without complications: Secondary | ICD-10-CM | POA: Diagnosis not present

## 2022-03-16 DIAGNOSIS — G35 Multiple sclerosis: Secondary | ICD-10-CM | POA: Diagnosis not present

## 2022-03-16 DIAGNOSIS — Z79899 Other long term (current) drug therapy: Secondary | ICD-10-CM | POA: Diagnosis not present

## 2022-03-16 DIAGNOSIS — R5382 Chronic fatigue, unspecified: Secondary | ICD-10-CM

## 2022-03-16 NOTE — Progress Notes (Signed)
? ?[facility] ? ?PATIENT: Veronica Frazier ?DOB: 1966-04-14 ? ?REFERRING CLINICIAN: Harlene Ramus ?HISTORY FROM: patient ?REASON FOR VISIT: MS ? ? ?HISTORICAL ? ?CHIEF COMPLAINT:  ?Chief Complaint  ?Patient presents with  ? Follow-up  ?  Rm 1, alone. Here to f/u for MS, on Tecfidera and tolerating well. MS stable. Pt has had shingles for 6 weeks.   ? ? ?HISTORY OF PRESENT ILLNESS:  ?Veronica Frazier is a 56 y.o. woman with relapsing remitting MS and related symptoms.   ? ?Update 08/10/2020: ?She feels her MS is stable.   No exacerbations.  She is on Tecfidera.   No recent flushing or stomach upset..  Last lymphocyte count was 0.9 5/12/20211       MRI brain 5/102023 showed moderate white matter foci, no change and no acute lesions.      ? ?Last month her BP spiked and she had a bad HA.  She went to the ED and had a CT.  She threw up while trying to get an MRI.  She was told she had pneumonia  (Right basilar opacity on CXR).    She also has had shingles in the right flank and under breast that started around the same time.   She was given an anti-viral and gabapentin was increased (now 900 mg tid).     Lidocaine patches have not helped as much.    The rash is improving.   ; ? ?She is walking well for the most part but fell twice when she tripped on items.   She sometimes veers to the left and sometimes hits the door frames. Legs are strong.    She has mild hand numbness and tingling and usually notes more in colder weather.    Gabapentin helps.     ? ?She note mild fatigue, better than last visit.   Usually she does best in spring and fall.   Modafinil helps fatigue.    She notes mild cognitive issues with decreased memory and word finding issues at times.   ? ?She has had the Pfizer vaccination  And the booster last month    ? ? ?MS History:   She presented in 2000  with pain and tingling in the left arm. At that time, she had an MRI of the brain performed that was concerning for multiple  sclerosis. Additionally, she had a lumbar puncture performed with CSF that was consistent with MS. Initially, she was placed on Avonex injections weekly. Initially, she did well with the Avonex but then had an MRI that showed some progression. About 2014 or 2015,  she switched to Tecfidera.   ? ?MRI of the brain 09/01/2017 showed multiple T2/FLAIR hyperintense foci in the periventricular, subcortical and deep white matter of both hemispheres and in the pons. Although most of the foci are nonspecific, some are radially oriented to the ventricles and others are in the juxtacortical white matter, consistent with chronic demyelinating plaque associated with multiple sclerosis. None of the foci appears to be acute and they do not enhance. ? ? ?REVIEW OF SYSTEMS:  ?Constitutional: No fevers, chills, sweats, or change in appetite   She has fatigue that has worsened ?Eyes: Blurry vision and rare diplopia.  No eye pain ?Ear, nose and throat: No hearing loss, ear pain, nasal congestion, sore throat ?Cardiovascular: No chest pain, palpitations ?Respiratory:  No shortness of breath at rest or with exertion.   No wheezes   She snores ?GastrointestinaI: No nausea but recent diarrhea  and constipation.    Colitis being treated ?Genitourinary:  No dysuria, urinary retention or frequency.  No nocturia. ?Musculoskeletal:  No neck pain.  Mild axial back pain.   Right arm pain ?Integumentary: No rash, pruritus, skin lesions ?Neurological: as above ?Psychiatric:  She has some depression ?Endocrine: No palpitations, diaphoresis.     ?Hematologic/Lymphatic:  No anemia, purpura, petechiae. ?Allergic/Immunologic: No itchy/runny eyes, nasal congestion, recent allergic reactions, rashes ? ?ALLERGIES: ?No Known Allergies ? ?HOME MEDICATIONS: ?Outpatient Medications Prior to Visit  ?Medication Sig Dispense Refill  ? amphetamine-dextroamphetamine (ADDERALL) 10 MG tablet TAKE 2 TO 3 TABLETS BY MOUTH DAILY 90 tablet 0  ? ASPIRIN 81 PO Take 1 tablet  by mouth daily.    ? aspirin-acetaminophen-caffeine (EXCEDRIN MIGRAINE) 250-250-65 MG per tablet Take 1 tablet by mouth 3 (three) times daily as needed.    ? atorvastatin (LIPITOR) 40 MG tablet TAKE 1 TABLET BY MOUTH ONCE DAILY AT NIGHT  3  ? cyclobenzaprine (FLEXERIL) 5 MG tablet Take 1 tablet (5 mg total) by mouth at bedtime. (Patient taking differently: Take 5 mg by mouth as needed. Per pt taking once a month) 30 tablet 5  ? DULoxetine (CYMBALTA) 60 MG capsule Take 1 capsule (60 mg total) by mouth daily. 90 capsule 1  ? FLUoxetine (PROZAC) 40 MG capsule Take 1 capsule (40 mg total) by mouth daily. 90 capsule 1  ? gabapentin (NEURONTIN) 300 MG capsule Take 1 capsule by mouth 4 times a day 360 capsule 1  ? hydrochlorothiazide (HYDRODIURIL) 12.5 MG tablet Take 12.5 mg by mouth daily.    ? LORazepam (ATIVAN) 1 MG tablet TAKE ONE TABLET BY MOUTH EVERY NIGHT AT BEDTIME 30 tablet 5  ? LOSARTAN POTASSIUM PO Take 0.5 tablets by mouth daily.     ? meloxicam (MOBIC) 15 MG tablet Take 1 tablet (15 mg total) by mouth daily. 90 tablet 1  ? modafinil (PROVIGIL) 200 MG tablet Take 1/2 tablet by mouth three times daily 135 tablet 0  ? Multiple Vitamins-Calcium (ONE-A-DAY WOMENS FORMULA PO) Take 1 tablet by mouth daily.    ? omeprazole (PRILOSEC) 20 MG capsule     ? TECFIDERA 240 MG CPDR Take one capsule twice daily at 10am and 5pm. Pt must keep upcoming apt in May for future refills. 180 capsule 0  ? VITAMIN D PO Take by mouth.    ? ?No facility-administered medications prior to visit.  ? ? ?PAST MEDICAL HISTORY: ?Past Medical History:  ?Diagnosis Date  ? Hypertension   ? Multiple sclerosis (HCC)   ? Neuropathy   ? Stroke [facility])   ? Vision abnormalities   ? ? ?PAST SURGICAL HISTORY: ?Past Surgical History:  ?Procedure Laterality Date  ? ABDOMINAL HYSTERECTOMY    ? APPENDECTOMY    ? BREAST SURGERY    ? CHOLECYSTECTOMY    ? TONSILLECTOMY    ? ? ?FAMILY HISTORY: ?Family History  ?Problem Relation Age of Onset  ? Hypertension Mother    ? Hyperlipidemia Mother   ? Colitis Mother   ? Hypertension Father   ? ? ?SOCIAL HISTORY: ? ?Social History  ? ?Socioeconomic History  ? Marital status: Divorced  ?  Spouse name: Not on file  ? Number of children: Not on file  ? Years of education: Not on file  ? Highest education level: Not on file  ?Occupational History  ? Not on file  ?Tobacco Use  ? Smoking status: Former  ?  Types: Cigarettes  ? Smokeless tobacco: Never  ?Vaping  Use  ? Vaping Use: Every day  ?Substance and Sexual Activity  ? Alcohol use: No  ?  Alcohol/week: 0.0 standard drinks  ? Drug use: No  ? Sexual activity: Not on file  ?Other Topics Concern  ? Not on file  ?Social History Narrative  ? Right handed   ? ?Social Determinants of Health  ? ?Financial Resource Strain: Not on file  ?Food Insecurity: Not on file  ?Transportation Needs: Not on file  ?Physical Activity: Not on file  ?Stress: Not on file  ?Social Connections: Not on file  ?Intimate Partner Violence: Not on file  ? ? ? ?PHYSICAL EXAM ? ?Vitals:  ? 03/16/22 1420  ?BP: 114/72  ?Pulse: 81  ?Weight: 181 lb (82.1 kg)  ?Height: 5\' 6"  (1.676 m)  ? ? ?Body mass index is 29.21 kg/m?. ? ? ?General: The patient is well-developed and well-nourished and in no acute distress.  She has a shingles rash at the T5 and/or T6 dermatome. ?    ?Neurologic Exam ? ?Mental status: The patient is alert and oriented x 3 at the time of the examination. The patient has apparent normal recent and remote memory, with an apparently normal attention span and concentration ability.   Speech is normal. ? ?Cranial nerves: Extraocular movements are full.   Facial strength and sensation was normal.  Trapezius strength was normal. ? ?Motor:  Muscle bulk and tone are normal.  Strength is 5/5 ? ?Sensory: Intact sensation to touch and vibration in the arms and legs. ? ?Coordination: Cerebellar testing shows normal finger-nose-finger.. ? ?Gait and station: Station is normal.  Gait is slightly wide.  Tandem gait is  moderately wide.   Romberg is negative ? ?Reflexes: Deep tendon reflexes are symmetric and normal bilaterally.  ? ? ? ?DIAGNOSTIC DATA (LABS, IMAGING, TESTING) ?- I reviewed patient records, labs, notes, testing an

## 2022-03-17 ENCOUNTER — Other Ambulatory Visit (INDEPENDENT_AMBULATORY_CARE_PROVIDER_SITE_OTHER): Payer: Self-pay

## 2022-03-17 DIAGNOSIS — Z0289 Encounter for other administrative examinations: Secondary | ICD-10-CM

## 2022-03-18 LAB — CBC WITH DIFFERENTIAL/PLATELET
Basophils Absolute: 0 10*3/uL (ref 0.0–0.2)
Basos: 0 %
EOS (ABSOLUTE): 0 10*3/uL (ref 0.0–0.4)
Eos: 0 %
Hematocrit: 37.2 % (ref 34.0–46.6)
Hemoglobin: 12.5 g/dL (ref 11.1–15.9)
Immature Grans (Abs): 0.1 10*3/uL (ref 0.0–0.1)
Immature Granulocytes: 1 %
Lymphocytes Absolute: 1.5 10*3/uL (ref 0.7–3.1)
Lymphs: 15 %
MCH: 31.6 pg (ref 26.6–33.0)
MCHC: 33.6 g/dL (ref 31.5–35.7)
MCV: 94 fL (ref 79–97)
Monocytes Absolute: 0.7 10*3/uL (ref 0.1–0.9)
Monocytes: 7 %
Neutrophils Absolute: 7.7 10*3/uL — ABNORMAL HIGH (ref 1.4–7.0)
Neutrophils: 77 %
Platelets: 273 10*3/uL (ref 150–450)
RBC: 3.96 x10E6/uL (ref 3.77–5.28)
RDW: 12.2 % (ref 11.7–15.4)
WBC: 10 10*3/uL (ref 3.4–10.8)

## 2022-03-30 ENCOUNTER — Other Ambulatory Visit: Payer: Self-pay | Admitting: Neurology

## 2022-03-30 ENCOUNTER — Telehealth: Payer: Self-pay | Admitting: Neurology

## 2022-03-30 DIAGNOSIS — G35 Multiple sclerosis: Secondary | ICD-10-CM

## 2022-03-30 MED ORDER — TECFIDERA 240 MG PO CPDR: DELAYED_RELEASE_CAPSULE | ORAL | 3 refills | Status: DC

## 2022-03-30 NOTE — Telephone Encounter (Signed)
LVM at 12:30 pm [facility] request refill for TECFIDERA 240 MG CPDR at [facility])

## 2022-04-04 ENCOUNTER — Other Ambulatory Visit: Payer: Self-pay

## 2022-04-04 MED ORDER — AMPHETAMINE-DEXTROAMPHETAMINE 10 MG PO TABS: ORAL_TABLET | ORAL | 0 refills | Status: DC

## 2022-04-04 NOTE — Progress Notes (Signed)
Last OV was on 03/16/22.  Next OV is pending to be scheduled.  Last RX was written on 02/17/22 for 90 tabs.   [place] Drug Database has been reviewed.

## 2022-04-18 ENCOUNTER — Other Ambulatory Visit: Payer: Self-pay | Admitting: Neurology

## 2022-04-18 MED ORDER — MODAFINIL 200 MG PO TABS: ORAL_TABLET | ORAL | 1 refills | Status: DC

## 2022-05-02 ENCOUNTER — Other Ambulatory Visit: Payer: Self-pay

## 2022-05-02 NOTE — Progress Notes (Signed)
Rx refill request sent via fax.  Verify Drug Registry For Dextroamp-Amphetamin 10 Mg Tab Last Filled: 04/04/2022 Quantity: 90 tablets for 30 days Last appointment: 03/16/2022 Next appointment: N/A

## 2022-05-09 ENCOUNTER — Encounter: Payer: Self-pay | Admitting: Neurology

## 2022-05-09 MED ORDER — AMPHETAMINE-DEXTROAMPHETAMINE 10 MG PO TABS: ORAL_TABLET | ORAL | 0 refills | Status: DC

## 2022-06-07 ENCOUNTER — Other Ambulatory Visit: Payer: Self-pay

## 2022-06-07 MED ORDER — AMPHETAMINE-DEXTROAMPHETAMINE 10 MG PO TABS: ORAL_TABLET | ORAL | 0 refills | Status: DC

## 2022-06-07 NOTE — Progress Notes (Signed)
Last OV was on 03/16/22.  Next OV is pending to be scheduled.  Last RX was written on 05/09/22 for 90 tabs.   [place] Drug Database has been reviewed.

## 2022-06-08 ENCOUNTER — Other Ambulatory Visit: Payer: Self-pay | Admitting: *Deleted

## 2022-06-08 MED ORDER — AMPHETAMINE-DEXTROAMPHETAMINE 10 MG PO TABS: ORAL_TABLET | ORAL | 0 refills | Status: DC

## 2022-06-09 ENCOUNTER — Encounter: Payer: Self-pay | Admitting: Neurology

## 2022-06-09 ENCOUNTER — Other Ambulatory Visit: Payer: Self-pay | Admitting: Neurology

## 2022-06-09 MED ORDER — AMPHETAMINE-DEXTROAMPHETAMINE 10 MG PO TABS: ORAL_TABLET | ORAL | 0 refills | Status: DC

## 2022-06-09 NOTE — Telephone Encounter (Signed)
Duplicate. Do not fill

## 2022-06-13 ENCOUNTER — Other Ambulatory Visit: Payer: Self-pay | Admitting: Neurology

## 2022-06-13 MED ORDER — AMPHETAMINE-DEXTROAMPHETAMINE 10 MG PO TABS: ORAL_TABLET | ORAL | 0 refills | Status: DC

## 2022-06-24 ENCOUNTER — Other Ambulatory Visit: Payer: Self-pay | Admitting: Neurology

## 2022-06-27 ENCOUNTER — Other Ambulatory Visit: Payer: Self-pay | Admitting: Neurology

## 2022-06-27 ENCOUNTER — Encounter: Payer: Self-pay | Admitting: Neurology

## 2022-06-27 MED ORDER — FLUOXETINE HCL 40 MG PO CAPS: 40.0000 mg | ORAL_CAPSULE | Freq: Every day | ORAL | 1 refills | Status: DC

## 2022-06-27 MED ORDER — MELOXICAM 15 MG PO TABS: 15.0000 mg | ORAL_TABLET | Freq: Every day | ORAL | 1 refills | Status: DC

## 2022-06-27 MED ORDER — GABAPENTIN 300 MG PO CAPS: ORAL_CAPSULE | ORAL | 1 refills | Status: DC

## 2022-06-27 MED ORDER — DULOXETINE HCL 60 MG PO CPEP: 60.0000 mg | ORAL_CAPSULE | Freq: Every day | ORAL | 1 refills | Status: DC

## 2022-07-11 ENCOUNTER — Other Ambulatory Visit: Payer: Self-pay | Admitting: Neurology

## 2022-08-07 ENCOUNTER — Other Ambulatory Visit: Payer: Self-pay | Admitting: Neurology

## 2022-08-08 MED ORDER — AMPHETAMINE-DEXTROAMPHETAMINE 10 MG PO TABS: ORAL_TABLET | ORAL | 0 refills | Status: DC

## 2022-08-08 NOTE — Telephone Encounter (Signed)
Pt in has an up coming appt and has been checked in the registry.

## 2022-08-22 ENCOUNTER — Other Ambulatory Visit: Payer: Self-pay

## 2022-08-22 MED ORDER — LORAZEPAM 1 MG PO TABS: 1.0000 mg | ORAL_TABLET | Freq: Every day | ORAL | 1 refills | Status: DC

## 2022-08-23 MED ORDER — LORAZEPAM 1 MG PO TABS
1.0000 mg | ORAL_TABLET | Freq: Every day | ORAL | 1 refills | Status: DC
Start: 2022-08-23 — End: 2022-09-19

## 2022-09-16 ENCOUNTER — Other Ambulatory Visit: Payer: Self-pay | Admitting: Neurology

## 2022-09-19 ENCOUNTER — Ambulatory Visit: Payer: PPO | Admitting: Neurology

## 2022-09-19 ENCOUNTER — Encounter: Payer: Self-pay | Admitting: Neurology

## 2022-09-19 VITALS — BP 118/82 | HR 85 | Ht 64.0 in | Wt 159.0 lb

## 2022-09-19 DIAGNOSIS — R208 Other disturbances of skin sensation: Secondary | ICD-10-CM

## 2022-09-19 DIAGNOSIS — R5382 Chronic fatigue, unspecified: Secondary | ICD-10-CM | POA: Diagnosis not present

## 2022-09-19 DIAGNOSIS — Z79899 Other long term (current) drug therapy: Secondary | ICD-10-CM

## 2022-09-19 DIAGNOSIS — G4700 Insomnia, unspecified: Secondary | ICD-10-CM

## 2022-09-19 DIAGNOSIS — G35 Multiple sclerosis: Secondary | ICD-10-CM | POA: Diagnosis not present

## 2022-09-19 DIAGNOSIS — R269 Unspecified abnormalities of gait and mobility: Secondary | ICD-10-CM

## 2022-09-19 MED ORDER — AMPHETAMINE-DEXTROAMPHETAMINE 10 MG PO TABS: ORAL_TABLET | ORAL | 0 refills | Status: DC

## 2022-09-19 MED ORDER — CYCLOBENZAPRINE HCL 5 MG PO TABS: 5.0000 mg | ORAL_TABLET | Freq: Every day | ORAL | 3 refills | Status: AC

## 2022-09-19 MED ORDER — LORAZEPAM 1 MG PO TABS: 1.0000 mg | ORAL_TABLET | Freq: Every day | ORAL | 3 refills | Status: DC

## 2022-09-19 NOTE — Progress Notes (Signed)
[facility]  PATIENT: Veronica Frazier DOB: 09-09-1966  REFERRING CLINICIAN: Harlene Ramus HISTORY FROM: patient REASON FOR VISIT: MS   HISTORICAL  CHIEF COMPLAINT:  Chief Complaint  Patient presents with   Follow-up    Pt in room #2  and alone. Pt here today for f/u with tecfidera for MS.    HISTORY OF PRESENT ILLNESS:  Veronica Frazier is a 56 y.o. woman with relapsing remitting MS and related symptoms.    Update 09/19/2022: She had shingles March 2023 and Covid-19 n July and pneumonia 2 weeks ago.   She is on Tecfidera and her last lymphocyte count was normal at 1.5.   She has done her done Covid vaccinations 5 times, last one last month.  She re-did the pneumonia vaccine last month.  She will be oing RSV later this winter.      She feels her MS is stable.   No exacerbations.  She is on Tecfidera.   No recent flushing or stomach upset.Marland Kitchen            MRI brain 5/102023 showed moderate white matter foci, no change and no acute lesions.   Some foci are periventricular while others are more in the deep white matter.  MRI likely combination of demyelination and chronic microvascular ischemic change.    She has vascular risks:  h/o tobacco and HTN (better after weight loss with Ozempic -- 60 pounds).    Last month her BP spiked and she had a bad HA.  She went to the ED and had a CT.  She threw up while trying to get an MRI.  She was told she had pneumonia  (Right basilar opacity on CXR).    She also has had shingles in the right flank and under breast that started around the same time.   She was given an anti-viral and gabapentin was increased (now 900 mg tid).     Lidocaine patches have not helped as much.    The rash is improving.   ;  She is walking well .   No recent falls   She sometimes veers to the left and sometimes hits the door frames. Legs are strong.    She has mild hand numbness and tingling and usually notes more in colder weather.    Gabapentin  helps.      She note mild fatigue, better than last visit.   Usually she does best in spring and fall.   Modafinil and Adderall helps fatigue.    She notes mild cognitive issues with decreased memory and word finding issues at times.       MS History:   She presented in 2000  with pain and tingling in the left arm. At that time, she had an MRI of the brain performed that was concerning for multiple sclerosis. Additionally, she had a lumbar puncture performed with CSF that was consistent with MS. Initially, she was placed on Avonex injections weekly. Initially, she did well with the Avonex but then had an MRI that showed some progression. About 2014 or 2015,  she switched to Tecfidera.    MRI of the brain 09/01/2017 showed multiple T2/FLAIR hyperintense foci in the periventricular, subcortical and deep white matter of both hemispheres and in the pons. Although most of the foci are nonspecific, some are radially oriented to the ventricles and others are in the juxtacortical white matter, consistent with chronic demyelinating plaque associated with multiple sclerosis. None of the foci appears to  be acute and they do not enhance.   REVIEW OF SYSTEMS:  Constitutional: No fevers, chills, sweats, or change in appetite   She has fatigue that has worsened Eyes: Blurry vision and rare diplopia.  No eye pain Ear, nose and throat: No hearing loss, ear pain, nasal congestion, sore throat Cardiovascular: No chest pain, palpitations Respiratory:  No shortness of breath at rest or with exertion.   No wheezes   She snores GastrointestinaI: No nausea but recent diarrhea and constipation.    Colitis being treated Genitourinary:  No dysuria, urinary retention or frequency.  No nocturia. Musculoskeletal:  No neck pain.  Mild axial back pain.   Right arm pain Integumentary: No rash, pruritus, skin lesions Neurological: as above Psychiatric:  She has some depression Endocrine: No palpitations, diaphoresis.      Hematologic/Lymphatic:  No anemia, purpura, petechiae. Allergic/Immunologic: No itchy/runny eyes, nasal congestion, recent allergic reactions, rashes  ALLERGIES: No Known Allergies  HOME MEDICATIONS: Outpatient Medications Prior to Visit  Medication Sig Dispense Refill   amphetamine-dextroamphetamine (ADDERALL) 10 MG tablet TAKE 2 TO 3 TABLETS BY MOUTH ONCE DAILY 90 tablet 0   ASPIRIN 81 PO Take 1 tablet by mouth daily.     aspirin-acetaminophen-caffeine (EXCEDRIN MIGRAINE) 250-250-65 MG per tablet Take 1 tablet by mouth 3 (three) times daily as needed.     atorvastatin (LIPITOR) 40 MG tablet TAKE 1 TABLET BY MOUTH ONCE DAILY AT NIGHT  3   cyclobenzaprine (FLEXERIL) 5 MG tablet Take 1 tablet (5 mg total) by mouth at bedtime. (Patient taking differently: Take 5 mg by mouth as needed. Per pt taking once a month) 30 tablet 5   DULoxetine (CYMBALTA) 60 MG capsule Take 1 capsule (60 mg total) by mouth daily. 90 capsule 1   FLUoxetine (PROZAC) 40 MG capsule Take 1 capsule (40 mg total) by mouth daily. 90 capsule 1   gabapentin (NEURONTIN) 300 MG capsule Take 1 capsule by mouth 4 times a day 360 capsule 1   hydrochlorothiazide (HYDRODIURIL) 12.5 MG tablet Take 12.5 mg by mouth daily.     LORazepam (ATIVAN) 1 MG tablet Take 1 tablet (1 mg total) by mouth at bedtime. 30 tablet 1   LOSARTAN POTASSIUM PO Take 0.5 tablets by mouth daily.      meloxicam (MOBIC) 15 MG tablet Take 1 tablet (15 mg total) by mouth daily. 90 tablet 1   modafinil (PROVIGIL) 200 MG tablet Take 1/2 tablet by mouth three times daily 135 tablet 1   Multiple Vitamins-Calcium (ONE-A-DAY WOMENS FORMULA PO) Take 1 tablet by mouth daily.     omeprazole (PRILOSEC) 20 MG capsule      TECFIDERA 240 MG CPDR Take one capsule twice daily at 10am and 5pm. Pt must keep upcoming apt in May for future refills. 180 capsule 3   VITAMIN D PO Take by mouth.     No facility-administered medications prior to visit.    PAST MEDICAL  HISTORY: Past Medical History:  Diagnosis Date   Hypertension    Multiple sclerosis ([place])    Neuropathy    Stroke ([place])    Vision abnormalities     PAST SURGICAL HISTORY: Past Surgical History:  Procedure Laterality Date   ABDOMINAL HYSTERECTOMY     APPENDECTOMY     BREAST SURGERY     CHOLECYSTECTOMY     TONSILLECTOMY      FAMILY HISTORY: Family History  Problem Relation Age of Onset   Hypertension Mother    Hyperlipidemia Mother  Colitis Mother    Hypertension Father     SOCIAL HISTORY:  Social History   Socioeconomic History   Marital status: Divorced    Spouse name: Not on file   Number of children: Not on file   Years of education: Not on file   Highest education level: Not on file  Occupational History   Not on file  Tobacco Use   Smoking status: Former    Types: Cigarettes   Smokeless tobacco: Never  Vaping Use   Vaping Use: Every day  Substance and Sexual Activity   Alcohol use: No    Alcohol/week: 0.0 standard drinks of alcohol   Drug use: No   Sexual activity: Not on file  Other Topics Concern   Not on file  Social History Narrative   Right handed    Social Determinants of Health   Financial Resource Strain: Not on file  Food Insecurity: Not on file  Transportation Needs: Not on file  Physical Activity: Not on file  Stress: Not on file  Social Connections: Not on file  Intimate Partner Violence: Not on file     PHYSICAL EXAM  Vitals:   09/19/22 1400  BP: 118/82  Pulse: 85  Weight: 159 lb (72.1 kg)  Height: 5\' 4"  (1.626 m)    Body mass index is 27.29 kg/m.   General: The patient is well-developed and well-nourished and in no acute distress.  She has a shingles rash at the T5 and/or T6 dermatome.     Neurologic Exam  Mental status: The patient is alert and oriented x 3 at the time of the examination. The patient has apparent normal recent and remote memory, with an apparently normal attention span and concentration  ability.   Speech is normal.  Cranial nerves: Extraocular movements are full.   Facial strength and sensation was normal.  Trapezius strength was normal.  Motor:  Muscle bulk and tone are normal.  Strength is 5/5  Sensory: Intact sensation to touch and vibration in the arms and legs.  Coordination: Cerebellar testing shows normal finger-nose-finger..  Gait and station: Station is normal.  Gait is slightly wide.  Tandem gait is moderately wide.   Romberg is negative  Reflexes: Deep tendon reflexes are symmetric and normal bilaterally.     DIAGNOSTIC DATA (LABS, IMAGING, TESTING) - I reviewed patient records, labs, notes, testing and imaging myself where available.  Lab Results  Component Value Date   WBC 10.0 03/17/2022   HGB 12.5 03/17/2022   HCT 37.2 03/17/2022   MCV 94 03/17/2022   PLT 273 03/17/2022      Component Value Date/Time   NA 133 (L) 02/20/2022 1203   NA 140 06/29/2021 1151   K 4.1 02/20/2022 1203   CL 100 02/20/2022 1203   CO2 22 02/20/2022 1203   GLUCOSE 123 (H) 02/20/2022 1203   BUN 23 (H) 02/20/2022 1203   BUN 25 (H) 06/29/2021 1151   CREATININE 0.69 02/20/2022 1203   CALCIUM 8.8 (L) 02/20/2022 1203   PROT 7.0 06/29/2021 1151   ALBUMIN 4.8 06/29/2021 1151   AST 17 06/29/2021 1151   ALT 20 06/29/2021 1151   ALKPHOS 94 06/29/2021 1151   BILITOT 0.7 06/29/2021 1151   GFRNONAA >60 02/20/2022 1203   GFRAA 95 03/11/2020 1409       ASSESSMENT AND PLAN    1. Relapsing remitting multiple sclerosis ([place])   2. High risk medication use   3. Chronic fatigue   4. Dysesthesia  5. Gait disturbance   6. Insomnia, unspecified type       1.   Continue Tecfidera. Check CBC with diff she is having much more fatigue likely associated with her recent pneumonia and shingles rather than directly due to MS changes.  I will have her do 2 days of IV Solu-Medrol 1 g/day 2.   Continue Adderall and Provigil for MS fatigue.   Flexeril nightly for muscle spasms and  insomnia 3.   Stay active and exercise.  Try to keep weight off 4.   She will return to see me in 6 months or sooner if she has new or worsening symptoms.   Bartlomiej Jenkinson A. Felecia Shelling, MD, PhD [id number], [id number] PM Certified in Neurology, Clinical Neurophysiology, Sleep Medicine, Pain Medicine and Neuroimaging  [facility] [address] [place], [place] [address] [phone]

## 2022-09-20 LAB — CBC WITH DIFFERENTIAL/PLATELET
Basophils Absolute: 0.1 10*3/uL (ref 0.0–0.2)
Basos: 1 %
EOS (ABSOLUTE): 0.1 10*3/uL (ref 0.0–0.4)
Eos: 2 %
Hematocrit: 40 % (ref 34.0–46.6)
Hemoglobin: 14.2 g/dL (ref 11.1–15.9)
Immature Grans (Abs): 0 10*3/uL (ref 0.0–0.1)
Immature Granulocytes: 0 %
Lymphocytes Absolute: 1.1 10*3/uL (ref 0.7–3.1)
Lymphs: 21 %
MCH: 34 pg — ABNORMAL HIGH (ref 26.6–33.0)
MCHC: 35.5 g/dL (ref 31.5–35.7)
MCV: 96 fL (ref 79–97)
Monocytes Absolute: 0.4 10*3/uL (ref 0.1–0.9)
Monocytes: 7 %
Neutrophils Absolute: 3.4 10*3/uL (ref 1.4–7.0)
Neutrophils: 69 %
Platelets: 276 10*3/uL (ref 150–450)
RBC: 4.18 x10E6/uL (ref 3.77–5.28)
RDW: 12.2 % (ref 11.7–15.4)
WBC: 5 10*3/uL (ref 3.4–10.8)

## 2022-09-20 LAB — COMPREHENSIVE METABOLIC PANEL
ALT: 21 IU/L (ref 0–32)
AST: 20 IU/L (ref 0–40)
Albumin/Globulin Ratio: 1.7 (ref 1.2–2.2)
Albumin: 4.4 g/dL (ref 3.8–4.9)
Alkaline Phosphatase: 88 IU/L (ref 44–121)
BUN/Creatinine Ratio: 24 — ABNORMAL HIGH (ref 9–23)
BUN: 21 mg/dL (ref 6–24)
Bilirubin Total: 0.7 mg/dL (ref 0.0–1.2)
CO2: 27 mmol/L (ref 20–29)
Calcium: 9.8 mg/dL (ref 8.7–10.2)
Chloride: 99 mmol/L (ref 96–106)
Creatinine, Ser: 0.86 mg/dL (ref 0.57–1.00)
Globulin, Total: 2.6 g/dL (ref 1.5–4.5)
Glucose: 104 mg/dL — ABNORMAL HIGH (ref 70–99)
Potassium: 5.2 mmol/L (ref 3.5–5.2)
Sodium: 139 mmol/L (ref 134–144)
Total Protein: 7 g/dL (ref 6.0–8.5)
eGFR: 79 mL/min/{1.73_m2} (ref >59)

## 2022-09-24 ENCOUNTER — Other Ambulatory Visit: Payer: Self-pay | Admitting: Neurology

## 2022-09-25 ENCOUNTER — Encounter: Payer: Self-pay | Admitting: Neurology

## 2022-10-21 ENCOUNTER — Other Ambulatory Visit: Payer: Self-pay | Admitting: Neurology

## 2022-10-23 ENCOUNTER — Encounter: Payer: Self-pay | Admitting: Neurology

## 2022-10-23 ENCOUNTER — Other Ambulatory Visit: Payer: Self-pay | Admitting: Neurology

## 2022-10-25 ENCOUNTER — Other Ambulatory Visit: Payer: Self-pay | Admitting: *Deleted

## 2022-10-25 ENCOUNTER — Other Ambulatory Visit: Payer: Self-pay | Admitting: Neurology

## 2022-10-25 MED ORDER — MODAFINIL 200 MG PO TABS: ORAL_TABLET | ORAL | 1 refills | Status: DC

## 2022-10-25 MED ORDER — AMPHETAMINE-DEXTROAMPHETAMINE 10 MG PO TABS: ORAL_TABLET | ORAL | 0 refills | Status: DC

## 2022-11-17 ENCOUNTER — Encounter: Payer: Self-pay | Admitting: Neurology

## 2022-11-17 ENCOUNTER — Other Ambulatory Visit: Payer: Self-pay | Admitting: Neurology

## 2022-12-13 ENCOUNTER — Encounter: Payer: Self-pay | Admitting: Neurology

## 2022-12-16 ENCOUNTER — Encounter: Payer: Self-pay | Admitting: Neurology

## 2022-12-19 NOTE — Telephone Encounter (Signed)
Gave completed/signed form back to medical records to process for pt. 

## 2022-12-21 ENCOUNTER — Telehealth: Payer: Self-pay | Admitting: Family Medicine

## 2022-12-21 NOTE — Telephone Encounter (Signed)
LVM and sent mychart msg informing pt of need to reschedule 04/05/23 appointment - NP out

## 2022-12-26 ENCOUNTER — Other Ambulatory Visit: Payer: Self-pay | Admitting: Neurology

## 2022-12-26 MED ORDER — AMPHETAMINE-DEXTROAMPHETAMINE 10 MG PO TABS: ORAL_TABLET | ORAL | 0 refills | Status: DC

## 2022-12-26 NOTE — Telephone Encounter (Signed)
Please call pt back. We cannot provide printed prescriptions of controlled medications. This is [place] law. We have to e-scribe. We can send to alternate pharmacy if she has one in the meantime?

## 2022-12-26 NOTE — Telephone Encounter (Signed)
Last seen 09/19/22. Per drug registry, last refilled 11/22/22 #90.

## 2022-12-26 NOTE — Telephone Encounter (Signed)
Pt is calling requesting a refill on  amphetamine-dextroamphetamine (ADDERALL) 10 MG tablet. Pt is requesting a prescription be writing out so she can pick up and take to [place]. Stated there e-script is down.

## 2022-12-27 ENCOUNTER — Encounter: Payer: Self-pay | Admitting: Neurology

## 2022-12-28 ENCOUNTER — Other Ambulatory Visit: Payer: Self-pay

## 2022-12-28 DIAGNOSIS — G35 Multiple sclerosis: Secondary | ICD-10-CM

## 2022-12-28 MED ORDER — FLUOXETINE HCL 40 MG PO CAPS: 40.0000 mg | ORAL_CAPSULE | Freq: Every day | ORAL | 1 refills | Status: DC

## 2022-12-28 MED ORDER — MELOXICAM 15 MG PO TABS: 15.0000 mg | ORAL_TABLET | Freq: Every day | ORAL | 1 refills | Status: DC

## 2022-12-28 MED ORDER — GABAPENTIN 300 MG PO CAPS: ORAL_CAPSULE | ORAL | 1 refills | Status: DC

## 2022-12-30 ENCOUNTER — Encounter: Payer: Self-pay | Admitting: Neurology

## 2023-01-11 ENCOUNTER — Telehealth: Payer: Self-pay

## 2023-01-11 ENCOUNTER — Other Ambulatory Visit (HOSPITAL_COMMUNITY): Payer: Self-pay

## 2023-01-11 NOTE — Telephone Encounter (Signed)
Received via FAX a PA form for Tecfidera-faxed completed form along with clinical notes to [phone].

## 2023-01-12 NOTE — Telephone Encounter (Signed)
[employer] (Bill) PA for [facility]  is not required due to already have PA on file until 10/31/2023. Would like a call from the nurse for permission to dismiss the request. Contact info: [phone]

## 2023-01-12 NOTE — Telephone Encounter (Signed)
Tried calling phone # twice, number not in service. Will send to PA team to see if they can cancel on their end.

## 2023-01-30 ENCOUNTER — Other Ambulatory Visit: Payer: Self-pay | Admitting: Neurology

## 2023-01-30 MED ORDER — AMPHETAMINE-DEXTROAMPHETAMINE 10 MG PO TABS: ORAL_TABLET | ORAL | 0 refills | Status: DC

## 2023-01-30 NOTE — Telephone Encounter (Signed)
Last seen on 09/19/22 No 6 month follow up scheduled  Rx last filled on 12/27/22 #90 (30 day supply) Rx pending to be signed

## 2023-02-02 ENCOUNTER — Other Ambulatory Visit: Payer: Self-pay | Admitting: Neurology

## 2023-02-02 NOTE — Telephone Encounter (Signed)
Last seen on 09/19/22 Follow up scheduled on 04/10/23 Last filled on 02/02/23 #30 tablets  Dr.Sater Rx was just refilled today, you will have to deny Rx (controlled Rx)

## 2023-02-16 IMAGING — CT CT HEAD W/O CM
3 series · 15 of 47 positions shown, 18 images · non-contrast
Comparison: Brain MRI 07/13/2021.  Head CT 06/11/2011.

CLINICAL DATA: Provided history: Headache, new or worsening.
Additional history provided: Slow gait, headache, dizziness,
fatigue. Diagnosis of shingles 1 week prior to arrival.



[Series 2: head wo · axial · 0.43mm/px · z∈[-188,-58]mm · 9 of 32 slices shown, 12 images]
[im 3/32  brain]
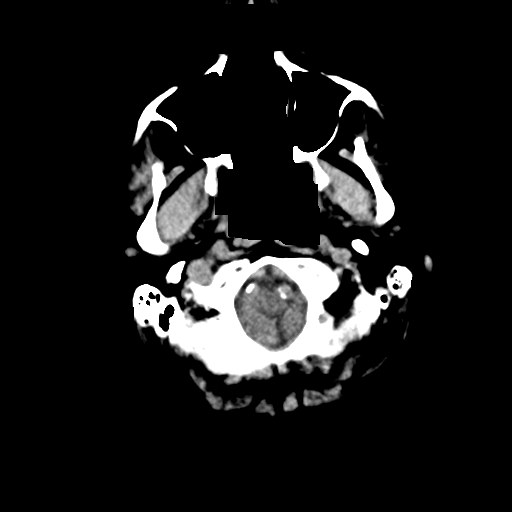
[im 3/32  bone]
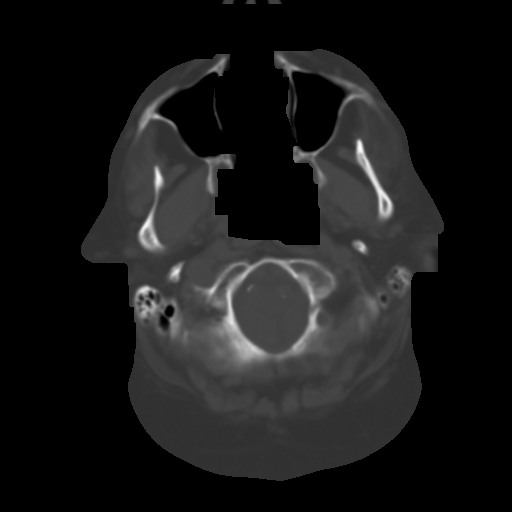
[im 6/32  brain]
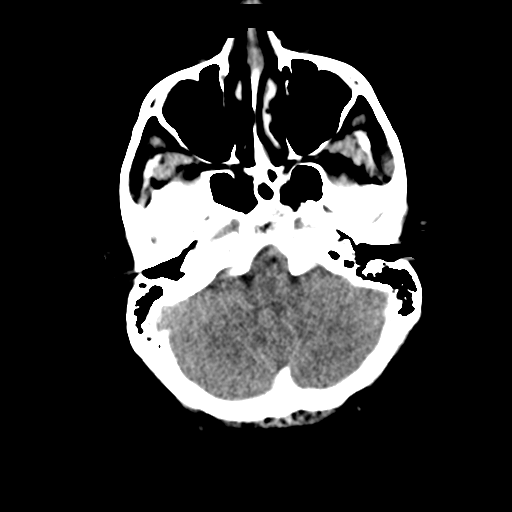
[im 9/32  brain]
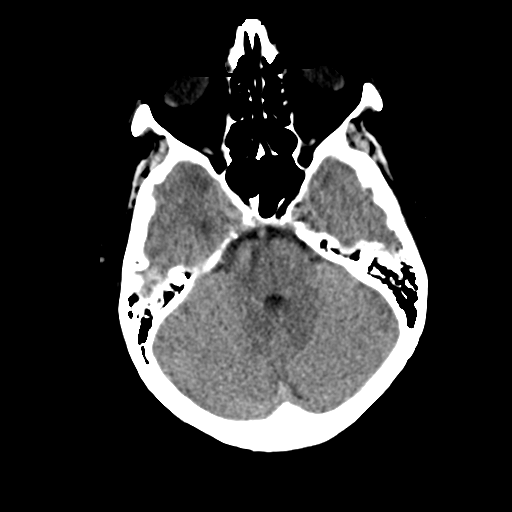
[im 12/32  brain]
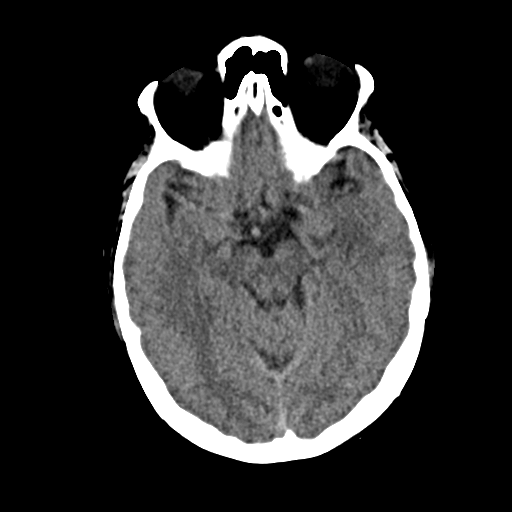
[im 17/32  brain]
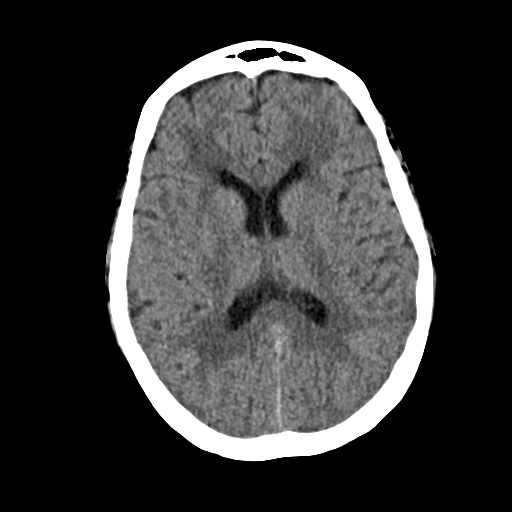
[im 17/32  bone]
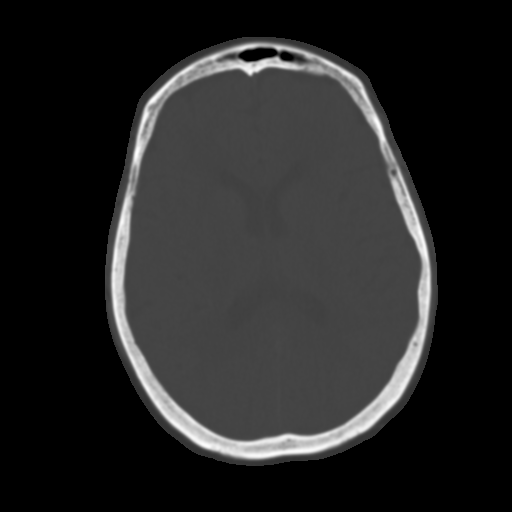
[im 20/32  brain]
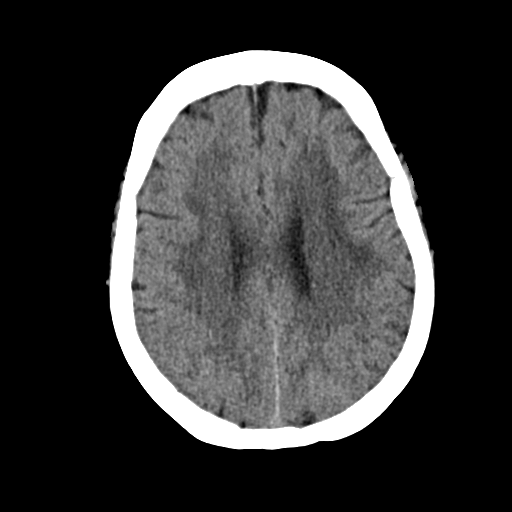
[im 23/32  brain]
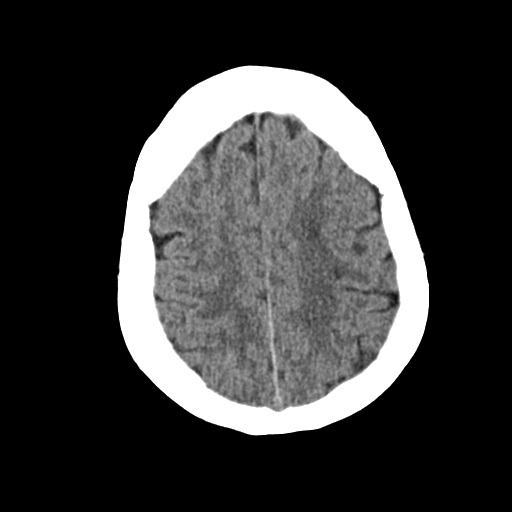
[im 26/32  brain]
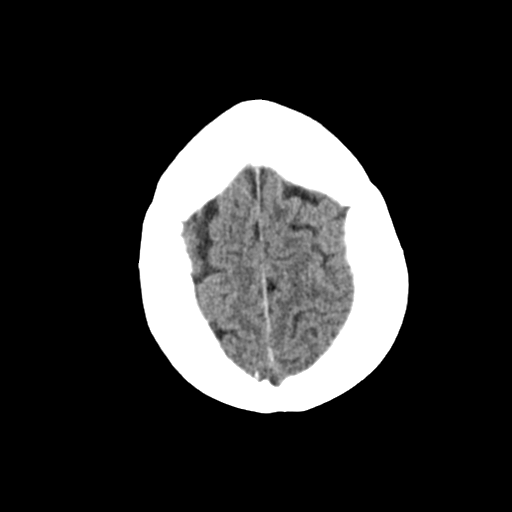
[im 29/32  brain]
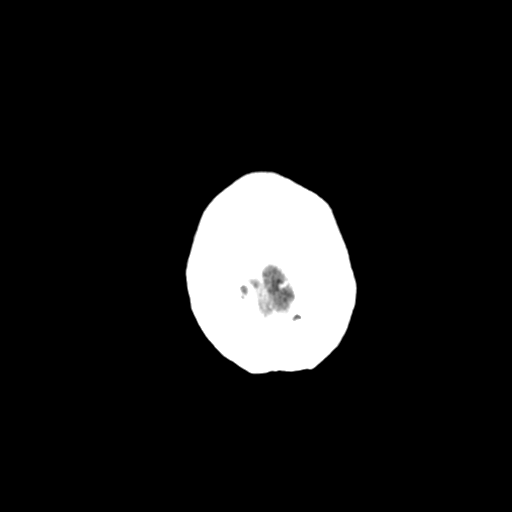
[im 29/32  bone]
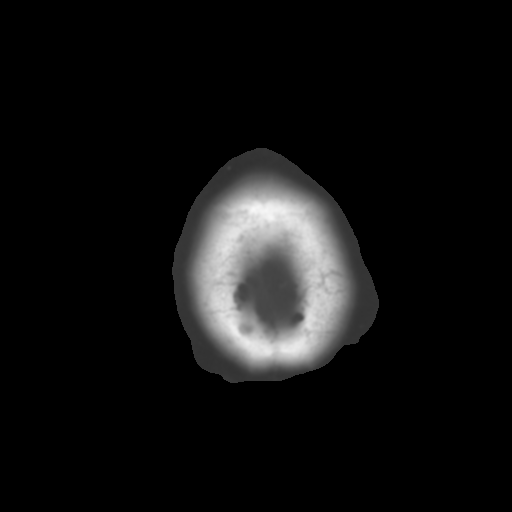

[Series 4: coronal soft · coronal · 0.32mm/px · 3 of 67 slices shown]
[im 23/67  brain]
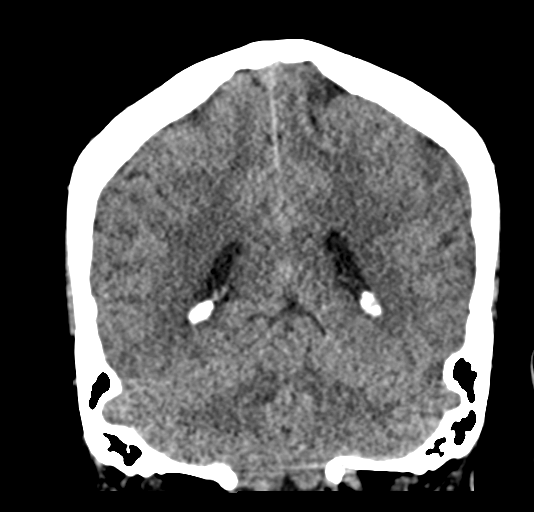
[im 30/67  brain]
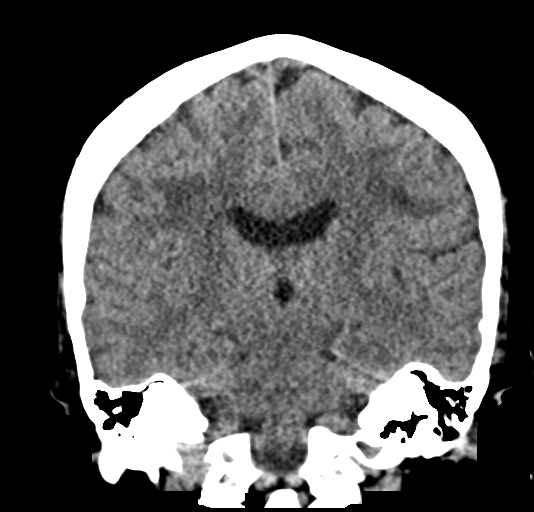
[im 37/67  brain]
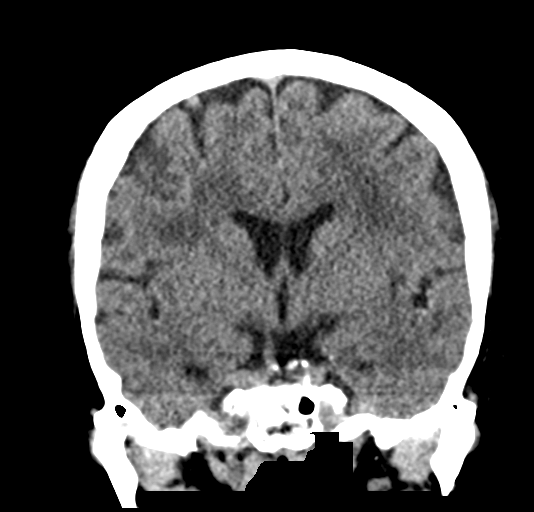

[Series 5: sag soft · sagittal · 0.32mm/px · 3 of 52 slices shown]
[im 18/52  brain]
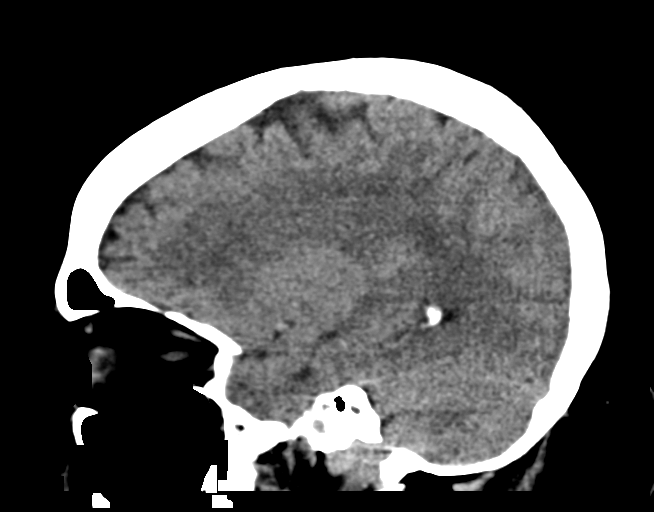
[im 26/52  brain]
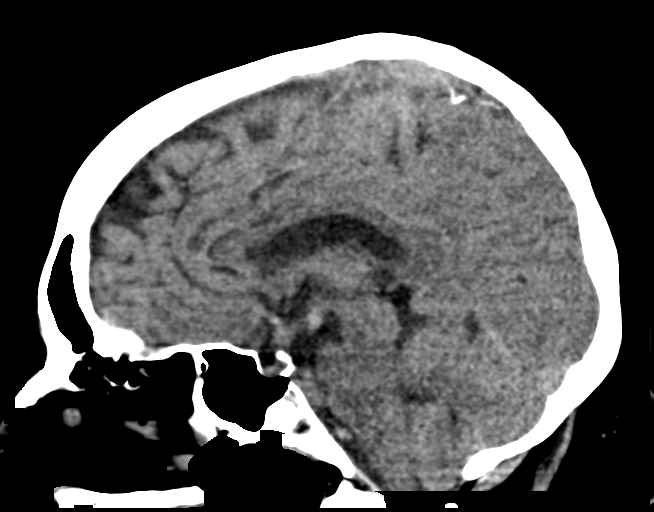
[im 35/52  brain]
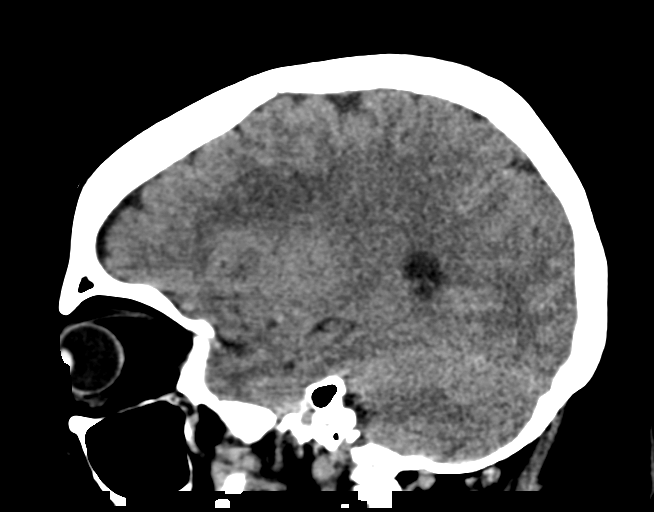

[15 of 47 positions shown; findings below may reference images not displayed]

FINDINGS: Brain:

No age advanced or lobar predominant parenchymal atrophy.

Age advanced patchy and ill-defined hypoattenuation within the
cerebral white matter, nonspecific but compatible with the patient's
history of multiple sclerosis.

Apparent subcentimeter focus of hypodensity within the right pons
(series 2, image 9) (series 4, image 33).

There is no acute intracranial hemorrhage.

No demarcated cortical infarct.

No extra-axial fluid collection.

No evidence of an intracranial mass.

No midline shift.

Vascular: No hyperdense vessel.  Atherosclerotic calcifications.

Skull: Normal. Negative for fracture or focal lesion.

Sinuses/Orbits: Visualized orbits show no acute finding. Mild
mucosal thickening within the right sphenoid sinus.
IMPRESSION: Apparent subcentimeter focus of abnormal hypodensity within the
right pons, which may reflect an age-indeterminate lacunar infarct,
a demyelinating lesion or artifact. Consider a brain MRI for further
evaluation.

Age-advanced cerebral white matter disease consistent with the
patient's history of multiple sclerosis.

Mild mucosal thickening within the right sphenoid sinus.

## 2023-03-06 ENCOUNTER — Encounter: Payer: Self-pay | Admitting: Neurology

## 2023-03-06 ENCOUNTER — Other Ambulatory Visit: Payer: Self-pay

## 2023-03-06 ENCOUNTER — Other Ambulatory Visit: Payer: Self-pay | Admitting: Neurology

## 2023-03-06 DIAGNOSIS — G4700 Insomnia, unspecified: Secondary | ICD-10-CM

## 2023-03-06 MED ORDER — AMPHETAMINE-DEXTROAMPHETAMINE 10 MG PO TABS: ORAL_TABLET | ORAL | 0 refills | Status: DC

## 2023-03-06 NOTE — Telephone Encounter (Signed)
Last Seen 09/19/2022 Upcoming Appointment 04/10/2023  Adderall Last Filled 01/30/2023

## 2023-03-07 MED ORDER — AMPHETAMINE-DEXTROAMPHETAMINE 10 MG PO TABS: ORAL_TABLET | ORAL | 0 refills | Status: DC

## 2023-03-07 NOTE — Telephone Encounter (Signed)
Requested Prescriptions   Pending Prescriptions Disp Refills   amphetamine-dextroamphetamine (ADDERALL) 10 MG tablet 90 tablet 0    Sig: Two or three po qd   Last seen 09/19/22 by sater, next appt scheduled 04/10/23 w/LOMAX Routing to provider  Dispenses   Dispensed Days Supply Quantity Provider Pharmacy  AMPHETA/DEXTRO COMBO 10MG  TAB 01/30/2023 30 90 each Sater, Pearletha Furl, MD [facility]...  AMPHETA/DEXTRO COMBO 10MG  TAB 12/27/2022 30 90 each Sater, Pearletha Furl, MD [facility]...  DEXTROAMPHETAMINE-AMPHETAMINE 10 MG TABLET 11/22/2022 30 90 tablet Sater, Pearletha Furl, MD [facility]  AMPHETAMINE-DEXTROAMPHETAMINE 10 MG TAB 10/21/2022 30 90 each Sater, Pearletha Furl, MD [facility]  AMPHETAMINE-DEXTROAMPHETAMINE 10 MG TAB 09/19/2022 30 90 each Dohmeier, Porfirio Mylar, MD [facility]  AMPHETAMINE-DEXTROAMPHETAMINE 10 MG TAB 08/10/2022 30 90 each Dohmeier, Porfirio Mylar, MD [facility]  AMPHETAMINE-DEXTROAMPHETAMINE 10 MG TAB 07/12/2022 30 90 each Sater, Pearletha Furl, MD [facility]  AMPHETAMINE-DEXTROAMPHETAMINE 10 MG TAB 06/13/2022 30 90 each Sater, Pearletha Furl, MD [facility]  AMPHETAMINE-DEXTROAMPHETAMINE 10 MG TAB 05/09/2022 30 90 each Lomax, Amy, NP [facility]  AMPHETAMINE-DEXTROAMPHETAMINE 10 MG TAB 04/04/2022 30 90 each Lomax, Amy, NP [facility].Marland KitchenMarland Kitchen

## 2023-03-16 ENCOUNTER — Other Ambulatory Visit: Payer: Self-pay | Admitting: *Deleted

## 2023-03-16 DIAGNOSIS — G35 Multiple sclerosis: Secondary | ICD-10-CM

## 2023-03-16 MED ORDER — TECFIDERA 240 MG PO CPDR: DELAYED_RELEASE_CAPSULE | ORAL | 1 refills | Status: DC

## 2023-03-30 ENCOUNTER — Encounter: Payer: Self-pay | Admitting: Neurology

## 2023-03-30 NOTE — Patient Instructions (Signed)

## 2023-03-30 NOTE — Progress Notes (Signed)
Chief Complaint  Patient presents with   Follow-up    RM 2, alone. DMT: Tecfidera.    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 She continues Tecfidera. She had Labs stable with last visit 08/2022. MRI stable 02/2022.   She reports doing well. No new or exacerbating symptoms. Gait is stable. No falls. She is now living with her mother following the passing of her father. She helps care for her three grandchildren. She is able to take care of her home and work in the yard, often. No assistive device.   She continues Adderall 10mg  with modafinil 100mg  TID for fatigue and cognitive deficits. She continues gabapentin 600mg  BID and duloxetine 60mg  daily for dysesthesias and mood. Meloxicam 15mg  daily helps with muscle spasms. Flexeril 5mg  takien rarely for insomnia and muscle cramps. She is taking Ativan 1mg  at bedtime for insomnia. Fluoxetine 40mg  for mood. She reports sleeping well. Mood is good.   She continues vitamin D supplements OTC. Thinks she takes 5000iu daily. She also takes MVI and vitamin C daily.   HISTORY (copied from Dr Bonnita Hollow previous note)  Hayley Kierstead is a 57 y.o. woman with relapsing remitting MS and related symptoms.     Update 09/19/2022: She had shingles March 2023 and Covid-19 n July and pneumonia 2 weeks ago.   She is on Tecfidera and her last lymphocyte count was normal at 1.5.   She has done her done Covid vaccinations 5 times, last one last month.  She re-did the pneumonia vaccine last month.  She will be oing RSV later this winter.       She feels her MS is stable.   No exacerbations.  She is on Tecfidera.   No recent flushing or stomach upset.Marland Kitchen             MRI brain 5/102023 showed moderate white matter foci, no change and no acute lesions.   Some foci are periventricular while others are more in the deep white matter.  MRI likely combination of demyelination and chronic microvascular ischemic change.     She has  vascular risks:  h/o tobacco and HTN (better after weight loss with Ozempic -- 60 pounds).     Last month her BP spiked and she had a bad HA.  She went to the ED and had a CT.  She threw up while trying to get an MRI.  She was told she had pneumonia  (Right basilar opacity on CXR).    She also has had shingles in the right flank and under breast that started around the same time.   She was given an anti-viral and gabapentin was increased (now 900 mg tid).     Lidocaine patches have not helped as much.    The rash is improving.   ;   She is walking well .   No recent falls   She sometimes veers to the left and sometimes hits the door frames. Legs are strong.    She has mild hand numbness and tingling and usually notes more in colder weather.    Gabapentin helps.       She note mild fatigue, better than last visit.   Usually she does best in spring and fall.   Modafinil and Adderall helps fatigue.    She notes mild cognitive issues with decreased memory and word finding issues at times.       MS History:   She  presented in 2000  with pain and tingling in the left arm. At that time, she had an MRI of the brain performed that was concerning for multiple sclerosis. Additionally, she had a lumbar puncture performed with CSF that was consistent with MS. Initially, she was placed on Avonex injections weekly. Initially, she did well with the Avonex but then had an MRI that showed some progression. About 2014 or 2015,  she switched to Tecfidera.     MRI of the brain 09/01/2017 showed multiple T2/FLAIR hyperintense foci in the periventricular, subcortical and deep white matter of both hemispheres and in the pons. Although most of the foci are nonspecific, some are radially oriented to the ventricles and others are in the juxtacortical white matter, consistent with chronic demyelinating plaque associated with multiple sclerosis. None of the foci appears to be acute and they do not enhance.   REVIEW OF SYSTEMS: Out  of a complete 14 system review of symptoms, the patient complains only of the following symptoms, arthritic pain, depression, anxiety, insomnia, falls, difficulty concentrating, fatigue and all other reviewed systems are negative.   ALLERGIES: No Known Allergies   HOME MEDICATIONS: Outpatient Medications Prior to Visit  Medication Sig Dispense Refill   amphetamine-dextroamphetamine (ADDERALL) 10 MG tablet TAKE 2 TO 3 TABLETS BY MOUTH ONCE DAILY 90 tablet 0   ASPIRIN 81 PO Take 1 tablet by mouth daily.     aspirin-acetaminophen-caffeine (EXCEDRIN MIGRAINE) 250-250-65 MG per tablet Take 1 tablet by mouth 3 (three) times daily as needed.     atorvastatin (LIPITOR) 40 MG tablet TAKE 1 TABLET BY MOUTH ONCE DAILY AT NIGHT  3   cyclobenzaprine (FLEXERIL) 5 MG tablet Take 1 tablet (5 mg total) by mouth at bedtime. 90 tablet 3   hydrochlorothiazide (HYDRODIURIL) 12.5 MG tablet Take 12.5 mg by mouth daily.     LORazepam (ATIVAN) 1 MG tablet TAKE ONE TABLET BY MOUTH EVERY NIGHT AT BEDTIME 30 tablet 5   LOSARTAN POTASSIUM PO Take 0.5 tablets by mouth daily.      modafinil (PROVIGIL) 200 MG tablet TAKE 1/2 TABLET BY MOUTH 3 TIMES DAILY 135 tablet 1   Multiple Vitamins-Calcium (ONE-A-DAY WOMENS FORMULA PO) Take 1 tablet by mouth daily.     omeprazole (PRILOSEC) 20 MG capsule      TECFIDERA 240 MG CPDR Take one capsule twice daily at 10am and 5pm. Pt must keep upcoming apt in May for future refills. 180 capsule 1   VITAMIN D PO Take by mouth.     DULoxetine (CYMBALTA) 60 MG capsule TAKE ONE CAPSULE BY MOUTH DAILY 90 capsule 2   FLUoxetine (PROZAC) 40 MG capsule Take 1 capsule (40 mg total) by mouth daily. 90 capsule 1   gabapentin (NEURONTIN) 300 MG capsule TAKE ONE (1) CAPSULE (BY MOUTH) FOUR TIMES EACH DAY 360 capsule 0   meloxicam (MOBIC) 15 MG tablet Take 1 tablet (15 mg total) by mouth daily. 90 tablet 1   No facility-administered medications prior to visit.     PAST MEDICAL HISTORY: Past  Medical History:  Diagnosis Date   Hypertension    Multiple sclerosis (HCC)    Neuropathy    Stroke (HCC)    Vision abnormalities      PAST SURGICAL HISTORY: Past Surgical History:  Procedure Laterality Date   ABDOMINAL HYSTERECTOMY     APPENDECTOMY     BREAST SURGERY     CHOLECYSTECTOMY     TONSILLECTOMY       FAMILY HISTORY: Family History  Problem Relation Age of Onset   Hypertension Mother    Hyperlipidemia Mother    Colitis Mother    Hypertension Father      SOCIAL HISTORY: Social History   Socioeconomic History   Marital status: Divorced    Spouse name: Not on file   Number of children: Not on file   Years of education: Not on file   Highest education level: Not on file  Occupational History   Not on file  Tobacco Use   Smoking status: Former    Types: Cigarettes   Smokeless tobacco: Never  Vaping Use   Vaping Use: Every day  Substance and Sexual Activity   Alcohol use: No    Alcohol/week: 0.0 standard drinks of alcohol   Drug use: No   Sexual activity: Not on file  Other Topics Concern   Not on file  Social History Narrative   Right handed    Social Determinants of Health   Financial Resource Strain: Not on file  Food Insecurity: Not on file  Transportation Needs: Not on file  Physical Activity: Not on file  Stress: Not on file  Social Connections: Not on file  Intimate Partner Violence: Not on file      PHYSICAL EXAM  Vitals:   04/10/23 0840  BP: 110/70  Pulse: 74  SpO2: 96%  Weight: 148 lb 8 oz (67.4 kg)  Height: 5\' 4"  (1.626 m)      Body mass index is 25.49 kg/m.   Generalized: Well developed, in no acute distress  Cardiology: normal rate and rhythm, no murmur auscultated  Respiratory: clear to auscultation bilaterally    Neurological examination  Mentation: Alert oriented to time, place, history taking. Follows all commands speech and language fluent Cranial nerve II-XII: Pupils were equal round reactive to  light. Extraocular movements were full, visual field were full on confrontational test. Facial sensation and strength were normal. Head turning and shoulder shrug  were normal and symmetric. Motor: The motor testing reveals 5 over 5 strength of all 4 extremities. Good symmetric motor tone is noted throughout. No nuchal rigidity.  Sensory: Sensory testing is intact to soft touch on all 4 extremities. No evidence of extinction is noted.  Coordination: Cerebellar testing reveals good finger-nose-finger and heel-to-shin bilaterally.  Gait and station: Station normal. Gait is normal, Tandem very mildly unsteady, no assistive device. No foot drop noted today.  Reflexes: Deep tendon reflexes are symmetric and normal bilaterally.  Skin: vesicular rash, approx 6" wide band, noted of right thorax, wrapping around to back, stops at spinal cord. No obvious concerns of infection noted.     DIAGNOSTIC DATA (LABS, IMAGING, TESTING) - I reviewed patient records, labs, notes, testing and imaging myself where available.  Lab Results  Component Value Date   WBC 5.0 09/19/2022   HGB 14.2 09/19/2022   HCT 40.0 09/19/2022   MCV 96 09/19/2022   PLT 276 09/19/2022      Component Value Date/Time   NA 139 09/19/2022 1437   K 5.2 09/19/2022 1437   CL 99 09/19/2022 1437   CO2 27 09/19/2022 1437   GLUCOSE 104 (H) 09/19/2022 1437   GLUCOSE 123 (H) 02/20/2022 1203   BUN 21 09/19/2022 1437   CREATININE 0.86 09/19/2022 1437   CALCIUM 9.8 09/19/2022 1437   PROT 7.0 09/19/2022 1437   ALBUMIN 4.4 09/19/2022 1437   AST 20 09/19/2022 1437   ALT 21 09/19/2022 1437   ALKPHOS 88 09/19/2022 1437   BILITOT 0.7 09/19/2022  1437   GFRNONAA >60 02/20/2022 1203   GFRAA 95 03/11/2020 1409   No results found for: "CHOL", "HDL", "LDLCALC", "LDLDIRECT", "TRIG", "CHOLHDL" No results found for: "HGBA1C" Lab Results  Component Value Date   VITAMINB12 990 06/29/2021   Lab Results  Component Value Date   TSH 1.650  06/29/2021        No data to display               No data to display           ASSESSMENT AND PLAN  57 y.o. year old female  has a past medical history of Hypertension, Multiple sclerosis (HCC), Neuropathy, Stroke (HCC), and Vision abnormalities. here with   Relapsing remitting multiple sclerosis (HCC) - Plan: CBC with Differential/Platelets, Hepatic Function Panel, gabapentin (NEURONTIN) 300 MG capsule  High risk medication use  Chronic fatigue  Insomnia, unspecified type  Gait disturbance  Dysesthesia  Vitamin D deficiency - Plan: Vitamin D, 25-hydroxy   Tiffany reports doing well. We will update labs, today. She will continue Adderall 10mg  TID, modafinil 100mg  TID, gabapentin 600mg  BID, meloxicam 15mg  daily, duloxetine 60mg  daily, fluoxetine 40mg  daily and lorazepam 1mg  QHS and cyclobenzaprine 5mg  QHS as needed. PDMP appropriate. Healthy lifestyle habits encouraged. She will continue close follow up with PCP. She will return to see Dr Epimenio Foot in 6 months. She verbalizes understanding and agreement with this plan.    Orders Placed This Encounter  Procedures   CBC with Differential/Platelets   Hepatic Function Panel   Vitamin D, 25-hydroxy     Meds ordered this encounter  Medications   DULoxetine (CYMBALTA) 60 MG capsule    Sig: Take 1 capsule (60 mg total) by mouth daily.    Dispense:  90 capsule    Refill:  1    Order Specific Question:   Supervising Provider    Answer:   Anson Fret [[id number]]   FLUoxetine (PROZAC) 40 MG capsule    Sig: Take 1 capsule (40 mg total) by mouth daily.    Dispense:  90 capsule    Refill:  1    ~AUTOMATION Refill~AUTOMATION Refill~AUTOMATION Refill    Order Specific Question:   Supervising Provider    Answer:   Anson Fret [[id number]]   meloxicam (MOBIC) 15 MG tablet    Sig: Take 1 tablet (15 mg total) by mouth daily.    Dispense:  90 tablet    Refill:  1    ~AUTOMATION Refill~AUTOMATION Refill~AUTOMATION Refill     Order Specific Question:   Supervising Provider    Answer:   Anson Fret [[id number]]   gabapentin (NEURONTIN) 300 MG capsule    Sig: Take 2 capsules (600 mg total) by mouth 2 (two) times daily.    Dispense:  360 capsule    Refill:  1    Order Specific Question:   Supervising Provider    Answer:   Anson Fret [[id number]]      EXB MWUXL, MSN, FNP-C 04/10/2023, 9:32 AM  [facility] [address], [address] [place], [place] [address] [phone]

## 2023-04-03 ENCOUNTER — Other Ambulatory Visit: Payer: Self-pay | Admitting: Neurology

## 2023-04-03 DIAGNOSIS — G4700 Insomnia, unspecified: Secondary | ICD-10-CM

## 2023-04-04 NOTE — Telephone Encounter (Signed)
Pt last seen on 09/19/22 Follow up scheduled on 04/10/23 Last filled on 03/06/23 #90 tablets (30 day supply)  Rx pending to be signed

## 2023-04-05 ENCOUNTER — Ambulatory Visit: Payer: PPO | Admitting: Family Medicine

## 2023-04-07 ENCOUNTER — Other Ambulatory Visit: Payer: Self-pay | Admitting: Neurology

## 2023-04-07 DIAGNOSIS — G35 Multiple sclerosis: Secondary | ICD-10-CM

## 2023-04-10 ENCOUNTER — Encounter: Payer: Self-pay | Admitting: Family Medicine

## 2023-04-10 ENCOUNTER — Ambulatory Visit (INDEPENDENT_AMBULATORY_CARE_PROVIDER_SITE_OTHER): Payer: PPO | Admitting: Family Medicine

## 2023-04-10 VITALS — BP 110/70 | HR 74 | Ht 64.0 in | Wt 148.5 lb

## 2023-04-10 DIAGNOSIS — Z79899 Other long term (current) drug therapy: Secondary | ICD-10-CM | POA: Diagnosis not present

## 2023-04-10 DIAGNOSIS — R5382 Chronic fatigue, unspecified: Secondary | ICD-10-CM

## 2023-04-10 DIAGNOSIS — G35A Relapsing-remitting multiple sclerosis: Secondary | ICD-10-CM

## 2023-04-10 DIAGNOSIS — G35 Multiple sclerosis: Secondary | ICD-10-CM | POA: Diagnosis not present

## 2023-04-10 DIAGNOSIS — R208 Other disturbances of skin sensation: Secondary | ICD-10-CM

## 2023-04-10 DIAGNOSIS — R269 Unspecified abnormalities of gait and mobility: Secondary | ICD-10-CM

## 2023-04-10 DIAGNOSIS — G4700 Insomnia, unspecified: Secondary | ICD-10-CM | POA: Diagnosis not present

## 2023-04-10 DIAGNOSIS — E559 Vitamin D deficiency, unspecified: Secondary | ICD-10-CM

## 2023-04-10 MED ORDER — MELOXICAM 15 MG PO TABS: 15.0000 mg | ORAL_TABLET | Freq: Every day | ORAL | 1 refills | Status: DC

## 2023-04-10 MED ORDER — FLUOXETINE HCL 40 MG PO CAPS: 40.0000 mg | ORAL_CAPSULE | Freq: Every day | ORAL | 1 refills | Status: DC

## 2023-04-10 MED ORDER — GABAPENTIN 300 MG PO CAPS: 600.0000 mg | ORAL_CAPSULE | Freq: Two times a day (BID) | ORAL | 1 refills | Status: DC

## 2023-04-10 MED ORDER — DULOXETINE HCL 60 MG PO CPEP: 60.0000 mg | ORAL_CAPSULE | Freq: Every day | ORAL | 1 refills | Status: DC

## 2023-04-10 NOTE — Telephone Encounter (Signed)
Last seen on 09/19/22 Follow up scheduled on 04/10/23 Modafinal last filled on 01/09/23 #135 tablets (90 day supply) Rx pending to be signed

## 2023-04-11 LAB — CBC WITH DIFFERENTIAL/PLATELET
Basophils Absolute: 0 10*3/uL (ref 0.0–0.2)
Basos: 1 %
EOS (ABSOLUTE): 0.1 10*3/uL (ref 0.0–0.4)
Eos: 2 %
Hematocrit: 39.1 % (ref 34.0–46.6)
Hemoglobin: 12.9 g/dL (ref 11.1–15.9)
Immature Grans (Abs): 0 10*3/uL (ref 0.0–0.1)
Immature Granulocytes: 0 %
Lymphocytes Absolute: 1 10*3/uL (ref 0.7–3.1)
Lymphs: 30 %
MCH: 32.3 pg (ref 26.6–33.0)
MCHC: 33 g/dL (ref 31.5–35.7)
MCV: 98 fL — ABNORMAL HIGH (ref 79–97)
Monocytes Absolute: 0.3 10*3/uL (ref 0.1–0.9)
Monocytes: 10 %
Neutrophils Absolute: 1.8 10*3/uL (ref 1.4–7.0)
Neutrophils: 57 %
Platelets: 202 10*3/uL (ref 150–450)
RBC: 3.99 x10E6/uL (ref 3.77–5.28)
RDW: 12.8 % (ref 11.7–15.4)
WBC: 3.2 10*3/uL — ABNORMAL LOW (ref 3.4–10.8)

## 2023-04-11 LAB — HEPATIC FUNCTION PANEL
ALT: 27 IU/L (ref 0–32)
AST: 24 IU/L (ref 0–40)
Albumin: 4.5 g/dL (ref 3.8–4.9)
Alkaline Phosphatase: 82 IU/L (ref 44–121)
Bilirubin Total: 0.5 mg/dL (ref 0.0–1.2)
Bilirubin, Direct: 0.14 mg/dL (ref 0.00–0.40)
Total Protein: 6.6 g/dL (ref 6.0–8.5)

## 2023-04-11 LAB — VITAMIN D 25 HYDROXY (VIT D DEFICIENCY, FRACTURES): Vit D, 25-Hydroxy: 110 ng/mL — ABNORMAL HIGH (ref 30.0–100.0)

## 2023-05-04 ENCOUNTER — Other Ambulatory Visit: Payer: Self-pay | Admitting: Neurology

## 2023-05-04 DIAGNOSIS — G4700 Insomnia, unspecified: Secondary | ICD-10-CM

## 2023-05-09 NOTE — Telephone Encounter (Signed)
Pt last seen on 04/10/23 Follow up scheduled on 10/19/23 Last filled on 04/04/23 #90 tablets (30 day supply Rx pending to be signed

## 2023-06-08 ENCOUNTER — Other Ambulatory Visit: Payer: Self-pay | Admitting: Neurology

## 2023-06-08 DIAGNOSIS — G4700 Insomnia, unspecified: Secondary | ICD-10-CM

## 2023-06-08 NOTE — Telephone Encounter (Signed)
Last seen on 04/10/23 Follow up scheduled on 10/19/23 Last filled on 05/09/23 #90 tablets( 30 day supply) Rx pending to be signed

## 2023-06-19 ENCOUNTER — Other Ambulatory Visit: Payer: Self-pay | Admitting: Neurology

## 2023-06-28 ENCOUNTER — Other Ambulatory Visit: Payer: Self-pay | Admitting: Neurology

## 2023-06-28 DIAGNOSIS — G4700 Insomnia, unspecified: Secondary | ICD-10-CM

## 2023-06-28 NOTE — Telephone Encounter (Signed)
Last seen on 04/10/23 Follow up scheduled on 10/19/23 Adderall last filled on 06/08/23 #90 tablets (30 day supply) Rx pending to be signed

## 2023-07-04 ENCOUNTER — Other Ambulatory Visit: Payer: Self-pay | Admitting: Neurology

## 2023-07-04 NOTE — Telephone Encounter (Signed)
Last seen on 04/10/23  Follow up scheduled on 10/19/23 Rx was sent on 04/10/23 for 90 tablets with 1 additional refill for 90 day supply to mail order.  90 day supply sent to [facility]

## 2023-07-08 ENCOUNTER — Other Ambulatory Visit: Payer: Self-pay | Admitting: Neurology

## 2023-07-12 NOTE — Telephone Encounter (Signed)
Last seen on 04/10/23 Follow up scheduled on 10/19/23

## 2023-07-31 ENCOUNTER — Other Ambulatory Visit: Payer: Self-pay | Admitting: Neurology

## 2023-07-31 DIAGNOSIS — G35 Multiple sclerosis: Secondary | ICD-10-CM

## 2023-07-31 NOTE — Telephone Encounter (Signed)
Last seen on 04/10/23 Follow up scheduled on 10/19/23

## 2023-08-05 ENCOUNTER — Other Ambulatory Visit: Payer: Self-pay | Admitting: Neurology

## 2023-08-05 DIAGNOSIS — G4700 Insomnia, unspecified: Secondary | ICD-10-CM

## 2023-08-07 NOTE — Telephone Encounter (Signed)
Last seen on 04/10/23 Follow up scheduled on 10/19/23 Last filled on 07/08/23 #30 tablets  Rx pending to be signed

## 2023-08-28 ENCOUNTER — Other Ambulatory Visit: Payer: Self-pay | Admitting: Neurology

## 2023-08-28 NOTE — Telephone Encounter (Signed)
Last seen on 04/10/23 Follow up scheduled on 10/19/23 Last filled on 07/29/23 #30 tablets (30 day supply) Rx pending to be signed

## 2023-09-13 ENCOUNTER — Other Ambulatory Visit: Payer: Self-pay | Admitting: Neurology

## 2023-09-13 DIAGNOSIS — G4700 Insomnia, unspecified: Secondary | ICD-10-CM

## 2023-09-13 NOTE — Telephone Encounter (Signed)
Last seen on 04/10/23 Follow up scheduled on 10/19/23 Last filled on 08/07/23 Rx pending to be signed

## 2023-09-18 ENCOUNTER — Other Ambulatory Visit: Payer: Self-pay | Admitting: Neurology

## 2023-09-18 DIAGNOSIS — G35 Multiple sclerosis: Secondary | ICD-10-CM

## 2023-09-18 NOTE — Telephone Encounter (Signed)
Last seen on 04/10/23 Follow up scheduled on 10/19/23

## 2023-10-02 ENCOUNTER — Other Ambulatory Visit: Payer: Self-pay | Admitting: Neurology

## 2023-10-02 DIAGNOSIS — G4700 Insomnia, unspecified: Secondary | ICD-10-CM

## 2023-10-03 ENCOUNTER — Other Ambulatory Visit: Payer: Self-pay

## 2023-10-03 MED ORDER — FLUOXETINE HCL 40 MG PO CAPS: 40.0000 mg | ORAL_CAPSULE | Freq: Every day | ORAL | 0 refills | Status: DC

## 2023-10-03 NOTE — Telephone Encounter (Signed)
Last seen on 04/10/23 Follow up scheduled on  Adderall last filled on 09/13/23 #90 tablets (30 day supply) Modafinil last filled on 07/04/23 #135 tablets (90 day supply) Rx pending to be signed   I called patient to confirm she is not out adderall and she is not. Pt said sent refill request for all medications at once. I did adjust the date for next refill for adderrall for 10/13/23.

## 2023-10-05 ENCOUNTER — Telehealth: Payer: Self-pay | Admitting: Neurology

## 2023-10-05 DIAGNOSIS — G35 Multiple sclerosis: Secondary | ICD-10-CM

## 2023-10-05 NOTE — Telephone Encounter (Signed)
Sam from [facility] left VM in regards to a refill request they sent for patient on Monday morning for gabapentin. They haven't gotten any word back and requested a return call to have it filled or it can be sent electronically. Best call back number is [phone]

## 2023-10-05 NOTE — Telephone Encounter (Signed)
I called [facility] and spoke with pharmacy tech and they have Rx from 09/18/23 for #360 tablets (90 day supply)   No Rx is needed at this time. Encounter closed.

## 2023-10-19 ENCOUNTER — Ambulatory Visit: Payer: PPO | Admitting: Neurology

## 2023-10-26 ENCOUNTER — Other Ambulatory Visit: Payer: Self-pay | Admitting: Neurology

## 2023-10-26 ENCOUNTER — Other Ambulatory Visit: Payer: Self-pay

## 2023-11-06 ENCOUNTER — Other Ambulatory Visit: Payer: Self-pay | Admitting: Neurology

## 2023-11-06 DIAGNOSIS — G4700 Insomnia, unspecified: Secondary | ICD-10-CM

## 2023-11-06 NOTE — Telephone Encounter (Signed)
 Last seen on 04/10/23 Follow up scheduled on 05/08/24 Last filled on 04/04/23 #90 tablets (30 day supply) Rx pending to be signed

## 2023-11-13 ENCOUNTER — Other Ambulatory Visit: Payer: Self-pay | Admitting: Neurology

## 2023-11-13 DIAGNOSIS — G4700 Insomnia, unspecified: Secondary | ICD-10-CM

## 2023-11-22 NOTE — Telephone Encounter (Signed)
Last seen 04/10/23 and next f/u 05/08/24.   However, Dr. Epimenio Foot last refilled 11/06/23 #90. I called [facility].  She filled this on 11/13/23 #90.

## 2023-11-27 ENCOUNTER — Telehealth: Payer: Self-pay | Admitting: *Deleted

## 2023-11-27 NOTE — Telephone Encounter (Signed)
LVM for pt to call.   Veronica D. at check in also LVM earlier offering appt 11/28/23 at 1pm with Dr. Epimenio Foot.

## 2023-11-28 ENCOUNTER — Other Ambulatory Visit: Payer: Self-pay | Admitting: Neurology

## 2023-12-05 ENCOUNTER — Telehealth: Payer: Self-pay

## 2023-12-05 NOTE — Telephone Encounter (Signed)
Faxed Form off to [facility] [phone]

## 2023-12-12 NOTE — Telephone Encounter (Signed)
Faxed urgent appeal to [employer] at [phone]. Received fax confirmation, waiting on determination.

## 2023-12-12 NOTE — Telephone Encounter (Signed)
Called [employer] at [phone] and spoke w/ Myra to check on status of PA faxed in.  PA denied d/t not getting form back within 72 hr (not 72hr business days). Explained we received 11/30/23. Not open Fridays and form faxed back on 12/05/23 and they confirmed they received but will not review. States next step is to appeal denial from 12/04/23. We will proceed with urgent appeal.

## 2023-12-13 ENCOUNTER — Other Ambulatory Visit: Payer: Self-pay | Admitting: Neurology

## 2023-12-13 DIAGNOSIS — G4700 Insomnia, unspecified: Secondary | ICD-10-CM

## 2023-12-13 NOTE — Telephone Encounter (Signed)
Called back. Relayed we already submitted appeal yesterday for brand Tecfidera, waiting on determination.   If appeal denied, Alan Ripper states pt can get generic dimethyl fumarate for 0.00 copay.

## 2023-12-13 NOTE — Telephone Encounter (Signed)
Veronica Frazier from [facility] [place] has called from the dept of [employer]. Wanting to discuss an opportunity to save pt a lot of money by switching her to generic for  TECFIDERA 240 MG CPDR .  She can be reached at [phone] secure vm

## 2023-12-14 ENCOUNTER — Other Ambulatory Visit: Payer: Self-pay

## 2023-12-14 NOTE — Telephone Encounter (Signed)
Pt Last Sseen 04/10/2023 Upcoming Appointment 05/08/2024  Adderall Last Filled 11/13/2023 Escript 12/14/2023

## 2023-12-19 ENCOUNTER — Encounter: Payer: Self-pay | Admitting: Neurology

## 2023-12-19 ENCOUNTER — Other Ambulatory Visit: Payer: Self-pay | Admitting: Neurology

## 2023-12-19 DIAGNOSIS — G4700 Insomnia, unspecified: Secondary | ICD-10-CM

## 2023-12-19 MED ORDER — AMPHETAMINE-DEXTROAMPHETAMINE 15 MG PO TABS: ORAL_TABLET | ORAL | 0 refills | Status: DC

## 2023-12-20 NOTE — Telephone Encounter (Signed)
Called pt and LVM letting her know appeal approved for brand name Tecfidera. Should be able to fill through pharmacy now. Asked her to call back if any questions.

## 2023-12-20 NOTE — Telephone Encounter (Signed)
called and spoke to insurance company and they stated that its been approved from 12/14/23 thru 12/13/24. asked for approval letter.

## 2024-01-02 ENCOUNTER — Other Ambulatory Visit: Payer: Self-pay | Admitting: Neurology

## 2024-01-03 ENCOUNTER — Other Ambulatory Visit: Payer: Self-pay | Admitting: Neurology

## 2024-01-03 NOTE — Telephone Encounter (Signed)
 Last seen 04/10/23 and next f/u 05/08/24. Last refilled 04/10/23 #135.

## 2024-01-03 NOTE — Telephone Encounter (Signed)
 Last seen 04/10/23, next appt 05/08/24  Dispenses   Dispensed Days Supply Quantity Provider Pharmacy  MODAFINIL 200 MG TAB 04/10/2023 90 135 each Sater, Pearletha Furl, MD [facility].Marland KitchenMarland Kitchen

## 2024-01-08 ENCOUNTER — Telehealth: Payer: Self-pay | Admitting: Family Medicine

## 2024-01-08 DIAGNOSIS — G35 Multiple sclerosis: Secondary | ICD-10-CM

## 2024-01-08 MED ORDER — GABAPENTIN 300 MG PO CAPS
300.0000 mg | ORAL_CAPSULE | Freq: Four times a day (QID) | ORAL | 1 refills | Status: DC
Start: 2024-01-08 — End: 2024-07-09

## 2024-01-08 NOTE — Telephone Encounter (Signed)
 Last seen 04/10/23 and next f/u 05/08/24. Per notes, pt takes gabapentin 600mg  BID. Last refill sent 09/18/23 #360 (90 days supply). E-scribed refill.

## 2024-01-08 NOTE — Telephone Encounter (Signed)
 Pt is requesting a refill for gabapentin (NEURONTIN) 300 MG capsule .  Pharmacy: [facility]

## 2024-01-09 ENCOUNTER — Other Ambulatory Visit (HOSPITAL_COMMUNITY): Payer: Self-pay

## 2024-01-09 ENCOUNTER — Telehealth: Payer: Self-pay

## 2024-01-09 NOTE — Telephone Encounter (Signed)
 Pharmacy Patient Advocate Encounter   Received notification from CoverMyMeds that prior authorization for Modafinil 200MG  tablets is required/requested.   Insurance verification completed.   The patient is insured through [employer]  .   Per test claim: PA required; PA submitted to above mentioned insurance via CoverMyMeds Key/confirmation #/EOC [facility] Status is pending

## 2024-01-10 NOTE — Telephone Encounter (Signed)
 Vikki Ports from [place] called informing the office that the pt's Modafinil 200mg  was denied due to dx codes not being on the list. She will be faxing over the information.

## 2024-01-10 NOTE — Telephone Encounter (Signed)
 Insurance Faxed for More Information was faxed in on 01/10/2024

## 2024-01-17 ENCOUNTER — Other Ambulatory Visit: Payer: Self-pay | Admitting: Neurology

## 2024-01-17 DIAGNOSIS — G4700 Insomnia, unspecified: Secondary | ICD-10-CM

## 2024-01-17 NOTE — Telephone Encounter (Signed)
 Last seen 04/10/23, next appt scheduled 05/08/24 Dispenses   Dispensed Days Supply Quantity Provider Pharmacy  AMPHETAMINE-DEXTROAMPHETAMINE 15 MG AMPHETAMINE-DEXTROAMPHETAMINE 15 MG  TABS 12/19/2023 30 60 tablet Sater, Pearletha Furl, MD [facility]  AMPHETAMINE-DEXTROAMPHETAMINE 10 MG AMPHETAMINE-DEXTROAMPHETAMINE 10 MG  TABS 11/13/2023 30 90 tablet Sater, Pearletha Furl, MD [facility]  AMPHETAMINE-DEXTROAMPHETAMINE 10 MG TAB 04/04/2023 30 90 each Sater, Pearletha Furl, MD [facility]  AMPHETA/DEXTRO COMBO 10MG  TAB 01/30/2023 30 90 each Sater, Pearletha Furl, MD [facility].Marland KitchenMarland Kitchen

## 2024-03-02 ENCOUNTER — Other Ambulatory Visit: Payer: Self-pay | Admitting: Neurology

## 2024-03-04 NOTE — Telephone Encounter (Signed)
 Last seen on 04/10/23 Follow up scheduled on 05/08/24   Dispensed Days Supply Quantity Provider Pharmacy  LORAZEPAM  1 MG TABS 02/02/2024 30 30 tablet Sater, Sherida Dimmer, MD [facility]     Rx pending to be signed

## 2024-03-18 ENCOUNTER — Other Ambulatory Visit: Payer: Self-pay | Admitting: Neurology

## 2024-03-18 DIAGNOSIS — G4700 Insomnia, unspecified: Secondary | ICD-10-CM

## 2024-03-19 ENCOUNTER — Telehealth: Payer: Self-pay

## 2024-03-19 ENCOUNTER — Encounter: Payer: Self-pay | Admitting: Neurology

## 2024-03-19 NOTE — Telephone Encounter (Signed)
 Last seen on 04/10/23 Follow up scheduled 05/08/24   Dispensed Days Supply Quantity Provider Pharmacy  AMPHETAMINE -DEXTROAMPHETAMINE  15 MG AMPHETAMINE -DEXTROAMPHETAMINE  15 MG  TABS 02/14/2024 30 60 tablet Sater, Sherida Dimmer, MD [facility]      Rx pending to be signed

## 2024-04-08 ENCOUNTER — Other Ambulatory Visit: Payer: Self-pay

## 2024-04-08 ENCOUNTER — Encounter: Payer: Self-pay | Admitting: Neurology

## 2024-04-08 ENCOUNTER — Other Ambulatory Visit: Payer: Self-pay | Admitting: Neurology

## 2024-04-08 DIAGNOSIS — G35 Multiple sclerosis: Secondary | ICD-10-CM

## 2024-04-09 NOTE — Telephone Encounter (Signed)
 Last seen on 04/10/23 Follow up scheduled on 05/08/24

## 2024-04-16 ENCOUNTER — Other Ambulatory Visit: Payer: Self-pay

## 2024-04-16 MED ORDER — FLUOXETINE HCL 40 MG PO CAPS
40.0000 mg | ORAL_CAPSULE | Freq: Every day | ORAL | 0 refills | Status: DC
Start: 2024-04-16 — End: 2024-06-28

## 2024-04-18 ENCOUNTER — Other Ambulatory Visit: Payer: Self-pay | Admitting: Neurology

## 2024-04-18 ENCOUNTER — Telehealth: Payer: Self-pay

## 2024-04-18 ENCOUNTER — Other Ambulatory Visit: Payer: Self-pay

## 2024-04-18 DIAGNOSIS — G4700 Insomnia, unspecified: Secondary | ICD-10-CM

## 2024-04-18 MED ORDER — AMPHETAMINE-DEXTROAMPHETAMINE 15 MG PO TABS: 1.0000 | ORAL_TABLET | Freq: Two times a day (BID) | ORAL | 0 refills | Status: DC

## 2024-04-18 NOTE — Telephone Encounter (Signed)
 PLEASE REFUSE MEDICATION FILLED TODAY 04/18/2024

## 2024-04-18 NOTE — Telephone Encounter (Signed)
 Sent phone note to Dr. Godwin Lat for him to fill.

## 2024-04-18 NOTE — Telephone Encounter (Signed)
 Called pharmacy to cancel 1 of the 2 refills Dr. Godwin Lat sent to [facility] for pt. Rep was very nice and canceled script while I was on the phone with her.

## 2024-04-18 NOTE — Telephone Encounter (Signed)
 Pt Last seen 04/10/2023 Upcoming Appointment 05/08/2024  Adderall Last filled 03/19/2024 Escript 04/18/2024

## 2024-05-08 ENCOUNTER — Ambulatory Visit: Payer: PPO | Admitting: Neurology

## 2024-05-08 ENCOUNTER — Encounter: Payer: Self-pay | Admitting: Neurology

## 2024-05-08 VITALS — BP 110/69 | HR 80 | Ht 66.0 in | Wt 188.5 lb

## 2024-05-08 DIAGNOSIS — Z79899 Other long term (current) drug therapy: Secondary | ICD-10-CM

## 2024-05-08 DIAGNOSIS — G35 Multiple sclerosis: Secondary | ICD-10-CM

## 2024-05-08 DIAGNOSIS — G4700 Insomnia, unspecified: Secondary | ICD-10-CM

## 2024-05-08 DIAGNOSIS — R5382 Chronic fatigue, unspecified: Secondary | ICD-10-CM

## 2024-05-08 DIAGNOSIS — R208 Other disturbances of skin sensation: Secondary | ICD-10-CM

## 2024-05-08 DIAGNOSIS — R269 Unspecified abnormalities of gait and mobility: Secondary | ICD-10-CM | POA: Diagnosis not present

## 2024-05-08 DIAGNOSIS — E559 Vitamin D deficiency, unspecified: Secondary | ICD-10-CM

## 2024-05-08 DIAGNOSIS — F32A Depression, unspecified: Secondary | ICD-10-CM

## 2024-05-08 MED ORDER — PREDNISONE 10 MG (21) PO TBPK: ORAL_TABLET | ORAL | 0 refills | Status: DC

## 2024-05-08 NOTE — Progress Notes (Signed)
 [facility]  PATIENT: Veronica Frazier DOB: 04-11-1966  REFERRING CLINICIAN: Truman Economy HISTORY FROM: patient REASON FOR VISIT: MS   HISTORICAL  CHIEF COMPLAINT:  Chief Complaint  Patient presents with   Follow-up    Pt in room 11.alone. Here for MS follow up. Pt reports fatigue and shoulder and hand tightness worsened in past month. Pt interested in getting in infusion. Pt report more frequent falls. Last eye exam was last month,    HISTORY OF PRESENT ILLNESS:  Veronica Frazier is a 58 y.o. woman with relapsing remitting MS and related symptoms.    Update 05/08/2024: is on Tecfidera  and her last lymphocyte count was normal at 1.5.  No definite exacerbations.  She is on Tecfidera .   No recent flushing or stomach upset.SABRA        Lymphocytes were 1.8 04/2023.     She notes more issues.  She has had a couple falls related to balance.    No injury.     She sometimes veers to the left and sometimes hits the door frames.   Legs are strong.    She notes hand numbness and tingling and usually notes more in colder weather.    Gabapentin  helps.     She is onl taking flexeril  rarely now/    MRI brain 5/102023 showed moderate white matter foci, no change and no acute lesions.   Some foci are periventricular while others are more in the deep white matter.  MRI likely combination of demyelination and chronic microvascular ischemic change.    She note daily fatigue..   She does worse in heat and does best in spring and fall.   Modafinil  and Adderall helps fatigue.    She notes mild cognitive issues with decreased memory and word finding issues at times.  On lorazepam  for anxiety and also helps spasms and insomna.     She has vascular risks:  h/o tobacco and HTN (better after weight loss with Ozempic -- 60 pounds).  She has gained 30 pounds back after the medication coverage was lost.        MS History:   She presented in 2000  with pain and tingling in the left arm. At  that time, she had an MRI of the brain performed that was concerning for multiple sclerosis. Additionally, she had a lumbar puncture performed with CSF that was consistent with MS. Initially, she was placed on Avonex injections weekly. Initially, she did well with the Avonex but then had an MRI that showed some progression. About 2014 or 2015,  she switched to Tecfidera .    MRI of the brain 09/01/2017 showed multiple T2/FLAIR hyperintense foci in the periventricular, subcortical and deep white matter of both hemispheres and in the pons. Although most of the foci are nonspecific, some are radially oriented to the ventricles and others are in the juxtacortical white matter, consistent with chronic demyelinating plaque associated with multiple sclerosis. None of the foci appears to be acute and they do not enhance.   REVIEW OF SYSTEMS:  Constitutional: No fevers, chills, sweats, or change in appetite   She has fatigue that has worsened Eyes: Blurry vision and rare diplopia.  No eye pain Ear, nose and throat: No hearing loss, ear pain, nasal congestion, sore throat Cardiovascular: No chest pain, palpitations Respiratory:  No shortness of breath at rest or with exertion.   No wheezes   She snores GastrointestinaI: No nausea but recent diarrhea and constipation.    Colitis  being treated Genitourinary:  No dysuria, urinary retention or frequency.  No nocturia. Musculoskeletal:  No neck pain.  Mild axial back pain.   Right arm pain Integumentary: No rash, pruritus, skin lesions Neurological: as above Psychiatric:  She has some depression Endocrine: No palpitations, diaphoresis.     Hematologic/Lymphatic:  No anemia, purpura, petechiae. Allergic/Immunologic: No itchy/runny eyes, nasal congestion, recent allergic reactions, rashes  ALLERGIES: No Known Allergies  HOME MEDICATIONS: Outpatient Medications Prior to Visit  Medication Sig Dispense Refill   amphetamine -dextroamphetamine  (ADDERALL) 15 MG  tablet Take 1 tablet by mouth 2 (two) times daily. 60 tablet 0   ASPIRIN 81 PO Take 1 tablet by mouth daily.     aspirin-acetaminophen -caffeine (EXCEDRIN MIGRAINE) 250-250-65 MG per tablet Take 1 tablet by mouth 3 (three) times daily as needed.     cyclobenzaprine  (FLEXERIL ) 5 MG tablet Take 1 tablet (5 mg total) by mouth at bedtime. 90 tablet 3   DULoxetine  (CYMBALTA ) 60 MG capsule TAKE ONE CAPSULE BY MOUTH DAILY 30 capsule 1   FLUoxetine  (PROZAC ) 40 MG capsule Take 1 capsule (40 mg total) by mouth daily. 90 capsule 0   gabapentin  (NEURONTIN ) 300 MG capsule Take 1 capsule (300 mg total) by mouth 4 (four) times daily. 360 capsule 1   LORazepam  (ATIVAN ) 1 MG tablet TAKE ONE TABLET BY MOUTH EVERY NIGHT AT BEDTIME 30 tablet 5   LOSARTAN POTASSIUM PO Take 0.5 tablets by mouth daily.      meloxicam  (MOBIC ) 15 MG tablet TAKE ONE TABLET BY MOUTH EVERY DAY 30 tablet 0   modafinil  (PROVIGIL ) 200 MG tablet TAKE 1/2 TABLET BY MOUTH 3 TIMES DAILY 135 tablet 1   Multiple Vitamins-Calcium (ONE-A-DAY WOMENS FORMULA PO) Take 1 tablet by mouth daily.     omeprazole (PRILOSEC) 20 MG capsule      TECFIDERA  240 MG CPDR Take 1 capsule by mouth twice daily at 10am and 5pm. 180 capsule 0   VITAMIN D  PO Take by mouth.     amphetamine -dextroamphetamine  (ADDERALL) 15 MG tablet TAKE ONE TABLET BY MOUTH TWICE DAILY (Patient not taking: Reported on 05/08/2024) 60 tablet 0   atorvastatin (LIPITOR) 40 MG tablet TAKE 1 TABLET BY MOUTH ONCE DAILY AT NIGHT (Patient not taking: Reported on 05/08/2024)  3   hydrochlorothiazide (HYDRODIURIL) 12.5 MG tablet Take 12.5 mg by mouth daily. (Patient not taking: Reported on 05/08/2024)     No facility-administered medications prior to visit.    PAST MEDICAL HISTORY: Past Medical History:  Diagnosis Date   Hypertension    Multiple sclerosis (HCC)    Neuropathy    Stroke (HCC)    Vision abnormalities     PAST SURGICAL HISTORY: Past Surgical History:  Procedure Laterality Date    ABDOMINAL HYSTERECTOMY     APPENDECTOMY     BREAST SURGERY     CHOLECYSTECTOMY     TONSILLECTOMY      FAMILY HISTORY: Family History  Problem Relation Age of Onset   Hypertension Mother    Hyperlipidemia Mother    Colitis Mother    Hypertension Father     SOCIAL HISTORY:  Social History   Socioeconomic History   Marital status: Divorced    Spouse name: Not on file   Number of children: Not on file   Years of education: Not on file   Highest education level: Not on file  Occupational History   Not on file  Tobacco Use   Smoking status: Former    Types: Cigarettes   Smokeless  tobacco: Never  Vaping Use   Vaping status: Every Day  Substance and Sexual Activity   Alcohol  use: No    Alcohol /week: 0.0 standard drinks of alcohol    Drug use: No   Sexual activity: Not on file  Other Topics Concern   Not on file  Social History Narrative   Right handed    Social Drivers of Health   Financial Resource Strain: Not on file  Food Insecurity: Not on file  Transportation Needs: Not on file  Physical Activity: Not on file  Stress: Not on file  Social Connections: Unknown (08/27/2022)   [facility]   Social Network    Social Network: Not on file  Intimate Partner Violence: Unknown (08/27/2022)   Received from [facility]   HITS    Physically Hurt: Not on file    Insult or Talk Down To: Not on file    Threaten Physical Harm: Not on file    Scream or Curse: Not on file     PHYSICAL EXAM  Vitals:   05/08/24 0956  BP: 110/69  Pulse: 80  Weight: 188 lb 8 oz (85.5 kg)  Height: 5' 6 (1.676 m)    Body mass index is 30.42 kg/m.   General: The patient is well-developed and well-nourished and in no acute distress.      Neurologic Exam  Mental status: The patient is alert and oriented x 3 at the time of the examination. The patient has apparent normal recent and remote memory, with an apparently normal attention span and concentration ability.    Speech is normal.  Cranial nerves: Extraocular movements are full.   Facial strength and sensation was normal.  Trapezius strength was normal.  Motor:  Muscle bulk and tone are normal.  Strength is 5/5  Sensory: Intact sensation to touch and vibration in the arms and legs.  Coordination: Cerebellar testing shows normal finger-nose-finger..  Gait and station: Station is normal.  Gait is slightly wide.  Tandem gait is moderately wide.   Romberg is negative  Reflexes: Deep tendon reflexes are symmetric and normal bilaterally.     DIAGNOSTIC DATA (LABS, IMAGING, TESTING) - I reviewed patient records, labs, notes, testing and imaging myself where available.  Lab Results  Component Value Date   WBC 3.2 (L) 04/10/2023   HGB 12.9 04/10/2023   HCT 39.1 04/10/2023   MCV 98 (H) 04/10/2023   PLT 202 04/10/2023      Component Value Date/Time   NA 139 09/19/2022 1437   K 5.2 09/19/2022 1437   CL 99 09/19/2022 1437   CO2 27 09/19/2022 1437   GLUCOSE 104 (H) 09/19/2022 1437   GLUCOSE 123 (H) 02/20/2022 1203   BUN 21 09/19/2022 1437   CREATININE 0.86 09/19/2022 1437   CALCIUM 9.8 09/19/2022 1437   PROT 6.6 04/10/2023 0907   ALBUMIN 4.5 04/10/2023 0907   AST 24 04/10/2023 0907   ALT 27 04/10/2023 0907   ALKPHOS 82 04/10/2023 0907   BILITOT 0.5 04/10/2023 0907   GFRNONAA >60 02/20/2022 1203   GFRAA 95 03/11/2020 1409       ASSESSMENT AND PLAN    1. High risk medication use   2. Multiple sclerosis (HCC)   3. Gait disturbance   4. Chronic fatigue   5. Insomnia, unspecified type   6. Dysesthesia   7. Vitamin D  deficiency   8. Depression, unspecified depression type     1.   Continue Tecfidera . Check CBC with diff  Fatigue  is worse.  I will send in a steroid pack as that has helped fatigue a while in past.   2.   Continue Adderall and Provigil  for MS fatigue.    3.   Stay active and exercise.  Try to keep weight off 4.   I filled out her form for property tax   exclusion/discount for disability.   She will return to see me in 6 months or sooner if she has new or worsening symptoms.  This visit is part of a comprehensive longitudinal care medical relationship regarding the patients primary diagnosis of MS and related concerns.   Ellery Tash A. Vear, MD, PhD 05/08/2024, 10:28 AM Certified in Neurology, Clinical Neurophysiology, Sleep Medicine, Pain Medicine and Neuroimaging  [facility] [address] [place], [place] [address] [phone]

## 2024-05-09 ENCOUNTER — Ambulatory Visit: Payer: Self-pay | Admitting: Neurology

## 2024-05-09 LAB — COMPREHENSIVE METABOLIC PANEL WITH GFR
ALT: 22 IU/L (ref 0–32)
AST: 21 IU/L (ref 0–40)
Albumin: 4.5 g/dL (ref 3.8–4.9)
Alkaline Phosphatase: 102 IU/L (ref 44–121)
BUN/Creatinine Ratio: 26 — ABNORMAL HIGH (ref 9–23)
BUN: 19 mg/dL (ref 6–24)
Bilirubin Total: 0.6 mg/dL (ref 0.0–1.2)
CO2: 23 mmol/L (ref 20–29)
Calcium: 9.7 mg/dL (ref 8.7–10.2)
Chloride: 102 mmol/L (ref 96–106)
Creatinine, Ser: 0.73 mg/dL (ref 0.57–1.00)
Globulin, Total: 2.2 g/dL (ref 1.5–4.5)
Glucose: 117 mg/dL — ABNORMAL HIGH (ref 70–99)
Potassium: 4.2 mmol/L (ref 3.5–5.2)
Sodium: 140 mmol/L (ref 134–144)
Total Protein: 6.7 g/dL (ref 6.0–8.5)
eGFR: 95 mL/min/1.73 (ref >59)

## 2024-05-09 LAB — CBC WITH DIFFERENTIAL/PLATELET
Basophils Absolute: 0.1 x10E3/uL (ref 0.0–0.2)
Basos: 1 %
EOS (ABSOLUTE): 0.1 x10E3/uL (ref 0.0–0.4)
Eos: 2 %
Hematocrit: 40.4 % (ref 34.0–46.6)
Hemoglobin: 12.8 g/dL (ref 11.1–15.9)
Immature Grans (Abs): 0 x10E3/uL (ref 0.0–0.1)
Immature Granulocytes: 0 %
Lymphocytes Absolute: 1.2 x10E3/uL (ref 0.7–3.1)
Lymphs: 26 %
MCH: 32.2 pg (ref 26.6–33.0)
MCHC: 31.7 g/dL (ref 31.5–35.7)
MCV: 102 fL — ABNORMAL HIGH (ref 79–97)
Monocytes Absolute: 0.4 x10E3/uL (ref 0.1–0.9)
Monocytes: 8 %
Neutrophils Absolute: 2.9 x10E3/uL (ref 1.4–7.0)
Neutrophils: 63 %
Platelets: 232 x10E3/uL (ref 150–450)
RBC: 3.97 x10E6/uL (ref 3.77–5.28)
RDW: 12.7 % (ref 11.7–15.4)
WBC: 4.6 x10E3/uL (ref 3.4–10.8)

## 2024-05-16 ENCOUNTER — Other Ambulatory Visit: Payer: Self-pay | Admitting: Neurology

## 2024-05-16 DIAGNOSIS — G4700 Insomnia, unspecified: Secondary | ICD-10-CM

## 2024-05-16 NOTE — Telephone Encounter (Signed)
 Last seen 05/08/24 and next f/u 12/02/2024. Last refilled 04/18/24 #60.

## 2024-06-19 ENCOUNTER — Other Ambulatory Visit: Payer: Self-pay | Admitting: Neurology

## 2024-06-19 DIAGNOSIS — G4700 Insomnia, unspecified: Secondary | ICD-10-CM

## 2024-06-21 ENCOUNTER — Other Ambulatory Visit: Payer: Self-pay | Admitting: Neurology

## 2024-06-25 NOTE — Telephone Encounter (Signed)
 Last seen on 05/08/24 Follow up scheduled on 12/02/24

## 2024-06-28 ENCOUNTER — Other Ambulatory Visit: Payer: Self-pay | Admitting: Neurology

## 2024-06-28 NOTE — Telephone Encounter (Signed)
 Last seen on 05/08/24 Follow up scheduled on 12/02/24   Dispensed Days Supply Quantity Provider Pharmacy  MODAFINIL  200 MG TABS 04/09/2024 90 135 tablet Lomax, Amy, NP [facility]   Rx pending to be signed

## 2024-07-08 ENCOUNTER — Other Ambulatory Visit: Payer: Self-pay | Admitting: Neurology

## 2024-07-08 DIAGNOSIS — G35 Multiple sclerosis: Secondary | ICD-10-CM

## 2024-07-09 NOTE — Telephone Encounter (Signed)
 Last seen on 05/08/24 Follow up scheduled on 12/02/24

## 2024-07-16 ENCOUNTER — Other Ambulatory Visit: Payer: Self-pay | Admitting: Neurology

## 2024-07-16 DIAGNOSIS — G4700 Insomnia, unspecified: Secondary | ICD-10-CM

## 2024-07-17 NOTE — Telephone Encounter (Signed)
 Dr. Lossie you are work in provider  Last seen on 05/08/24 Follow up scheduled on 12/02/24   Dispensed Days Supply Quantity Provider Pharmacy  AMPHETAMINE -DEXTROAMPHETAMINE  15 MG AMPHETAMINE -DEXTROAMPHETAMINE  15 MG  TABS 06/20/2024 30 60 tablet Sater, Charlie LABOR, MD [facility]   Rx pending to signed

## 2024-08-18 ENCOUNTER — Other Ambulatory Visit: Payer: Self-pay | Admitting: Diagnostic Neuroimaging

## 2024-08-18 DIAGNOSIS — G4700 Insomnia, unspecified: Secondary | ICD-10-CM

## 2024-08-20 NOTE — Telephone Encounter (Signed)
 Last filled by patient on 07/18/24 Last office visit : 05/08/24 Next office visit : 12/02/24

## 2024-08-20 NOTE — Telephone Encounter (Signed)
 Pt called to follow up abut Medication refill  amphetamine -dextroamphetamine  (ADDERALL) 15 MG tablet  Pt medication is to be sent to :   [facility] [place], [place] - [address] (Ph: [phone])

## 2024-08-20 NOTE — Telephone Encounter (Signed)
 LOV 05/08/24  Next OV 12/02/24  Last refill 07/17/24, #60, 0 refills  Please review, thanks!

## 2024-08-29 ENCOUNTER — Other Ambulatory Visit: Payer: Self-pay | Admitting: Neurology

## 2024-08-29 NOTE — Telephone Encounter (Signed)
 Last seen on 05/08/24 Follow up scheduled on 12/02/24   Dispensed Days Supply Quantity Provider Pharmacy  LORAZEPAM  1 MG TABS 07/30/2024 30 30 tablet Sater, Charlie LABOR, MD [facility]      Rx pending to be signed

## 2024-09-14 ENCOUNTER — Other Ambulatory Visit: Payer: Self-pay

## 2024-09-14 ENCOUNTER — Ambulatory Visit
Admission: RE | Admit: 2024-09-14 | Discharge: 2024-09-14 | Disposition: A | Discharge status: Home / Self Care | Source: Ambulatory Visit | Attending: Family Medicine | Admitting: Family Medicine

## 2024-09-14 ENCOUNTER — Ambulatory Visit

## 2024-09-14 VITALS — BP 133/80 | HR 77 | Temp 98.3°F | Resp 17

## 2024-09-14 DIAGNOSIS — S62613A Displaced fracture of proximal phalanx of left middle finger, initial encounter for closed fracture: Secondary | ICD-10-CM

## 2024-09-14 DIAGNOSIS — S67193A Crushing injury of left middle finger, initial encounter: Secondary | ICD-10-CM | POA: Diagnosis not present

## 2024-09-14 DIAGNOSIS — S6992XA Unspecified injury of left wrist, hand and finger(s), initial encounter: Secondary | ICD-10-CM | POA: Diagnosis not present

## 2024-09-14 DIAGNOSIS — S62633A Displaced fracture of distal phalanx of left middle finger, initial encounter for closed fracture: Secondary | ICD-10-CM | POA: Diagnosis not present

## 2024-09-14 MED ORDER — CELECOXIB 200 MG PO CAPS: 200.0000 mg | ORAL_CAPSULE | Freq: Every day | ORAL | 0 refills | Status: AC

## 2024-09-14 NOTE — ED Provider Notes (Signed)
 [facility]    CSN: [id number] Arrival date & time: 09/14/24  1403      History   Chief Complaint Chief Complaint  Patient presents with   Finger Injury    LT middle    HPI Veronica Frazier is a 58 y.o. female.   HPI 58 year old female presents with left middle finger injury sustained yesterday when accidentally slamming in car door.  PMH significant for stroke, MS, and hypertension.  Past Medical History:  Diagnosis Date   Hypertension    Multiple sclerosis    Neuropathy    Stroke [facility])    Vision abnormalities     Patient Active Problem List   Diagnosis Date Noted   High risk medication use 08/10/2020   White matter abnormality on MRI of brain 09/11/2017   Gait disturbance 10/13/2015   Disturbed cognition 04/09/2015   Right arm pain 04/09/2015   Multiple sclerosis, relapsing-remitting 12/09/2014   Fatigue 12/09/2014   Depression 12/09/2014   Insomnia 12/09/2014   Visual disturbance 12/09/2014   CFIDS (chronic fatigue and immune dysfunction syndrome) 12/09/2014   Essential (primary) hypertension 09/03/2014   HLD (hyperlipidemia) 04/21/2014   Encounter for general adult medical examination without abnormal findings 04/18/2014   Multiple sclerosis 04/06/2013    Past Surgical History:  Procedure Laterality Date   ABDOMINAL HYSTERECTOMY     APPENDECTOMY     BREAST SURGERY     CHOLECYSTECTOMY     TONSILLECTOMY      OB History   No obstetric history on file.      Home Medications    Prior to Admission medications   Medication Sig Start Date End Date Taking? Authorizing Provider  celecoxib (CELEBREX) 200 MG capsule Take 1 capsule (200 mg total) by mouth daily for 15 days. 09/14/24 09/29/24 Yes Teddy Sharper, FNP  amphetamine -dextroamphetamine  (ADDERALL) 15 MG tablet TAKE ONE TABLET BY MOUTH TWICE DAILY Patient not taking: Reported on 05/08/2024 04/18/24   Sater, Charlie LABOR, MD  amphetamine -dextroamphetamine  (ADDERALL) 15 MG tablet TAKE ONE  TABLET BY MOUTH TWICE DAILY 08/20/24   Sater, Charlie LABOR, MD  ASPIRIN 81 PO Take 1 tablet by mouth daily.    [provider]  aspirin-acetaminophen -caffeine (EXCEDRIN MIGRAINE) 250-250-65 MG per tablet Take 1 tablet by mouth 3 (three) times daily as needed.    [provider]  atorvastatin (LIPITOR) 40 MG tablet TAKE 1 TABLET BY MOUTH ONCE DAILY AT NIGHT Patient not taking: Reported on 05/08/2024 04/10/18   [provider]  cyclobenzaprine  (FLEXERIL ) 5 MG tablet Take 1 tablet (5 mg total) by mouth at bedtime. 09/19/22   Sater, Charlie LABOR, MD  DULoxetine  (CYMBALTA ) 60 MG capsule TAKE ONE CAPSULE BY MOUTH DAILY 07/17/24   Sater, Charlie LABOR, MD  FLUoxetine  (PROZAC ) 40 MG capsule TAKE 1 CAPSULE (40 MG TOTAL) BY MOUTH DAILY. 06/28/24   Sater, Charlie LABOR, MD  gabapentin  (NEURONTIN ) 300 MG capsule TAKE 1 CAPSULE BY MOUTH 4 TIMES DAILY 07/09/24   Sater, Charlie LABOR, MD  hydrochlorothiazide (HYDRODIURIL) 12.5 MG tablet Take 12.5 mg by mouth daily. Patient not taking: Reported on 05/08/2024 12/04/19   [provider]  LORazepam  (ATIVAN ) 1 MG tablet TAKE 1 TABLET BY MOUTH AT BEDTIME 08/29/24   Sater, Charlie LABOR, MD  LOSARTAN POTASSIUM PO Take 0.5 tablets by mouth daily.     [provider]  meloxicam  (MOBIC ) 15 MG tablet TAKE ONE TABLET BY MOUTH EVERY DAY 06/25/24   Sater, Charlie LABOR, MD  modafinil  (PROVIGIL ) 200 MG tablet  TAKE 1/2 TABLET BY MOUTH 3 TIMES DAILY 06/28/24   Sater, Charlie LABOR, MD  Multiple Vitamins-Calcium (ONE-A-DAY WOMENS FORMULA PO) Take 1 tablet by mouth daily.    [provider]  omeprazole (PRILOSEC) 20 MG capsule  03/02/15   [provider]  predniSONE  (STERAPRED UNI-PAK 21 TAB) 10 MG (21) TBPK tablet Taper from 6 pills po on day to one pill po on day 6 05/08/24   Sater, Charlie LABOR, MD  TECFIDERA  240 MG CPDR Take 1 capsule by mouth twice daily at 10am and 5pm. 04/09/24   Sater, Charlie LABOR, MD  VITAMIN D  PO Take by mouth.    [provider]     Family History Family History  Problem Relation Age of Onset   Hypertension Mother    Hyperlipidemia Mother    Colitis Mother    Hypertension Father     Social History Social History   Tobacco Use   Smoking status: Former    Types: Cigarettes   Smokeless tobacco: Never  Vaping Use   Vaping status: Every Day  Substance Use Topics   Alcohol  use: No    Alcohol /week: 0.0 standard drinks of alcohol    Drug use: No     Allergies   Patient has no known allergies.   Review of Systems Review of Systems  Musculoskeletal:        Left middle finger pain secondary to left middle finger injury sustained yesterday when accidentally slammed in car door     Physical Exam Triage Vital Signs ED Triage Vitals  Encounter Vitals Group     BP      Girls Systolic BP Percentile      Girls Diastolic BP Percentile      Boys Systolic BP Percentile      Boys Diastolic BP Percentile      Pulse      Resp      Temp      Temp src      SpO2      Weight      Height      Head Circumference      Peak Flow      Pain Score      Pain Loc      Pain Education      Exclude from Growth Chart    No data found.  Updated Vital Signs BP 133/80 (BP Location: Right Arm)   Pulse 77   Temp 98.3 F (36.8 C) (Oral)   Resp 17   SpO2 99%    Physical Exam Vitals and nursing note reviewed.  Constitutional:      Appearance: Normal appearance. She is obese.  HENT:     Head: Normocephalic and atraumatic.     Mouth/Throat:     Mouth: Mucous membranes are moist.     Pharynx: Oropharynx is clear.  Eyes:     Extraocular Movements: Extraocular movements intact.     Conjunctiva/sclera: Conjunctivae normal.     Pupils: Pupils are equal, round, and reactive to light.  Cardiovascular:     Heart sounds: Normal heart sounds.  Pulmonary:     Effort: Pulmonary effort is normal.     Breath sounds: Normal breath sounds. No wheezing, rhonchi or rales.  Musculoskeletal:        General: Normal range  of motion.     Comments: Left middle finger (dorsum) deformity noted over proximal distal phalanx please see image below  Skin:    General: Skin is warm and  dry.  Neurological:     General: No focal deficit present.     Mental Status: She is alert and oriented to person, place, and time. Mental status is at baseline.  Psychiatric:        Mood and Affect: Mood normal.        Behavior: Behavior normal.      UC Treatments / Results  Labs (all labs ordered are listed, but only abnormal results are displayed) Labs Reviewed - No data to display  EKG   Radiology DG Finger Middle Left Result Date: 09/14/2024 CLINICAL DATA:  Slammed left middle finger in car door yesterday. EXAM: LEFT MIDDLE FINGER 2+V COMPARISON:  None Available. FINDINGS: Moderate to severe degenerative changes are present at the proximal and distal interphalangeal joints. There lucencies in osteophytes along the dorsal aspect of the proximal and distal osteophytes, possible nondisplaced fractures. A bony density is present at the dorsal aspect of the tuft of the distal phalanx. No dislocation is seen. There is diffuse soft tissue swelling about the third digit. IMPRESSION: 1. Moderate to severe degenerative changes at the interphalangeal joints with suspected nondisplaced fractures involving the osteophytes of the proximal and distal interphalangeal joints. 2. Bony density at the dorsal aspect of the tuft of the distal phalanx suggesting avulsion fracture. Electronically Signed   By: Leita Birmingham M.D.   On: 09/14/2024 14:47    Procedures Procedures (including critical care time)  Medications Ordered in UC Medications - No data to display  Initial Impression / Assessment and Plan / UC Course  I have reviewed the triage vital signs and the nursing notes.  Pertinent labs & imaging results that were available during my care of the patient were reviewed by me and considered in my medical decision making (see chart for  details).     MDM: 1.  Displaced fracture of distal phalanx of left little finger, initial encounter for closed fracture-DG finger middle left revealed above; 2.  Displaced fracture of proximal phalanx of left middle finger, initial encounter for closed fracture-DG finger middle left revealed above, Rx'd Celebrex; 3.  Injury of left middle finger, initial encounter-left middle finger brace placed on patient prior to discharge with specific instructions for [facility] orthopedic follow-up. Advised patient to discontinue or hold Mobic  while taking Celebrex.  Advised patient of left middle proximal and distal phalanx fractures with image and hardcopy provided.  Advised patient take medication as directed with food to completion for advised patient to wear left middle finger splint 24/7 until being evaluated by [facility].  Advised patient take medication as directed with food to completion.  Encouraged to increase daily water intake to 64 ounces per day while taking this medication.  Patient discharged home, hemodynamically stable Final Clinical Impressions(s) / UC Diagnoses   Final diagnoses:  Injury of left middle finger, initial encounter  Displaced fracture of distal phalanx of left middle finger, initial encounter for closed fracture  Displaced fracture of proximal phalanx of left middle finger, initial encounter for closed fracture     Discharge Instructions      Advised patient to discontinue or hold Mobic  while taking Celebrex.  Advised patient of left middle proximal and distal phalanx fractures with image and hardcopy provided.  Advised patient take medication as directed with food to completion for advised patient to wear left middle finger splint 24/7 until being evaluated by [facility] orthopedics.  Advised patient take medication as directed with food to completion.  Encouraged to increase daily  water intake to 64 ounces per day while taking this medication.     ED  Prescriptions     Medication Sig Dispense Auth. Provider   celecoxib (CELEBREX) 200 MG capsule Take 1 capsule (200 mg total) by mouth daily for 15 days. 15 capsule Charlies Rayburn, FNP      I have reviewed the PDMP during this encounter.   Teddy Sharper, FNP 09/14/24 1521

## 2024-09-14 NOTE — Discharge Instructions (Addendum)
 Advised patient to discontinue or hold Mobic  while taking Celebrex.  Advised patient of left middle proximal and distal phalanx fractures with image and hardcopy provided.  Advised patient take medication as directed with food to completion for advised patient to wear left middle finger splint 24/7 until being evaluated by [facility] orthopedics.  Advised patient take medication as directed with food to completion.  Encouraged to increase daily water intake to 64 ounces per day while taking this medication.

## 2024-09-14 NOTE — ED Triage Notes (Signed)
 Pt c/o LT middle finger injury since yesterday. Injured by slamming in car door. Ice prn. Swelling and bruising noted.

## 2024-09-23 ENCOUNTER — Other Ambulatory Visit: Payer: Self-pay | Admitting: Neurology

## 2024-09-23 DIAGNOSIS — G4700 Insomnia, unspecified: Secondary | ICD-10-CM

## 2024-09-23 NOTE — Telephone Encounter (Signed)
 Pt called to request medication refill    amphetamine -dextroamphetamine  (ADDERALL) 15 MG tablet   Pt would like medication to be sent to   [facility] [place], [place] - [address] (Ph: [phone])

## 2024-10-08 ENCOUNTER — Other Ambulatory Visit: Payer: Self-pay

## 2024-10-08 DIAGNOSIS — G35A Relapsing-remitting multiple sclerosis: Secondary | ICD-10-CM

## 2024-10-08 MED ORDER — TECFIDERA 240 MG PO CPDR: DELAYED_RELEASE_CAPSULE | ORAL | 2 refills | Status: AC

## 2024-10-19 ENCOUNTER — Ambulatory Visit
Admission: EM | Admit: 2024-10-19 | Discharge: 2024-10-19 | Disposition: A | Discharge status: Home / Self Care | Attending: Family Medicine | Admitting: Family Medicine

## 2024-10-19 ENCOUNTER — Encounter: Payer: Self-pay | Admitting: Emergency Medicine

## 2024-10-19 DIAGNOSIS — R059 Cough, unspecified: Secondary | ICD-10-CM

## 2024-10-19 DIAGNOSIS — J069 Acute upper respiratory infection, unspecified: Secondary | ICD-10-CM

## 2024-10-19 DIAGNOSIS — Z20828 Contact with and (suspected) exposure to other viral communicable diseases: Secondary | ICD-10-CM

## 2024-10-19 LAB — POCT INFLUENZA A/B
Influenza A, POC: NEGATIVE
Influenza B, POC: NEGATIVE

## 2024-10-19 LAB — POC SOFIA SARS ANTIGEN FIA: SARS Coronavirus 2 Ag: NEGATIVE

## 2024-10-19 MED ORDER — AZITHROMYCIN 250 MG PO TABS: 250.0000 mg | ORAL_TABLET | Freq: Every day | ORAL | 0 refills | Status: AC

## 2024-10-19 MED ORDER — PROMETHAZINE-DM 6.25-15 MG/5ML PO SYRP: 5.0000 mL | ORAL_SOLUTION | Freq: Two times a day (BID) | ORAL | 0 refills | Status: AC | PRN

## 2024-10-19 MED ORDER — PREDNISONE 20 MG PO TABS: ORAL_TABLET | ORAL | 0 refills | Status: AC

## 2024-10-19 NOTE — ED Provider Notes (Signed)
 " Veronica Frazier    CSN: [id number] Arrival date & time: 10/19/24  1344      History   Chief Complaint Chief Complaint  Patient presents with   Cough    HPI Veronica Frazier is a 58 y.o. female.   HPI 58 year old female presents with cough for 6 days and reports exposed to influenza with family sick.  PMH significant for multiple sclerosis, HTN, and disturbed cognition.  Past Medical History:  Diagnosis Date   Hypertension    Multiple sclerosis    Neuropathy    Stroke [facility])    Vision abnormalities     Patient Active Problem List   Diagnosis Date Noted   High risk medication use 08/10/2020   White matter abnormality on MRI of brain 09/11/2017   Gait disturbance 10/13/2015   Disturbed cognition 04/09/2015   Right arm pain 04/09/2015   Multiple sclerosis, relapsing-remitting 12/09/2014   Fatigue 12/09/2014   Depression 12/09/2014   Insomnia 12/09/2014   Visual disturbance 12/09/2014   CFIDS (chronic fatigue and immune dysfunction syndrome) 12/09/2014   Essential (primary) hypertension 09/03/2014   HLD (hyperlipidemia) 04/21/2014   Encounter for general adult medical examination without abnormal findings 04/18/2014   Multiple sclerosis 04/06/2013    Past Surgical History:  Procedure Laterality Date   ABDOMINAL HYSTERECTOMY     APPENDECTOMY     BREAST SURGERY     CHOLECYSTECTOMY     TONSILLECTOMY      OB History   No obstetric history on file.      Home Medications    Prior to Admission medications  Medication Sig Start Date End Date Taking? Authorizing Provider  ASPIRIN 81 PO Take 1 tablet by mouth daily.   Yes [provider]  aspirin-acetaminophen -caffeine (EXCEDRIN MIGRAINE) 250-250-65 MG per tablet Take 1 tablet by mouth 3 (three) times daily as needed.   Yes [provider]  azithromycin  (ZITHROMAX ) 250 MG tablet Take 1 tablet (250 mg total) by mouth daily. Take first 2 tablets together, then 1 every day until finished.  10/19/24  Yes Teddy Sharper, FNP  DULoxetine  (CYMBALTA ) 60 MG capsule TAKE ONE CAPSULE BY MOUTH DAILY 07/17/24  Yes Sater, Charlie LABOR, MD  FLUoxetine  (PROZAC ) 40 MG capsule TAKE 1 CAPSULE (40 MG TOTAL) BY MOUTH DAILY. 06/28/24  Yes Sater, Charlie LABOR, MD  gabapentin  (NEURONTIN ) 300 MG capsule TAKE 1 CAPSULE BY MOUTH 4 TIMES DAILY 07/09/24  Yes Sater, Charlie LABOR, MD  LORazepam  (ATIVAN ) 1 MG tablet TAKE 1 TABLET BY MOUTH AT BEDTIME 08/29/24  Yes Sater, Charlie LABOR, MD  meloxicam  (MOBIC ) 15 MG tablet TAKE ONE TABLET BY MOUTH EVERY DAY 06/25/24  Yes Sater, Charlie LABOR, MD  modafinil  (PROVIGIL ) 200 MG tablet TAKE 1/2 TABLET BY MOUTH 3 TIMES DAILY 06/28/24  Yes Sater, Charlie LABOR, MD  Multiple Vitamins-Calcium (ONE-A-DAY WOMENS FORMULA PO) Take 1 tablet by mouth daily.   Yes [provider]  omeprazole (PRILOSEC) 20 MG capsule  03/02/15  Yes [provider]  predniSONE  (DELTASONE ) 20 MG tablet Take 3 tabs PO daily x 5 days. 10/19/24  Yes France Noyce, FNP  promethazine -dextromethorphan (PROMETHAZINE -DM) 6.25-15 MG/5ML syrup Take 5 mLs by mouth 2 (two) times daily as needed for cough. 10/19/24  Yes Teddy Sharper, FNP  TECFIDERA  240 MG CPDR Take 1 capsule by mouth twice daily at 10am and 5pm. 10/08/24  Yes Sater, Charlie LABOR, MD  VITAMIN D  PO Take by mouth.   Yes [provider]  cyclobenzaprine  (FLEXERIL ) 5 MG  tablet Take 1 tablet (5 mg total) by mouth at bedtime. 09/19/22   Sater, Charlie LABOR, MD  hydrochlorothiazide (HYDRODIURIL) 12.5 MG tablet Take 12.5 mg by mouth daily. Patient not taking: Reported on 05/08/2024 12/04/19   [provider]  LOSARTAN POTASSIUM PO Take 0.5 tablets by mouth daily.     [provider]    Family History Family History  Problem Relation Age of Onset   Hypertension Mother    Hyperlipidemia Mother    Colitis Mother    Hypertension Father     Social History Social History[1]   Allergies   Patient has no known allergies.   Review of  Systems Review of Systems  Constitutional:  Positive for chills and fatigue.  HENT:  Positive for congestion.   Respiratory:  Positive for cough.   Musculoskeletal:  Positive for arthralgias and myalgias.     Physical Exam Triage Vital Signs ED Triage Vitals  Encounter Vitals Group     BP 10/19/24 1438 118/87     Girls Systolic BP Percentile --      Girls Diastolic BP Percentile --      Boys Systolic BP Percentile --      Boys Diastolic BP Percentile --      Pulse Rate 10/19/24 1438 85     Resp 10/19/24 1438 18     Temp 10/19/24 1438 98.5 F (36.9 C)     Temp Source 10/19/24 1438 Oral     SpO2 10/19/24 1438 96 %     Weight 10/19/24 1436 160 lb (72.6 kg)     Height 10/19/24 1436 5' 7 (1.702 m)     Head Circumference --      Peak Flow --      Pain Score 10/19/24 1436 0     Pain Loc --      Pain Education --      Exclude from Growth Chart --    No data found.  Updated Vital Signs BP 118/87 (BP Location: Right Arm)   Pulse 85   Temp 98.5 F (36.9 C) (Oral)   Resp 18   Ht 5' 7 (1.702 m)   Wt 160 lb (72.6 kg)   SpO2 96%   BMI 25.06 kg/m   Visual Acuity Right Eye Distance:   Left Eye Distance:   Bilateral Distance:    Right Eye Near:   Left Eye Near:    Bilateral Near:     Physical Exam Vitals and nursing note reviewed.  Constitutional:      Appearance: Normal appearance. She is obese. She is ill-appearing.  HENT:     Head: Normocephalic and atraumatic.     Right Ear: Tympanic membrane, ear canal and external ear normal.     Left Ear: Tympanic membrane, ear canal and external ear normal.     Mouth/Throat:     Mouth: Mucous membranes are moist.     Pharynx: Oropharynx is clear.  Eyes:     Extraocular Movements: Extraocular movements intact.     Conjunctiva/sclera: Conjunctivae normal.     Pupils: Pupils are equal, round, and reactive to light.  Cardiovascular:     Rate and Rhythm: Regular rhythm.     Pulses: Normal pulses.     Heart sounds: Normal  heart sounds.  Pulmonary:     Effort: Pulmonary effort is normal.     Breath sounds: Normal breath sounds. No wheezing, rhonchi or rales.     Comments: Frequent nonproductive cough on exam Musculoskeletal:  General: Normal range of motion.     Cervical back: Normal range of motion and neck supple.  Skin:    General: Skin is warm and dry.  Neurological:     General: No focal deficit present.     Mental Status: She is alert and oriented to person, place, and time.  Psychiatric:        Mood and Affect: Mood normal.        Behavior: Behavior normal.      UC Treatments / Results  Labs (all labs ordered are listed, but only abnormal results are displayed) Labs Reviewed  POC SOFIA SARS ANTIGEN FIA - Normal  POCT INFLUENZA A/B - Normal    EKG   Radiology No results found.  Procedures Procedures (including critical Frazier time)  Medications Ordered in UC Medications - No data to display  Initial Impression / Assessment and Plan / UC Course  I have reviewed the triage vital signs and the nursing notes.  Pertinent labs & imaging results that were available during my Frazier of the patient were reviewed by me and considered in my medical decision making (see chart for details).     MDM: 1.  Acute URI-Rx Zithromax : Take as directed; 2.  Cough, unspecified type-Rx'd prednisone  20 mg tablet: Take 3 tablets p.o. daily x 5 days, Rx'd Promethazine  DM 6.25-15 mg / 5 mL syrup: Take 5 mL twice daily, as needed for cough. Advised patient take medications as directed with food to completion.  Advised take prednisone  with Zithromax  daily for the next 5 days.  Advised may use Promethazine  DM cough syrup at night prior to sleep for cough due to sedative effects.  Encouraged increase daily water intake to 64 ounces per day while taking this medications.  Advised if symptoms worsen and/or unresolved please follow-up with PCP or here for further evaluation.  Patient discharged home,  hemodynamically stable. Final Clinical Impressions(s) / UC Diagnoses   Final diagnoses:  Exposure to influenza  Cough, unspecified type  Acute URI     Discharge Instructions      Advised patient take medications as directed with food to completion.  Advised take prednisone  with Zithromax  daily for the next 5 days.  Advised may use Promethazine  DM cough syrup at night prior to sleep for cough due to sedative effects.  Encouraged increase daily water intake to 64 ounces per day while taking this medications.  Advised if symptoms worsen and/or unresolved please follow-up with PCP or here for further evaluation.     ED Prescriptions     Medication Sig Dispense Auth. Provider   azithromycin  (ZITHROMAX ) 250 MG tablet Take 1 tablet (250 mg total) by mouth daily. Take first 2 tablets together, then 1 every day until finished. 6 tablet Jayzen Paver, FNP   predniSONE  (DELTASONE ) 20 MG tablet Take 3 tabs PO daily x 5 days. 15 tablet Tihanna Goodson, FNP   promethazine -dextromethorphan (PROMETHAZINE -DM) 6.25-15 MG/5ML syrup Take 5 mLs by mouth 2 (two) times daily as needed for cough. 118 mL Kadelyn Dimascio, FNP      PDMP not reviewed this encounter.    [1]  Social History Tobacco Use   Smoking status: Former    Types: Cigarettes   Smokeless tobacco: Never  Vaping Use   Vaping status: Every Day  Substance Use Topics   Alcohol  use: No    Alcohol /week: 0.0 standard drinks of alcohol    Drug use: No     Teddy Sharper, FNP 10/19/24 1511  "

## 2024-10-19 NOTE — ED Triage Notes (Signed)
 Patient c/o cough x 6 days, exposed to Influenza A.  Chest congestion and fatigue.  Patient has taken Nyquil, Dayquil and Mucinex.  Afebrile.

## 2024-10-19 NOTE — Discharge Instructions (Addendum)
 Advised patient take medications as directed with food to completion.  Advised take prednisone  with Zithromax  daily for the next 5 days.  Advised may use Promethazine  DM cough syrup at night prior to sleep for cough due to sedative effects.  Encouraged increase daily water intake to 64 ounces per day while taking this medications.  Advised if symptoms worsen and/or unresolved please follow-up with PCP or here for further evaluation.

## 2024-10-25 ENCOUNTER — Other Ambulatory Visit: Payer: Self-pay | Admitting: Neurology

## 2024-10-25 DIAGNOSIS — G4700 Insomnia, unspecified: Secondary | ICD-10-CM

## 2024-10-28 NOTE — Telephone Encounter (Signed)
 Last OV 05/08/24 Next OV 12/02/24  Last refill 09/23/24 Qty #60/0

## 2024-11-28 ENCOUNTER — Other Ambulatory Visit: Payer: Self-pay | Admitting: Neurology

## 2024-11-28 DIAGNOSIS — G4700 Insomnia, unspecified: Secondary | ICD-10-CM

## 2024-11-28 NOTE — Telephone Encounter (Signed)
 Last seen on 05/08/24 Follow up scheduled on 12/02/24   Dispensed Days Supply Quantity Provider Pharmacy  AMPHET/DEXTR TAB 15MG  10/28/2024 30 60 each Sater, Charlie LABOR, MD [facility]...        Rx pending to be signed

## 2024-12-01 ENCOUNTER — Telehealth: Payer: Self-pay | Admitting: Family Medicine

## 2024-12-01 NOTE — Telephone Encounter (Signed)
 Office will be closed on 2/2 due to the weather.

## 2024-12-02 ENCOUNTER — Ambulatory Visit: Admitting: Family Medicine
